# Patient Record
Sex: Female | Born: 1960 | Race: Black or African American | Hispanic: No | Marital: Single | Smoking: Never smoker
Health system: Southern US, Community
[De-identification: ages and names within clinical notes are randomized; demographics above are authoritative.]

## PROBLEM LIST (undated history)

## (undated) DIAGNOSIS — D649 Anemia, unspecified: Secondary | ICD-10-CM

## (undated) DIAGNOSIS — I10 Essential (primary) hypertension: Secondary | ICD-10-CM

## (undated) HISTORY — PX: DILATION AND CURETTAGE OF UTERUS: SHX78

---

## 2019-10-28 ENCOUNTER — Other Ambulatory Visit: Payer: Self-pay | Admitting: Gerontology

## 2019-10-28 DIAGNOSIS — Z1231 Encounter for screening mammogram for malignant neoplasm of breast: Secondary | ICD-10-CM

## 2021-07-20 ENCOUNTER — Emergency Department
Admission: EM | Admit: 2021-07-20 | Discharge: 2021-07-23 | Disposition: A | Discharge status: Home / Self Care | Payer: Medicare Other | Attending: Internal Medicine | Admitting: Internal Medicine

## 2021-07-20 DIAGNOSIS — N939 Abnormal uterine and vaginal bleeding, unspecified: Secondary | ICD-10-CM | POA: Diagnosis not present

## 2021-07-20 DIAGNOSIS — D259 Leiomyoma of uterus, unspecified: Secondary | ICD-10-CM | POA: Diagnosis present

## 2021-07-20 DIAGNOSIS — D649 Anemia, unspecified: Secondary | ICD-10-CM | POA: Diagnosis not present

## 2021-07-20 DIAGNOSIS — Z6841 Body Mass Index (BMI) 40.0 and over, adult: Secondary | ICD-10-CM

## 2021-07-20 DIAGNOSIS — N179 Acute kidney failure, unspecified: Secondary | ICD-10-CM

## 2021-07-20 DIAGNOSIS — I10 Essential (primary) hypertension: Secondary | ICD-10-CM | POA: Diagnosis not present

## 2021-07-20 DIAGNOSIS — R42 Dizziness and giddiness: Secondary | ICD-10-CM | POA: Diagnosis present

## 2021-07-20 DIAGNOSIS — E875 Hyperkalemia: Secondary | ICD-10-CM

## 2021-07-20 HISTORY — DX: Anemia, unspecified: D64.9

## 2021-07-20 HISTORY — DX: Essential (primary) hypertension: I10

## 2021-07-20 LAB — CBC WITH DIFFERENTIAL/PLATELET
Abs Immature Granulocytes: 0.05 10*3/uL (ref 0.00–0.07)
Abs Immature Granulocytes: 0.03 10*3/uL (ref 0.00–0.07)
Basophils Absolute: 0 10*3/uL (ref 0.0–0.1)
Basophils Absolute: 0 10*3/uL (ref 0.0–0.1)
Basophils Relative: 0 %
Basophils Relative: 0 %
Eosinophils Absolute: 0.1 10*3/uL (ref 0.0–0.5)
Eosinophils Absolute: 0.1 10*3/uL (ref 0.0–0.5)
Eosinophils Relative: 1 %
Eosinophils Relative: 1 %
HCT: 19.3 % — ABNORMAL LOW (ref 36.0–46.0)
HCT: 21.4 % — ABNORMAL LOW (ref 36.0–46.0)
Hemoglobin: 5.5 g/dL — ABNORMAL LOW (ref 12.0–15.0)
Hemoglobin: 6.3 g/dL — ABNORMAL LOW (ref 12.0–15.0)
Immature Granulocytes: 1 %
Immature Granulocytes: 0 %
Lymphocytes Relative: 15 %
Lymphocytes Relative: 21 %
Lymphs Abs: 1.4 10*3/uL (ref 0.7–4.0)
Lymphs Abs: 1.6 10*3/uL (ref 0.7–4.0)
MCH: 21 pg — ABNORMAL LOW (ref 26.0–34.0)
MCH: 22.3 pg — ABNORMAL LOW (ref 26.0–34.0)
MCHC: 28.5 g/dL — ABNORMAL LOW (ref 30.0–36.0)
MCHC: 29.4 g/dL — ABNORMAL LOW (ref 30.0–36.0)
MCV: 73.7 fL — ABNORMAL LOW (ref 80.0–100.0)
MCV: 75.6 fL — ABNORMAL LOW (ref 80.0–100.0)
Monocytes Absolute: 0.5 10*3/uL (ref 0.1–1.0)
Monocytes Absolute: 0.6 10*3/uL (ref 0.1–1.0)
Monocytes Relative: 6 %
Monocytes Relative: 8 %
Neutro Abs: 6.9 10*3/uL (ref 1.7–7.7)
Neutro Abs: 5.1 10*3/uL (ref 1.7–7.7)
Neutrophils Relative %: 77 %
Neutrophils Relative %: 70 %
Platelets: 217 10*3/uL (ref 150–400)
Platelets: 190 10*3/uL (ref 150–400)
RBC: 2.62 MIL/uL — ABNORMAL LOW (ref 3.87–5.11)
RBC: 2.83 MIL/uL — ABNORMAL LOW (ref 3.87–5.11)
RDW: 17.5 % — ABNORMAL HIGH (ref 11.5–15.5)
RDW: 18.6 % — ABNORMAL HIGH (ref 11.5–15.5)
WBC: 9 10*3/uL (ref 4.0–10.5)
WBC: 7.4 10*3/uL (ref 4.0–10.5)
nRBC: 1.7 % — ABNORMAL HIGH (ref 0.0–0.2)
nRBC: 2 % — ABNORMAL HIGH (ref 0.0–0.2)

## 2021-07-20 LAB — BASIC METABOLIC PANEL
Anion gap: 8 (ref 5–15)
BUN: 54 mg/dL — ABNORMAL HIGH (ref 6–20)
CO2: 20 mmol/L — ABNORMAL LOW (ref 22–32)
Calcium: 8.6 mg/dL — ABNORMAL LOW (ref 8.9–10.3)
Chloride: 108 mmol/L (ref 98–111)
Creatinine, Ser: 1.76 mg/dL — ABNORMAL HIGH (ref 0.44–1.00)
GFR, Estimated: 33 mL/min — ABNORMAL LOW (ref >60)
Glucose, Bld: 101 mg/dL — ABNORMAL HIGH (ref 70–99)
Potassium: 5.2 mmol/L — ABNORMAL HIGH (ref 3.5–5.1)
Sodium: 136 mmol/L (ref 135–145)

## 2021-07-20 LAB — PREPARE RBC (CROSSMATCH)

## 2021-07-20 LAB — ABO/RH: ABO/RH(D): O POS

## 2021-07-20 MED ORDER — SODIUM CHLORIDE 0.9 % IV SOLN
10.0000 mL/h | Freq: Once | INTRAVENOUS | Status: AC
Start: 2021-07-20 — End: 2021-07-20
  Administered 2021-07-20: 10 mL/h via INTRAVENOUS

## 2021-07-20 MED ORDER — SODIUM CHLORIDE 0.9 % IV BOLUS
1000.0000 mL | Freq: Once | INTRAVENOUS | Status: AC
  Administered 2021-07-20: 1000 mL via INTRAVENOUS

## 2021-07-20 NOTE — ED Provider Notes (Signed)
Procedures ? ?Clinical Course as of 07/20/21 1623  ?Wed Jul 20, 2021  ?1300 Reassessed and updated patient of low hemoglobin.  Recommended observation admission to facilitate logistics of blood.  She is agreeable. [DS]  ?[place] from Dr. Blaine Hamper, he had originally excepted the patient for admission, but called me back and is hesitant to admit.  Reports concern regarding her weight and obesity and BMI.  Is not sure if OB/GYN can help with endometrial biopsy while patient is here and questions if she needs transfer.  He agrees to reach out to OB/GYN in consultation [DS]  ?[place] Dr. Ouida Sills has seen the patient and also recommending transfer.  We can treat her medically, but if anything were to happen, if she were to bleed or have any worsening then we would not be able to handle it effectively here with our anesthesia staff.  I will reach out to [facility] OB/GYN. [DS]  ?[place] at [facility] transfer.  [DS]  ?[place].  Educated patient of recommendations for transfer.  She is agreeable.  Waiting for callback from [facility] gynecology [DS]  ?  ?Clinical Course User Index ?[DS] Vladimir Crofts, MD  ? ? ?----------------------------------------- ?4:23 PM on 07/20/2021 ?----------------------------------------- ?Case discussed with Dr. Georgia Lopes of [facility] gynecology who accepts the patient for transfer with a plan for procedure in the OR to perform D&C, hysteroscopy, IUD exchange. ? ? ?  ?Carrie Mew, MD ?07/20/21 1624 ? ?

## 2021-07-20 NOTE — ED Triage Notes (Incomplete)
Pt BIBA for dizziness, weakness and vag bleeding x 1 month. Pt a/ox4, speaking in full and complete sentences ?

## 2021-07-20 NOTE — ED Notes (Signed)
Pt eating food brought by family ? ?

## 2021-07-20 NOTE — ED Notes (Signed)
Pt given blanket per request.  

## 2021-07-20 NOTE — ED Notes (Signed)
Bariatric  bed  ordered per  charge  nurse  Janett Billow RN ?

## 2021-07-20 NOTE — ED Provider Notes (Signed)
? ?[facility] ?Provider Note ? ? ? Event Date/Time  ? First MD Initiated Contact with Patient 07/20/21 1231   ?  (approximate) ? ? ?History  ? ?Dizziness ? ? ?HPI ? ?Krystal Francis is a 61 y.o. female who presents to the ED for evaluation of Dizziness ?  ?I review outpatient OB visit from in January.  Seen for abnormal uterine bleeding and had a pelvic ultrasound.  I review this ultrasound with thickened endometrial stripe and heterogenous uterus with at least 1 submucosal fibroid. ?Patient with super morbid obesity with a BMI greater than 70.  Largely bedbound, does ambulate some with a rollator at home.  She lives at home by herself. ? ?She presents to the ED via EMS for evaluation of chronic weakness.  She reports feeling weak for greater than a month and that she only sometimes takes her iron supplementation.  She reports that she has been "putting it off for a while, but I thought I would get checked out today."  She reports having follow-up on Friday with OB/GYN for endometrial biopsy.  ? ?Reports continued passage of vaginal clots that has been present for couple months now without any worsening.  Denies any abdominal pain, emesis, fever. ? ?She reports a fall today where she rolled out of bed accidentally, landing on her bilateral knees.  Reports no pain to these areas and says she is fine.  Declines imaging. ? ? ?Physical Exam  ? ?Triage Vital Signs: ?ED Triage Vitals  ?Enc Vitals Group  ?   BP   ?   Pulse   ?   Resp   ?   Temp   ?   Temp src   ?   SpO2   ?   Weight   ?   Height   ?   Head Circumference   ?   Peak Flow   ?   Pain Score   ?   Pain Loc   ?   Pain Edu?   ?   Excl. in [place]?   ? ? ?Most recent vital signs: ?Vitals:  ? 07/20/21 1454 07/20/21 1518  ?BP: 105/84 (!) 99/56  ?Pulse: 80 84  ?Resp: 18 18  ?Temp: 97.9 ?F (36.6 ?C) 97.6 ?F (36.4 ?C)  ?SpO2: 100% 99%  ? ? ?General: Awake, no distress.  Morbidly obese and bedbound. ?CV:  Good peripheral perfusion.  ?Resp:  Normal effort.   ?Abd:  No distention.  ?MSK:  No deformity noted.  No signs of trauma.  No impaired range of motion. ?Neuro:  No focal deficits appreciated. ?Other:   ? ? ?ED Results / Procedures / Treatments  ? ?Labs ?(all labs ordered are listed, but only abnormal results are displayed) ?Labs Reviewed  ?CBC WITH DIFFERENTIAL/PLATELET - Abnormal; Notable for the following components:  ?    Result Value  ? RBC 2.62 (*)   ? Hemoglobin 5.5 (*)   ? HCT 19.3 (*)   ? MCV 73.7 (*)   ? MCH 21.0 (*)   ? MCHC 28.5 (*)   ? RDW 17.5 (*)   ? nRBC 1.7 (*)   ? All other components within normal limits  ?BASIC METABOLIC PANEL - Abnormal; Notable for the following components:  ? Potassium 5.2 (*)   ? CO2 20 (*)   ? Glucose, Bld 101 (*)   ? BUN 54 (*)   ? Creatinine, Ser 1.76 (*)   ? Calcium 8.6 (*)   ? GFR, Estimated  33 (*)   ? All other components within normal limits  ?TYPE AND SCREEN  ?PREPARE RBC (CROSSMATCH)  ?ABO/RH  ? ? ?EKG ? ? ?RADIOLOGY ? ? ?Official radiology report(s): ?No results found. ? ?PROCEDURES and INTERVENTIONS: ? ?.1-3 Lead EKG Interpretation ?Performed by: Vladimir Crofts, MD ?Authorized by: Vladimir Crofts, MD  ? ?  Interpretation: normal   ?  ECG rate:  80 ?  ECG rate assessment: normal   ?  Rhythm: sinus rhythm   ?  Ectopy: none   ?  Conduction: normal   ?.Critical Care ?Performed by: Vladimir Crofts, MD ?Authorized by: Vladimir Crofts, MD  ? ?Critical care provider statement:  ?  Critical care time (minutes):  75 ?  Critical care time was exclusive of:  Separately billable procedures and treating other patients ?  Critical care was necessary to treat or prevent imminent or life-threatening deterioration of the following conditions:  Circulatory failure ?  Critical care was time spent personally by me on the following activities:  Development of treatment plan with patient or surrogate, discussions with consultants, evaluation of patient's response to treatment, examination of patient, ordering and review of laboratory studies,  ordering and review of radiographic studies, ordering and performing treatments and interventions, pulse oximetry, re-evaluation of patient's condition and review of old charts ? ?Medications  ?0.9 %  sodium chloride infusion (has no administration in time range)  ?sodium chloride 0.9 % bolus 1,000 mL (1,000 mLs Intravenous New Bag/Given 07/20/21 1326)  ? ? ? ?IMPRESSION / MDM / ASSESSMENT AND PLAN / ED COURSE  ?I reviewed the triage vital signs and the nursing notes. ? ?61 year old female with super morbid obesity presents to the ED with chronic weakness requesting blood count evaluation in the setting of chronic abnormal uterine bleeding with known endometrial pathology.  Her vitals are normal on room air.  No signs of trauma, distress, neurologic or vascular deficits.  We will obtain basic screening labs to ensure no significant anemia. ? ?Labs returned with significant microcytic anemia with hemoglobin of 5.5, and evidence of prerenal AKI.  She is started on IV fluids and blood product resuscitation.  No active bleeding here in the ED.  I had originally spoke with hospitalist for medical admission here, but they, OB/GYN and anesthesia report discomfort with the patient here as if she were to worsen or need operative intervention then the anesthesia would be unaccustomed to dealing with someone with a BMI of 85.  They recommend transfer to [facility] gynecology. ? ?Clinical Course as of 07/20/21 1525  ?Wed Jul 20, 2021  ?1300 Reassessed and updated patient of low hemoglobin.  Recommended observation admission to facilitate logistics of blood.  She is agreeable. [DS]  ?[place] from Dr. Blaine Hamper, he had originally accepted the patient for admission, but called me back and is hesitant to admit.  Reports concern regarding her weight and obesity and BMI.  Is not sure if OB/GYN can help with endometrial biopsy while patient is here and questions if she needs transfer.  He agrees to reach out to OB/GYN in consultation [DS]   ?[place] Dr. Ouida Sills has seen the patient and also recommending transfer.  We can treat her medically, but if anything were to happen, if she were to bleed or have any worsening then we would not be able to handle it effectively here with our anesthesia staff.  I will reach out to [facility] OB/GYN. [DS]  ?[place] at [facility] transfer.  [DS]  ?[place].  Educated patient of recommendations for transfer.  She is agreeable.  Waiting for callback from [facility] gynecology [DS]  ?  ?Clinical Course User Index ?[DS] Vladimir Crofts, MD  ? ? ? ?FINAL CLINICAL IMPRESSION(S) / ED DIAGNOSES  ? ?Final diagnoses:  ?Symptomatic anemia  ?AKI (acute kidney injury) ([place])  ?Abnormal uterine bleeding (AUB)  ? ? ? ?Rx / DC Orders  ? ?ED Discharge Orders   ? ? None  ? ?  ? ? ? ?Note:  This document was prepared using Dragon voice recognition software and may include unintentional dictation errors. ?  ?Vladimir Crofts, MD ?07/20/21 1529 ? ?

## 2021-07-20 NOTE — ED Notes (Signed)
Pt given blanket.

## 2021-07-20 NOTE — Consult Note (Signed)
?  ? ? ?          Medical Consultation ? ? ?Krystal Francis  LNL:[id number]  DOB: [birth year]  DOA: 07/20/2021 ? ?PCP: System, Provider Not In  ? ?Outpatient Specialists:  ? ? ?Requesting physician: ?-Dr. Tamala Julian of ED ? ?Reason for consultation: ?-Symptomatic anemia ? ?History of Present Illness: ?Krystal Francis is an 61 y.o. female with PMH of anemia, morbid obesity with BMI 85.82, uterine fibroid, who presents with dizziness and weakness ? ?Pt states that she had vaginal bleeding which lasted for about 1 month and stopped approximately 1 week ago.  Currently patient is not having vaginal bleeding.  She developed dizziness, lightheadedness, weakness and mild shortness breath.  She has mild dry cough, no chest pain, fever or chills.  No nausea, vomiting, diarrhea or abdominal pain.  No symptoms of UTI.  Of note, patient was seen by OB/GYN at [facility]. She had pelvic ultrasounds 06/13/2020, which showed at least one 3.5 cm submucosal fibroid, with endometrial thickness of 48m. She was scheduled for D+C and probable myosure resection for submucosal fibroid  ? ?Date reviewed, lab, image and vitals: hemoglobin dropped from 10.3 on 03/30/21 to 5.5.  WBC 9.0, AKI with creatinine 1.76, BUN 54, GFR 33, (baseline creatinine 0.8 on 03/30/2021), mild hyperkalemia with potassium 5.2, temperature normal, blood pressure 115/58, heart rate 75, RR 20, oxygen saturation 100% on room air.  [facility]. SOuida Sillsof OB/GYN is consulted. ? ? ?EKG: Not done in ED. ? ?Review of Systems:  ? ?General: no fevers, chills, no changes in body weight, no changes in appetite. Has dizziness and generalized weakness  ?See skin: no rash ?HEENT: no blurry vision, hearing changes or sore throat ?Pulm: has dyspnea, coughing, no wheezing ?CV: no chest pain, palpitations, shortness of breath ?Abd: no nausea/vomiting, abdominal pain, diarrhea/constipation ?GU: no dysuria, hematuria, polyuria ?Ext: no arthralgias, myalgias ?Neuro: no  weakness, numbness, or tingling ? ? ? ?Past Medical History: ?Past Medical History:  ?Diagnosis Date  ? Anemia, unspecified   ? Hypertension   ? ? ?Past Surgical History: ?History reviewed. No pertinent surgical history. ? ? ?Allergies: ? Not on File ? ? ?Social History: ? reports that she has never smoked. She has never used smokeless tobacco. She reports that she does not drink alcohol and does not use drugs. ? ?Family History: ?History reviewed. No pertinent family history. ? ? ?Physical Exam: ?Vitals:  ? 07/20/21 1241 07/20/21 1454 07/20/21 1518 07/20/21 1600  ?BP: (!) 115/58 105/84 (!) 99/56 (!) 109/47  ?Pulse: 75 80 84 80  ?Resp: '20 18 18 '$ (!) 22  ?Temp: 97.9 ?F (36.6 ?C) 97.9 ?F (36.6 ?C) 97.6 ?F (36.4 ?C)   ?TempSrc: Oral Oral Oral   ?SpO2: 100% 100% 99% 98%  ?Weight: (!) 226.8 kg     ?Height: '5\' 4"'$  (1.626 m)     ? ? ? ?General: Not in acute distress. Pale looking. ?HEENT: ?      Eyes: PERRL, EOMI, no scleral icterus. ?      ENT: No discharge from the ears and nose, no pharynx injection, no tonsillar enlargement.  ?      Neck: No JVD, no bruit, no mass felt. ?Heme: No neck lymph node enlargement. ?Cardiac: S1/S2, RRR, No murmurs, No gallops or rubs. ?Respiratory: No rales, wheezing, rhonchi or rubs. ?GI: Soft, nondistended, nontender, no rebound pain, no organomegaly, BS present. ?GU: No hematuria ?Ext: No pitting leg edema bilaterally. 1+DP/PT pulse bilaterally. ?Musculoskeletal: No  joint deformities, No joint redness or warmth, no limitation of ROM in spin. ?Skin: No rashes.  ?Neuro: Alert, oriented X3, cranial nerves II-XII grossly intact, moves all extremities normally. ?Psych: Patient is not psychotic, no suicidal or hemocidal ideation. ? ? ? ?Data reviewed:  I have personally reviewed following labs and imaging studies ?Labs:  ?CBC: ?Recent Labs  ?Lab 07/20/21 ?1234  ?WBC 9.0  ?NEUTROABS 6.9  ?HGB 5.5*  ?HCT 19.3*  ?MCV 73.7*  ?PLT 217  ? ? ?Basic Metabolic Panel: ?Recent Labs  ?Lab 07/20/21 ?1234  ?NA  136  ?K 5.2*  ?CL 108  ?CO2 20*  ?GLUCOSE 101*  ?BUN 54*  ?CREATININE 1.76*  ?CALCIUM 8.6*  ? ?GFR ?Estimated Creatinine Clearance: 67.1 mL/min (A) (by C-G formula based on SCr of 1.76 mg/dL (H)). ?Liver Function Tests: ?No results for input(s): AST, ALT, ALKPHOS, BILITOT, PROT, ALBUMIN in the last 168 hours. ?No results for input(s): LIPASE, AMYLASE in the last 168 hours. ?No results for input(s): AMMONIA in the last 168 hours. ?Coagulation profile ?No results for input(s): INR, PROTIME in the last 168 hours. ? ?Cardiac Enzymes: ?No results for input(s): CKTOTAL, CKMB, CKMBINDEX, TROPONINI in the last 168 hours. ?BNP: ?Invalid input(s): POCBNP ?CBG: ?No results for input(s): GLUCAP in the last 168 hours. ?D-Dimer ?No results for input(s): DDIMER in the last 72 hours. ?Hgb A1c ?No results for input(s): HGBA1C in the last 72 hours. ?Lipid Profile ?No results for input(s): CHOL, HDL, LDLCALC, TRIG, CHOLHDL, LDLDIRECT in the last 72 hours. ?Thyroid function studies ?No results for input(s): TSH, T4TOTAL, T3FREE, THYROIDAB in the last 72 hours. ? ?Invalid input(s): FREET3 ?Anemia work up ?No results for input(s): VITAMINB12, FOLATE, FERRITIN, TIBC, IRON, RETICCTPCT in the last 72 hours. ?Urinalysis ?No results found for: COLORURINE, APPEARANCEUR, [place], [place], [place], [place], [place], [place], [place], [place], NITRITE, LEUKOCYTESUR ? ? ?Microbiology ?No results found for this or any previous visit (from the past 240 hour(s)). ? ? ? ? ?Inpatient Medications:   ?Scheduled Meds: ? ?Continuous Infusions: ? sodium chloride    ? ? ? ?Radiological Exams on Admission: ?No results found. ? ?Impression/Recommendations ?Principal Problem: ?  Vaginal bleeding ?Active Problems: ?  Symptomatic anemia ?  Morbid obesity with body mass index of 70 and over in adult [facility]) ?  AKI (acute kidney injury) ([place]) ?  HTN (hypertension) ?  Hyperkalemia ?  Fibroid, uterine ? ? ?Vaginal bleeding due to uterine fibroid: pt is  scheduled for D+C and probable myosure resection for submucosal fibroid by Ob/Gyn in [place]. I consulted Dr. Ouida Sills of Ob/Gyn who recommend 1 unit blood transfusion and transfer back to [facility] where her surgery can be done more safely. Dr. Tamala Julian of Ed has agreed with this plan. ? ?-pt will be transfused with 1 unit of blood in ED ?-IV fluid: 1 L normal saline was given ?-Transfer to [facility] ? ?Symptomatic anemia: Hemoglobin dropped from 10.3-5.5 ?-Patient will be transfused with 1 unit of blood ?-Further treatment will be done in [facility] ? ?Morbid obesity with body mass index of 70 and over in adult [facility]) ?-Diet and exercise.   ?-Encouraged to lose weight.  ? ?AKI (acute kidney injury) ([place]): ?-Recommend to hold blood pressure medication Lotensin, spironolactone-HCTZ ?-IV fluid: 1 L normal saline was given ? ?HTN (hypertension) ?-may start amlodipine 10 mg daily while holding Lotensin, spironolactone-HCTZ ?-Can be treated with as needed IV hydralazine ? ?Hyperkalemia: Potassium 5.2, mildly elevated ?-Patient was given 1 L normal saline in ED. ?-Follow-up with BMP ? ? ? ? ?  Thank you for this consultation.  Our [facility] hospitalist team will follow the patient with you. ? ?Time Spent: ? 35 min ? ? ? ? ?Ivor Costa M.D. ?[facility] ?07/20/2021, 5:09 PM ? ? ? ? ? ? ?  ?

## 2021-07-20 NOTE — ED Triage Notes (Signed)
Pt BIBA for dizziness, weakness and vag bleeding x 1 month. Pt a/ox4, speaking in full and complete sentences, states she is worried her iron is low because shes anemic.  ?

## 2021-07-20 NOTE — ED Notes (Signed)
[facility]  TRANSFER  [facility]  CALLED  PER  DR Charlsie Quest MD ?

## 2021-07-20 NOTE — ED Notes (Signed)
[place]  pt  on  wait list for  bed  informed Matt  RN and DR  Joni Fears  MD ?

## 2021-07-20 NOTE — Consult Note (Signed)
?Consult History and Physical  ? ?SERVICE: Gynecology unassigned patient ? ? ?Patient Name: Krystal Francis ?Patient MRN:   [id number] ? ?CC: fatigue and low energy ? ?HPI: Krystal Francis is a 61 y.o. No obstetric history on file. with known PMB and seen at [facility] and was scheduled for D+C and probable myosure resection for a 3.5 cm submucosal fibroid presented today with fatigue and lethargy . She had PMB for many months . Last episode 10 days ago .  ?Currently on Provera  ? ? ? ?Review of Systems: positives in bold ?GEN:   fevers, chills, weight changes, appetite changes, fatigue, night sweats, weight gain  ?HEENT:  HA, vision changes, hearing loss, congestion, rhinorrhea, sinus pressure, dysphagia ?CV:   CP, palpitations ?PULM:  SOB, cough ?GI:  abd pain, N/V/D/C ?GU:  dysuria, urgency, frequency+ PMB ?MSK:  arthralgias, myalgias, back pain, swelling ?SKIN:  rashes, color changes, pallor ?NEURO:  numbness, weakness, tingling, seizures, dizziness, tremors ?PSYCH:  depression, anxiety, behavioral problems, confusion  ?HEME/LYMPH:  easy bruising or bleeding ?ENDO:  heat/cold intolerance ? ?Past Obstetrical History: ?OB History   ?No obstetric history on file. ?  ? ? ?Past Gynecologic History: ?Menopasual for 7+ yrs ?Past Medical History: ?Past Medical History:  ?Diagnosis Date  ? Anemia, unspecified   ? Hypertension   ?Morbid obesity ? ?Past Surgical History: ? History reviewed. No pertinent surgical history. ? ?Family History:  ?family history is not on file. ? ?Social History:  ?Social History  ? ?Socioeconomic History  ? Marital status: Single  ?  Spouse name: Not on file  ? Number of children: Not on file  ? Years of education: Not on file  ? Highest education level: Not on file  ?Occupational History  ? Not on file  ?Tobacco Use  ? Smoking status: Never  ? Smokeless tobacco: Never  ?Vaping Use  ? Vaping Use: Never used  ?Substance and Sexual Activity  ? Alcohol use: Never  ? Drug use: Never  ? Sexual activity: Never   ?Other Topics Concern  ? Not on file  ?Social History Narrative  ? Not on file  ? ?Social Determinants of Health  ? ?Financial Resource Strain: Not on file  ?Food Insecurity: Not on file  ?Transportation Needs: Not on file  ?Physical Activity: Not on file  ?Stress: Not on file  ?Social Connections: Not on file  ?Intimate Partner Violence: Not on file  ? ? ?Home Medications:  ?Medications reconciled in EPIC  ?No current facility-administered medications on file prior to encounter.  ? ?Current Outpatient Medications on File Prior to Encounter  ?Medication Sig Dispense Refill  ? benazepril (LOTENSIN) 40 MG tablet Take 40 mg by mouth daily.    ? magnesium oxide (MAG-OX) 400 (240 Mg) MG tablet Take 1 tablet by mouth daily.    ? medroxyPROGESTERone (PROVERA) 10 MG tablet Take 10 mg by mouth daily.    ? spironolactone-hydrochlorothiazide (ALDACTAZIDE) 25-25 MG tablet Take 1 tablet by mouth daily.    ? vitamin B-12 (CYANOCOBALAMIN) 1000 MCG tablet Take by mouth.    ? ? ?Allergies:  ?Not on File ? ?Physical Exam:  ?Temp:  [97.9 ?F (36.6 ?C)] 97.9 ?F (36.6 ?C) (03/22 1454) ?Pulse Rate:  [75-80] 80 (03/22 1454) ?Resp:  [18-20] 18 (03/22 1454) ?BP: (105-115)/(58-84) 105/84 (03/22 1454) ?SpO2:  [100 %] 100 % (03/22 1454) ?Weight:  [226.8 kg] 226.8 kg (03/22 1241) ? ? ?General Appearance:  Well developed, well nourished, no acute distress, alert and oriented x3 ?HEENT:  Normocephalic atraumatic, extraocular movements intact, moist mucous membranes ?Cardiovascular:  Normal S1/S2, regular rate and rhythm, no murmurs ?Pulmonary:  clear to auscultation, no wheezes, rales or rhonchi, symmetric air entry, good air exchange ? ? ?Labs/Studies:  ? ?CBC and Coags:  ?Lab Results  ?Component Value Date  ? WBC 9.0 07/20/2021  ? NEUTOPHILPCT 77 07/20/2021  ? EOSPCT 1 07/20/2021  ? BASOPCT 0 07/20/2021  ? LYMPHOPCT 15 07/20/2021  ? HGB 5.5 (L) 07/20/2021  ? HCT 19.3 (L) 07/20/2021  ? MCV 73.7 (L) 07/20/2021  ? PLT 217 07/20/2021  ? ?CMP:  ?Lab  Results  ?Component Value Date  ? NA 136 07/20/2021  ? K 5.2 (H) 07/20/2021  ? CL 108 07/20/2021  ? CO2 20 (L) 07/20/2021  ? BUN 54 (H) 07/20/2021  ? CREATININE 1.76 (H) 07/20/2021  ? ? ?Other Imaging: ?No results found. ? ? ?Assessment / Plan:  ? ?Krystal Francis is a 61 y.o. No obstetric history on file. who presents with fatigue  ? ?1. Symptomatic anemia due to PMB and submucosal fibroid ?Given her weight issues Anesthesiology , Dr Andree Elk , would not want to perform GETA  given limitations of the hospital. I recommend 1 unit blood transfusion and transfer back to [facility] where her surgery can be done more safely .  ? ?Dr Tamala Julian ED attending is in agreement  ? ?Thank you for the opportunity to be involved with this pt's care.   ?

## 2021-07-20 NOTE — ED Notes (Addendum)
Pt seen in room AAOx4. Respi even-unlabored. 2nd unit blood transfusion ongoing.  Patient in a bariatric bed. Per reports plan to transfer to [facility] for Ob-gyne management. Pending bed placement ?

## 2021-07-21 DIAGNOSIS — N939 Abnormal uterine and vaginal bleeding, unspecified: Secondary | ICD-10-CM

## 2021-07-21 LAB — BASIC METABOLIC PANEL
Anion gap: 7 (ref 5–15)
BUN: 44 mg/dL — ABNORMAL HIGH (ref 6–20)
CO2: 21 mmol/L — ABNORMAL LOW (ref 22–32)
Calcium: 8.5 mg/dL — ABNORMAL LOW (ref 8.9–10.3)
Chloride: 110 mmol/L (ref 98–111)
Creatinine, Ser: 1.37 mg/dL — ABNORMAL HIGH (ref 0.44–1.00)
GFR, Estimated: 44 mL/min — ABNORMAL LOW (ref >60)
Glucose, Bld: 94 mg/dL (ref 70–99)
Potassium: 4.8 mmol/L (ref 3.5–5.1)
Sodium: 138 mmol/L (ref 135–145)

## 2021-07-21 LAB — CBC WITH DIFFERENTIAL/PLATELET
Abs Immature Granulocytes: 0.02 10*3/uL (ref 0.00–0.07)
Basophils Absolute: 0 10*3/uL (ref 0.0–0.1)
Basophils Relative: 0 %
Eosinophils Absolute: 0.1 10*3/uL (ref 0.0–0.5)
Eosinophils Relative: 1 %
HCT: 25 % — ABNORMAL LOW (ref 36.0–46.0)
Hemoglobin: 7.6 g/dL — ABNORMAL LOW (ref 12.0–15.0)
Immature Granulocytes: 0 %
Lymphocytes Relative: 32 %
Lymphs Abs: 2 10*3/uL (ref 0.7–4.0)
MCH: 23.7 pg — ABNORMAL LOW (ref 26.0–34.0)
MCHC: 30.4 g/dL (ref 30.0–36.0)
MCV: 77.9 fL — ABNORMAL LOW (ref 80.0–100.0)
Monocytes Absolute: 0.6 10*3/uL (ref 0.1–1.0)
Monocytes Relative: 9 %
Neutro Abs: 3.7 10*3/uL (ref 1.7–7.7)
Neutrophils Relative %: 58 %
Platelets: 176 10*3/uL (ref 150–400)
RBC: 3.21 MIL/uL — ABNORMAL LOW (ref 3.87–5.11)
RDW: 19.6 % — ABNORMAL HIGH (ref 11.5–15.5)
WBC: 6.5 10*3/uL (ref 4.0–10.5)
nRBC: 2.2 % — ABNORMAL HIGH (ref 0.0–0.2)

## 2021-07-21 LAB — PREPARE RBC (CROSSMATCH)

## 2021-07-21 MED ORDER — FERROUS SULFATE 325 (65 FE) MG PO TABS
325.0000 mg | ORAL_TABLET | Freq: Three times a day (TID) | ORAL | Status: DC
Start: 2021-07-21 — End: 2021-07-24
  Administered 2021-07-21 – 2021-07-23 (×6): 325 mg via ORAL
  Filled 2021-07-21 (×6): qty 1

## 2021-07-21 MED ORDER — HYDROCODONE-ACETAMINOPHEN 5-325 MG PO TABS
2.0000 | ORAL_TABLET | Freq: Once | ORAL | Status: AC
  Administered 2021-07-21: 2 via ORAL
  Filled 2021-07-21: qty 2

## 2021-07-21 MED ORDER — SODIUM CHLORIDE 0.9 % IV SOLN
10.0000 mL/h | Freq: Once | INTRAVENOUS | Status: AC
  Administered 2021-07-21: 10 mL/h via INTRAVENOUS

## 2021-07-21 MED ORDER — FERROUS SULFATE 325 (65 FE) MG PO TBEC: 325.0000 mg | DELAYED_RELEASE_TABLET | Freq: Three times a day (TID) | ORAL | 3 refills | Status: AC

## 2021-07-21 NOTE — Hospital Course (Signed)
Taken from prior consult note. ? ?Krystal Francis is an 61 y.o. female with PMH of anemia, morbid obesity with BMI 85.82, uterine fibroid, who presents with dizziness and weakness.  Patient was having significant menorrhagia, no current vaginal bleeding.  Recent pelvic ultrasound with submucosal fibroid and endometrial thickening.  She was scheduled to see her gynecologist tomorrow at [facility]. ? ?On presentation she was hypotensive requiring IV fluid, will treat in improvement in her blood pressure.  She was found to have hemoglobin of 5.5, received blood transfusions and current hemoglobin is 7.4.  She was also found to have AKI which started improving.  She was also having hyperkalemia which improved with IV fluid and holding home ACE inhibitor and spironolactone.   ? ?Currently medically stable.  No bed availability at [facility]. ?Patient already had an appointment tomorrow so need to be discharge so she can go for her appointment at [facility] on 07/22/2021 for further management. ? ?Renal function started improving, hyperkalemia resolved.  Hemoglobin within acceptable range now.  She was also started on iron supplement and should advised to hold home antihypertensives until she sees her physician. ?

## 2021-07-21 NOTE — Assessment & Plan Note (Signed)
No current bleeding.  Patient need to see her gynecologist at [facility], already had an appointment for tomorrow. ?

## 2021-07-21 NOTE — ED Notes (Signed)
Called [facility] to check status of bed per Sraddha due to critical capacity no bed has been assigned as of yet. ?

## 2021-07-21 NOTE — Assessment & Plan Note (Signed)
Initial softer blood pressure.  Now within goal. ?-Keep holding home antihypertensives. ?

## 2021-07-21 NOTE — Assessment & Plan Note (Signed)
Estimated body mass index is 85.82 kg/m? as calculated from the following: ?  Height as of this encounter: '5\' 4"'$  (1.626 m). ?  Weight as of this encounter: 226.8 kg.  ? ?This will complicate overall prognosis. ?

## 2021-07-21 NOTE — ED Notes (Signed)
Meal tray provided to pt. No other needs requested at this time.  ?

## 2021-07-21 NOTE — ED Provider Notes (Signed)
----------------------------------------- ?  4:14 PM on 07/21/2021 ?----------------------------------------- ?Patient evaluated by hospitalist service today and vaginal bleeding appears to have stabilized following transfusion.  Initial plan was for discharge home and follow-up at [facility] tomorrow with OB/GYN, however patient remains weak with significant lymphedema severely limiting her mobility.  She continues to await bed availability at [facility] for transfer at this time. ?  ?Blake Divine, MD ?07/21/21 1615 ? ?

## 2021-07-21 NOTE — ED Notes (Signed)
Per Suanne Marker @ [facility], no beds available right now for pt. Call back in the AM. ?

## 2021-07-21 NOTE — Discharge Instructions (Addendum)
You are being started on iron supplement, please take it as directed. ?Please keep holding all your blood pressure medications and see your doctor in next few days for further recommendations. ?Keep yourself well-hydrated. ?

## 2021-07-21 NOTE — Progress Notes (Signed)
?Progress Note ? ? ?PatientMarland Kitchen Krystal Francis PRF:[id number] DOB: 10-Oct-1960 DOA: 07/20/2021     0 ?DOS: the patient was seen and examined on 07/21/2021 ?  ?Brief hospital course: ?Taken from prior consult note. ? ?Krystal Francis is an 61 y.o. female with PMH of anemia, morbid obesity with BMI 85.82, uterine fibroid, who presents with dizziness and weakness.  Patient was having significant menorrhagia, no current vaginal bleeding.  Recent pelvic ultrasound with submucosal fibroid and endometrial thickening.  She was scheduled to see her gynecologist tomorrow at [facility]. ? ?On presentation she was hypotensive requiring IV fluid, will treat in improvement in her blood pressure.  She was found to have hemoglobin of 5.5, received blood transfusions and current hemoglobin is 7.4.  She was also found to have AKI which started improving.  She was also having hyperkalemia which improved with IV fluid and holding home ACE inhibitor and spironolactone.   ? ?Currently medically stable.  No bed availability at [facility]. ?Patient already had an appointment tomorrow so need to be discharge so she can go for her appointment at [facility] on 07/22/2021 for further management. ? ?Renal function started improving, hyperkalemia resolved.  Hemoglobin within acceptable range now.  She was also started on iron supplement and should advised to hold home antihypertensives until she sees her physician. ? ? ?Assessment and Plan: ?* Vaginal bleeding ?No current bleeding.  Patient need to see her gynecologist at [facility], already had an appointment for tomorrow. ? ?Hyperkalemia ?Resolved. ?-Keep holding home ACE inhibitor and spironolactone. ? ?HTN (hypertension) ?Initial softer blood pressure.  Now within goal. ?-Keep holding home antihypertensives. ? ?AKI (acute kidney injury) ([place]) ?Renal function started improving with IV fluid and holding home antihypertensives which include HCTZ, ACE inhibitor and spironolactone. ?She also received blood transfusions and IV  fluid. ?-Continue to hold home medications. ?-Encourage p.o. hydration ? ?Morbid obesity with body mass index of 70 and over in adult [facility]) ?Estimated body mass index is 85.82 kg/m? as calculated from the following: ?  Height as of this encounter: '5\' 4"'$  (1.626 m). ?  Weight as of this encounter: 226.8 kg.  ? ?This will complicate overall prognosis. ? ?Symptomatic anemia ?Hemoglobin improved to 7.4 after blood transfusion.  No active bleeding. ?-Start her on iron supplement ? ? ?  ? ?Subjective: Patient was seen and examined today.  No complaints.  She seems disappointed that she cannot be transferred that quickly to [facility].  She has an appointment for tomorrow. ? ?Physical Exam: ?Vitals:  ? 07/21/21 0930 07/21/21 1030 07/21/21 1130 07/21/21 1200  ?BP: 117/61 (!) 90/52 124/64 112/68  ?Pulse: 80 82 73 82  ?Resp: (!) 22 20 (!) 22 19  ?Temp:      ?TempSrc:      ?SpO2: 92% 90% 94% 95%  ?Weight:      ?Height:      ? ?General.  Morbidly obese lady, in no acute distress. ?Pulmonary.  Lungs clear bilaterally, normal respiratory effort. ?CV.  Regular rate and rhythm, no JVD, rub or murmur. ?Abdomen.  Soft, nontender, nondistended, BS positive. ?CNS.  Alert and oriented .  No focal neurologic deficit. ?Extremities.  No edema, no cyanosis, pulses intact and symmetrical. ?Psychiatry.  Judgment and insight appears normal. ? ?Data Reviewed: ?I reviewed prior notes and labs. ? ?Family Communication: Discussed with patient ? ?Disposition: ?Status is: Observation ? ?Time spent: 45 minutes ? ?This record has been created using Systems analyst. Errors have been sought and corrected,but may not always be  located. Such creation errors do not reflect on the standard of care. ? ?Author: ?Lorella Nimrod, MD ?07/21/2021 1:39 PM ? ?For on call review [email or web address].  ?

## 2021-07-21 NOTE — Assessment & Plan Note (Signed)
Hemoglobin improved to 7.4 after blood transfusion.  No active bleeding. ?-Start her on iron supplement ?

## 2021-07-21 NOTE — ED Notes (Addendum)
In patient room to discuss discharge. Pt states she is unable to walk on legs with how swollen they are. She would like to speak to provider regarding dc. Attending provider contacted and stated that pt lab work looks good and she can go home and go to [facility] tomorrow for scheduled appointment. This information given to pt who stated that her legs are much more swollen than usual and she doesn't think she can walk and is concerned she will not make it to her scheduled appointment tomorrow. Pt does not drive and relies on family to transport. Charge nurse made aware and spoke to pt about her concerns. Attending provider contacted by charge RN for further discussion with patient.  ?

## 2021-07-21 NOTE — Assessment & Plan Note (Signed)
Renal function started improving with IV fluid and holding home antihypertensives which include HCTZ, ACE inhibitor and spironolactone. ?She also received blood transfusions and IV fluid. ?-Continue to hold home medications. ?-Encourage p.o. hydration ?

## 2021-07-21 NOTE — Assessment & Plan Note (Signed)
Resolved. ?-Keep holding home ACE inhibitor and spironolactone. ?

## 2021-07-22 LAB — CBC WITH DIFFERENTIAL/PLATELET
Abs Immature Granulocytes: 0.03 10*3/uL (ref 0.00–0.07)
Basophils Absolute: 0 10*3/uL (ref 0.0–0.1)
Basophils Relative: 0 %
Eosinophils Absolute: 0.1 10*3/uL (ref 0.0–0.5)
Eosinophils Relative: 2 %
HCT: 28.2 % — ABNORMAL LOW (ref 36.0–46.0)
Hemoglobin: 8.4 g/dL — ABNORMAL LOW (ref 12.0–15.0)
Immature Granulocytes: 0 %
Lymphocytes Relative: 28 %
Lymphs Abs: 2.1 10*3/uL (ref 0.7–4.0)
MCH: 23.5 pg — ABNORMAL LOW (ref 26.0–34.0)
MCHC: 29.8 g/dL — ABNORMAL LOW (ref 30.0–36.0)
MCV: 78.8 fL — ABNORMAL LOW (ref 80.0–100.0)
Monocytes Absolute: 0.7 10*3/uL (ref 0.1–1.0)
Monocytes Relative: 10 %
Neutro Abs: 4.4 10*3/uL (ref 1.7–7.7)
Neutrophils Relative %: 60 %
Platelets: 175 10*3/uL (ref 150–400)
RBC: 3.58 MIL/uL — ABNORMAL LOW (ref 3.87–5.11)
RDW: 20.4 % — ABNORMAL HIGH (ref 11.5–15.5)
WBC: 7.4 10*3/uL (ref 4.0–10.5)
nRBC: 2.4 % — ABNORMAL HIGH (ref 0.0–0.2)

## 2021-07-22 LAB — TYPE AND SCREEN
ABO/RH(D): O POS
Antibody Screen: NEGATIVE
Unit division: 0
Unit division: 0
Unit division: 0
Unit division: 0

## 2021-07-22 LAB — CBC
HCT: 26.6 % — ABNORMAL LOW (ref 36.0–46.0)
Hemoglobin: 7.9 g/dL — ABNORMAL LOW (ref 12.0–15.0)
MCH: 23.4 pg — ABNORMAL LOW (ref 26.0–34.0)
MCHC: 29.7 g/dL — ABNORMAL LOW (ref 30.0–36.0)
MCV: 78.7 fL — ABNORMAL LOW (ref 80.0–100.0)
Platelets: 193 10*3/uL (ref 150–400)
RBC: 3.38 MIL/uL — ABNORMAL LOW (ref 3.87–5.11)
RDW: 20.2 % — ABNORMAL HIGH (ref 11.5–15.5)
WBC: 7.5 10*3/uL (ref 4.0–10.5)
nRBC: 2.1 % — ABNORMAL HIGH (ref 0.0–0.2)

## 2021-07-22 LAB — BASIC METABOLIC PANEL
Anion gap: 7 (ref 5–15)
BUN: 36 mg/dL — ABNORMAL HIGH (ref 6–20)
CO2: 20 mmol/L — ABNORMAL LOW (ref 22–32)
Calcium: 8.7 mg/dL — ABNORMAL LOW (ref 8.9–10.3)
Chloride: 111 mmol/L (ref 98–111)
Creatinine, Ser: 1.24 mg/dL — ABNORMAL HIGH (ref 0.44–1.00)
GFR, Estimated: 50 mL/min — ABNORMAL LOW (ref >60)
Glucose, Bld: 102 mg/dL — ABNORMAL HIGH (ref 70–99)
Potassium: 5 mmol/L (ref 3.5–5.1)
Sodium: 138 mmol/L (ref 135–145)

## 2021-07-22 LAB — MAGNESIUM: Magnesium: 2.4 mg/dL (ref 1.7–2.4)

## 2021-07-22 LAB — BPAM RBC
Blood Product Expiration Date: 202304242359
Blood Product Expiration Date: 202304252359
Blood Product Expiration Date: 202304252359
Blood Product Expiration Date: 202304252359
ISSUE DATE / TIME: 202303221446
ISSUE DATE / TIME: 202303221805
ISSUE DATE / TIME: 202303230112
ISSUE DATE / TIME: 202303230513
Unit Type and Rh: 5100
Unit Type and Rh: 5100
Unit Type and Rh: 5100
Unit Type and Rh: 5100

## 2021-07-22 MED ORDER — HYDROCODONE-ACETAMINOPHEN 5-325 MG PO TABS
1.0000 | ORAL_TABLET | Freq: Four times a day (QID) | ORAL | Status: DC | PRN
  Administered 2021-07-22 – 2021-07-23 (×2): 1 via ORAL
  Filled 2021-07-22 (×2): qty 1

## 2021-07-22 NOTE — ED Notes (Signed)
Spoke w/ [facility] transfer Krystal Francis) no bed available as of yet. [facility] at max capacity per [place]. Pt remains on waitlist ?

## 2021-07-22 NOTE — ED Notes (Signed)
Spoke w/ Shanon Brow at transfer center. Pt is on a waitlist at [facility] for bed. Should have D/C's today and pt may get a bed , but cannot give me a time frame ?

## 2021-07-22 NOTE — ED Notes (Addendum)
Reached out to [place] in regards to update of bed availability, per [place], they are working diligently to find pt a bed and will call when one is available. ?

## 2021-07-22 NOTE — ED Notes (Signed)
Called [facility] for update on pt.   Left on hold. No answer ?

## 2021-07-22 NOTE — ED Notes (Signed)
Pt had incontinence of bowel; pt cleaned and bed linens changed; pt given warm blankets ?

## 2021-07-22 NOTE — ED Provider Notes (Signed)
----------------------------------------- ?  5:55 AM on 07/22/2021 ?----------------------------------------- ? ?I received word from the ED Secretary Juliann Pulse that she has spoken with [facility] and there is no indication the patient will get a bed anytime soon.  However the patient is reportedly scheduled for surgical procedure (presumably a D&C) at 11 AM today.  The [facility] transfer representative recommended that someone call this morning and see if there is any possibility of the patient getting a bed after some of the discharges are done because the OB/GYN is expecting the patient for the surgery but [facility] cannot or will not accept them as a transfer until a bed is assigned. ? ?Unlikely that this issue will be resolved within the next couple of hours but I will pass along the information to the dayshift provider at [facility]. ?  ?Hinda Kehr, MD ?07/22/21 [phone] ? ?

## 2021-07-23 LAB — CBC
HCT: 26.7 % — ABNORMAL LOW (ref 36.0–46.0)
Hemoglobin: 8 g/dL — ABNORMAL LOW (ref 12.0–15.0)
MCH: 23.7 pg — ABNORMAL LOW (ref 26.0–34.0)
MCHC: 30 g/dL (ref 30.0–36.0)
MCV: 79 fL — ABNORMAL LOW (ref 80.0–100.0)
Platelets: 186 10*3/uL (ref 150–400)
RBC: 3.38 MIL/uL — ABNORMAL LOW (ref 3.87–5.11)
RDW: 20.6 % — ABNORMAL HIGH (ref 11.5–15.5)
WBC: 7.4 10*3/uL (ref 4.0–10.5)
nRBC: 1.8 % — ABNORMAL HIGH (ref 0.0–0.2)

## 2021-07-23 NOTE — ED Notes (Addendum)
Called [facility] to check status, per Caryl Asp pt is on the priority list and she said there is a good chance the pt will get a bed today.  ?

## 2021-07-23 NOTE — ED Notes (Signed)
Pt continues to require 2L [place] to maintain SpO2 >92%. Pt states does not wear O2 at home.  ?

## 2021-07-23 NOTE — ED Notes (Signed)
Report given to [facility] RN Moses Manners ?

## 2021-07-23 NOTE — ED Notes (Signed)
[place] checked for completion °

## 2021-07-23 NOTE — ED Notes (Signed)
Spoke w/ Joy at [facility] transfer center. Request an update on this pt . Per Caryl Asp , will bump this pt to get priority bed placement. Likely chance to get one today . If and when has bed assignment,  ED here will set up transport for this Pt  ?

## 2021-07-23 NOTE — ED Notes (Signed)
Pt sleeping in bariatric bed on [place] O2, connected to cardiac monitor. VSS.  ?

## 2021-07-23 NOTE — ED Notes (Signed)
Pt NAD in bed, a/ox4. Pt states all needs being met at this time. Pt denies dizziness, CP, SOB, ABD pain, n/v/d. VSS ?

## 2021-07-23 NOTE — ED Provider Notes (Signed)
----------------------------------------- ?  6:39 AM on 07/23/2021 ?----------------------------------------- ? ?[facility] still does not have a bed for the patient and she was not able to be transferred for her procedure.  She continues to not have any vaginal bleeding.  Labs were ordered for later today to see if her hemoglobin is still downtrending.  She continues to be appropriate for discharge and outpatient follow-up except that she feels she is unable to do so.  Consideration should be given to whether or not she would benefit from a [facility] consult but I am holding out hope that [facility] will come through on a bed assignment. ?  ?Hinda Kehr, MD ?07/23/21 [phone] ? ?

## 2021-09-15 ENCOUNTER — Inpatient Hospital Stay
Admission: EM | Admit: 2021-09-15 | Discharge: 2021-10-13 | DRG: 853 | Disposition: A | Discharge status: Home Health | Payer: Medicare Other | Attending: Internal Medicine | Admitting: Internal Medicine

## 2021-09-15 ENCOUNTER — Other Ambulatory Visit: Payer: Self-pay

## 2021-09-15 DIAGNOSIS — N368 Other specified disorders of urethra: Secondary | ICD-10-CM | POA: Diagnosis not present

## 2021-09-15 DIAGNOSIS — Z833 Family history of diabetes mellitus: Secondary | ICD-10-CM

## 2021-09-15 DIAGNOSIS — J449 Chronic obstructive pulmonary disease, unspecified: Secondary | ICD-10-CM | POA: Diagnosis present

## 2021-09-15 DIAGNOSIS — D75838 Other thrombocytosis: Secondary | ICD-10-CM

## 2021-09-15 DIAGNOSIS — Z6841 Body Mass Index (BMI) 40.0 and over, adult: Secondary | ICD-10-CM

## 2021-09-15 DIAGNOSIS — E876 Hypokalemia: Secondary | ICD-10-CM | POA: Diagnosis not present

## 2021-09-15 DIAGNOSIS — I959 Hypotension, unspecified: Secondary | ICD-10-CM | POA: Diagnosis present

## 2021-09-15 DIAGNOSIS — Z7902 Long term (current) use of antithrombotics/antiplatelets: Secondary | ICD-10-CM

## 2021-09-15 DIAGNOSIS — I82621 Acute embolism and thrombosis of deep veins of right upper extremity: Secondary | ICD-10-CM | POA: Diagnosis present

## 2021-09-15 DIAGNOSIS — B9562 Methicillin resistant Staphylococcus aureus infection as the cause of diseases classified elsewhere: Secondary | ICD-10-CM | POA: Diagnosis present

## 2021-09-15 DIAGNOSIS — Z515 Encounter for palliative care: Secondary | ICD-10-CM

## 2021-09-15 DIAGNOSIS — D62 Acute posthemorrhagic anemia: Secondary | ICD-10-CM | POA: Diagnosis present

## 2021-09-15 DIAGNOSIS — J15212 Pneumonia due to Methicillin resistant Staphylococcus aureus: Secondary | ICD-10-CM | POA: Diagnosis present

## 2021-09-15 DIAGNOSIS — A4102 Sepsis due to Methicillin resistant Staphylococcus aureus: Principal | ICD-10-CM | POA: Diagnosis present

## 2021-09-15 DIAGNOSIS — Z86718 Personal history of other venous thrombosis and embolism: Secondary | ICD-10-CM

## 2021-09-15 DIAGNOSIS — D250 Submucous leiomyoma of uterus: Secondary | ICD-10-CM | POA: Diagnosis present

## 2021-09-15 DIAGNOSIS — E871 Hypo-osmolality and hyponatremia: Secondary | ICD-10-CM | POA: Diagnosis present

## 2021-09-15 DIAGNOSIS — N950 Postmenopausal bleeding: Secondary | ICD-10-CM | POA: Diagnosis present

## 2021-09-15 DIAGNOSIS — I50812 Chronic right heart failure: Secondary | ICD-10-CM | POA: Diagnosis present

## 2021-09-15 DIAGNOSIS — J9601 Acute respiratory failure with hypoxia: Secondary | ICD-10-CM | POA: Diagnosis present

## 2021-09-15 DIAGNOSIS — Z79899 Other long term (current) drug therapy: Secondary | ICD-10-CM

## 2021-09-15 DIAGNOSIS — I82721 Chronic embolism and thrombosis of deep veins of right upper extremity: Secondary | ICD-10-CM

## 2021-09-15 DIAGNOSIS — K219 Gastro-esophageal reflux disease without esophagitis: Secondary | ICD-10-CM | POA: Diagnosis present

## 2021-09-15 DIAGNOSIS — I2721 Secondary pulmonary arterial hypertension: Secondary | ICD-10-CM | POA: Diagnosis present

## 2021-09-15 DIAGNOSIS — D509 Iron deficiency anemia, unspecified: Secondary | ICD-10-CM | POA: Diagnosis present

## 2021-09-15 DIAGNOSIS — R339 Retention of urine, unspecified: Secondary | ICD-10-CM | POA: Diagnosis present

## 2021-09-15 DIAGNOSIS — R7881 Bacteremia: Secondary | ICD-10-CM

## 2021-09-15 DIAGNOSIS — I76 Septic arterial embolism: Secondary | ICD-10-CM | POA: Diagnosis present

## 2021-09-15 DIAGNOSIS — I739 Peripheral vascular disease, unspecified: Secondary | ICD-10-CM | POA: Diagnosis present

## 2021-09-15 DIAGNOSIS — R6520 Severe sepsis without septic shock: Secondary | ICD-10-CM | POA: Diagnosis present

## 2021-09-15 DIAGNOSIS — R310 Gross hematuria: Secondary | ICD-10-CM | POA: Diagnosis present

## 2021-09-15 DIAGNOSIS — I10 Essential (primary) hypertension: Secondary | ICD-10-CM | POA: Diagnosis present

## 2021-09-15 DIAGNOSIS — L253 Unspecified contact dermatitis due to other chemical products: Secondary | ICD-10-CM | POA: Diagnosis not present

## 2021-09-15 DIAGNOSIS — Z86711 Personal history of pulmonary embolism: Secondary | ICD-10-CM

## 2021-09-15 DIAGNOSIS — I2699 Other pulmonary embolism without acute cor pulmonale: Secondary | ICD-10-CM | POA: Diagnosis present

## 2021-09-15 DIAGNOSIS — I110 Hypertensive heart disease with heart failure: Secondary | ICD-10-CM | POA: Diagnosis present

## 2021-09-15 DIAGNOSIS — J189 Pneumonia, unspecified organism: Secondary | ICD-10-CM

## 2021-09-15 DIAGNOSIS — R319 Hematuria, unspecified: Secondary | ICD-10-CM | POA: Diagnosis present

## 2021-09-15 DIAGNOSIS — Z20822 Contact with and (suspected) exposure to covid-19: Secondary | ICD-10-CM | POA: Diagnosis present

## 2021-09-15 DIAGNOSIS — I50810 Right heart failure, unspecified: Secondary | ICD-10-CM | POA: Diagnosis present

## 2021-09-15 DIAGNOSIS — Z7189 Other specified counseling: Secondary | ICD-10-CM

## 2021-09-15 DIAGNOSIS — A419 Sepsis, unspecified organism: Secondary | ICD-10-CM

## 2021-09-15 DIAGNOSIS — Z8249 Family history of ischemic heart disease and other diseases of the circulatory system: Secondary | ICD-10-CM

## 2021-09-15 LAB — URINALYSIS, ROUTINE W REFLEX MICROSCOPIC
Bacteria, UA: NONE SEEN
Bilirubin Urine: NEGATIVE
Glucose, UA: NEGATIVE mg/dL
Ketones, ur: NEGATIVE mg/dL
Leukocytes,Ua: NEGATIVE
Nitrite: NEGATIVE
Protein, ur: NEGATIVE mg/dL
Specific Gravity, Urine: 1.02 (ref 1.005–1.030)
pH: 6 (ref 5.0–8.0)

## 2021-09-15 LAB — BASIC METABOLIC PANEL
Anion gap: 13 (ref 5–15)
BUN: 16 mg/dL (ref 6–20)
CO2: 21 mmol/L — ABNORMAL LOW (ref 22–32)
Calcium: 8.8 mg/dL — ABNORMAL LOW (ref 8.9–10.3)
Chloride: 99 mmol/L (ref 98–111)
Creatinine, Ser: 0.62 mg/dL (ref 0.44–1.00)
GFR, Estimated: >60 mL/min (ref >60)
Glucose, Bld: 121 mg/dL — ABNORMAL HIGH (ref 70–99)
Potassium: 4.5 mmol/L (ref 3.5–5.1)
Sodium: 133 mmol/L — ABNORMAL LOW (ref 135–145)

## 2021-09-15 LAB — CBC
HCT: 30.1 % — ABNORMAL LOW (ref 36.0–46.0)
Hemoglobin: 8.6 g/dL — ABNORMAL LOW (ref 12.0–15.0)
MCH: 21.7 pg — ABNORMAL LOW (ref 26.0–34.0)
MCHC: 28.6 g/dL — ABNORMAL LOW (ref 30.0–36.0)
MCV: 76 fL — ABNORMAL LOW (ref 80.0–100.0)
Platelets: 212 10*3/uL (ref 150–400)
RBC: 3.96 MIL/uL (ref 3.87–5.11)
RDW: 21.8 % — ABNORMAL HIGH (ref 11.5–15.5)
WBC: 15 10*3/uL — ABNORMAL HIGH (ref 4.0–10.5)
nRBC: 0 % (ref 0.0–0.2)

## 2021-09-15 MED ORDER — HYDROCODONE-ACETAMINOPHEN 5-325 MG PO TABS
1.0000 | ORAL_TABLET | Freq: Once | ORAL | Status: AC
  Administered 2021-09-15: 1 via ORAL
  Filled 2021-09-15: qty 1

## 2021-09-15 NOTE — ED Triage Notes (Addendum)
Pt via EMS from home. Per EMS, pt had a cath placed on Tuesday and since then pt has been having blood drain in to the cath bag, no urine. Denies any bladder pain. Pt also c/o R arm and L leg pain, pt has a hx of blood clot and anemia. Pt is A&Ox4 and NAD.   Pt is O2 on RA was 87%. Pt wears O2 intermittently at home> Pt placed on 3L [place]

## 2021-09-15 NOTE — ED Notes (Signed)
Pt provided with meal tray.

## 2021-09-15 NOTE — ED Notes (Signed)
Secretary calling for Bariatric bed.

## 2021-09-15 NOTE — ED Notes (Signed)
Pt cleaned and wiped down with wipes. Pt placed on dry pads and moved to hospital bed. Pt has pillows under head, arms and legs

## 2021-09-15 NOTE — ED Notes (Signed)
Spoke to [facility] about bed. Informed pt we are working on getting pt a bigger bed.

## 2021-09-15 NOTE — ED Notes (Signed)
Warm blankets placed on pt. 

## 2021-09-15 NOTE — ED Notes (Signed)
Pt resting with eyes closed.

## 2021-09-15 NOTE — ED Notes (Signed)
Secretary calling for ETA on bed

## 2021-09-15 NOTE — ED Notes (Signed)
Pt was 86% on RA. Pt placed on 3L [place]NC to come up to 95%.

## 2021-09-15 NOTE — ED Provider Notes (Signed)
[facility] Provider Note    Event Date/Time   First MD Initiated Contact with Patient 09/15/21 1340     (approximate)   History   Hematuria   HPI  Krystal Francis is a 61 y.o. female with history of morbid obesity, DVT, PE (on Lovenox), hypertension, lower extremity lymphedema, abnormal uterine bleeding, and anemia who presents with hematuria from her Foley catheter over the last couple of days.  The patient states that the catheter was placed a few days ago due to her obesity and mobility problems and that she has not had any urine output through the catheter, only some bright red blood.  She has had some incontinence and passage of urine around the catheter.  The patient denies any lower abdominal or flank pain, fever, vomiting, or other acute symptoms.  However, she does report left leg pain over the last week which she states is due to her position in her bed.  She denies any injuries     Physical Exam   Triage Vital Signs: ED Triage Vitals  Enc Vitals Group     BP 09/15/21 1323 117/62     Pulse Rate 09/15/21 1323 92     Resp 09/15/21 1323 20     Temp 09/15/21 1323 98.2 F (36.8 C)     Temp Source 09/15/21 1323 Oral     SpO2 09/15/21 1323 97 %     Weight 09/15/21 1321 (!) 500 lb (226.8 kg)     Height 09/15/21 1321 '5\' 4"'$  (1.626 m)     Head Circumference --      Peak Flow --      Pain Score 09/15/21 1320 10     Pain Loc --      Pain Edu? --      Excl. in [place]? --     Most recent vital signs: Vitals:   09/15/21 1323 09/15/21 1512  BP: 117/62   Pulse: 92 87  Resp: 20 (!) 36  Temp: 98.2 F (36.8 C)   SpO2: 97% 100%     General: Alert and oriented, obese, relatively comfortable appearing. CV:  Good peripheral perfusion.  Resp:  Normal effort.  Abd:  Soft and nontender.  No distention.  Other:  Normal external genitalia.  Vaginal vault without blood.  Tiny amount of blood at the urethral meatus with no active bleeding.  No rash or open  wounds.     ED Results / Procedures / Treatments   Labs (all labs ordered are listed, but only abnormal results are displayed) Labs Reviewed  BASIC METABOLIC PANEL - Abnormal; Notable for the following components:      Result Value   Sodium 133 (*)    CO2 21 (*)    Glucose, Bld 121 (*)    Calcium 8.8 (*)    All other components within normal limits  CBC - Abnormal; Notable for the following components:   WBC 15.0 (*)    Hemoglobin 8.6 (*)    HCT 30.1 (*)    MCV 76.0 (*)    MCH 21.7 (*)    MCHC 28.6 (*)    RDW 21.8 (*)    All other components within normal limits  URINALYSIS, ROUTINE W REFLEX MICROSCOPIC     EKG  ED ECG REPORT I, Arta Silence, the attending physician, personally viewed and interpreted this ECG.  Date: 09/15/2021 EKG Time: 1334 Rate: 90 Rhythm: normal sinus rhythm QRS Axis: normal Intervals: normal ST/T Wave abnormalities: Borderline repolarization  abnormality Narrative Interpretation: Nonspecific abnormalities with no evidence of acute ischemia; interpretation limited due to poor EKG baseline    RADIOLOGY    PROCEDURES:  Critical Care performed: No  Procedures   MEDICATIONS ORDERED IN ED: Medications - No data to display   IMPRESSION / MDM / [place] / ED COURSE  I reviewed the triage vital signs and the nursing notes.  61 year old female with history of morbid obesity and other PMH as noted above presents with hematuria in her Foley catheter that was placed a few days ago, with passage of urine around the catheter.  I reviewed the past medical records.  Per discharge summary from 5/4, the patient was recently admitted to [facility] for acute PE and hyperkalemia.  She was discharged on 5/4 on Lovenox.  Previous ED she was admitted to [facility] in March with significant vaginal bleeding requiring transfusion of multiple units of blood, thought to be secondary to submucosal fibroid.  On exam currently, the patient is relatively  comfortable appearing.  Her vital signs are normal.  Abdomen is soft and nontender.  Physical exam is otherwise unremarkable for acute findings.  However, on my initial exam due to the patient's habitus I am unable to perform an adequate pelvic exam.  Differential diagnosis includes, but is not limited to, UTI, irritation due to Foley catheter, other Foley catheter obstruction or malfunction, bladder malignancy, or possible vaginal bleeding.  We will obtain labs, attempt to replace the Foley, and irrigate if needed.  ----------------------------------------- 3:00 PM on 09/15/2021 -----------------------------------------  Lab work-up is unremarkable.  Creatinine is normal.  WBC count is mildly elevated.  Hemoglobin is 8.6, which is consistent with the patient's recent baseline.  ----------------------------------------- 3:55 PM on 09/15/2021 -----------------------------------------  Foley was replaced successfully.  During this I was able to examine the patient's pelvic area including the external vaginal vault, urethra, and perineum.  The patient has no vaginal bleeding.  The blood seem to have been coming from the urethra itself.  As soon as the Foley catheter was replaced, clear urine flowed out.  Overall I suspect that the Foley balloon may have been in the urethra causing some local bleeding but there is no evidence of ongoing hematuria or vaginal bleeding.  We will obtain a urinalysis.  If this is negative, from a medical perspective, the patient does not require admission.  However, the patient states that she is unable to go home as she is unable to get in and out of the bed that was provided for her after her discharge and is unable to complete ADLs.  Therefore we have ordered TOC and PT OT consults.  I signed the patient out to the oncoming ED physician Dr. Quentin Cornwall.   FINAL CLINICAL IMPRESSION(S) / ED DIAGNOSES   Final diagnoses:  Urethral bleeding     Rx / DC Orders   ED  Discharge Orders     None        Note:  This document was prepared using Dragon voice recognition software and may include unintentional dictation errors.    Arta Silence, MD 09/15/21 1558

## 2021-09-15 NOTE — ED Notes (Signed)
Pt needing to be placed on dry pads and linen unable to roll or move pt due to obesity and size of stretcher. Called [facility] to ask for a hospital bed until Bariatric bed arrives.

## 2021-09-15 NOTE — ED Notes (Signed)
Pt has bright red blood in cathter. Pt states there has not been urine in it since it was placed

## 2021-09-15 NOTE — ED Notes (Signed)
Called [facility] again to ask for any type of hospital bed.

## 2021-09-16 MED ORDER — FERROUS SULFATE 325 (65 FE) MG PO TABS
325.0000 mg | ORAL_TABLET | Freq: Every day | ORAL | Status: DC
  Administered 2021-09-17 – 2021-09-18 (×2): 325 mg via ORAL
  Filled 2021-09-16 (×2): qty 1

## 2021-09-16 MED ORDER — HYDROCODONE-ACETAMINOPHEN 5-325 MG PO TABS
1.0000 | ORAL_TABLET | ORAL | Status: DC | PRN
  Administered 2021-09-16 – 2021-09-25 (×8): 1 via ORAL
  Filled 2021-09-16 (×8): qty 1

## 2021-09-16 MED ORDER — PANTOPRAZOLE SODIUM 40 MG PO TBEC
40.0000 mg | DELAYED_RELEASE_TABLET | Freq: Every day | ORAL | Status: DC
  Administered 2021-09-17 – 2021-10-13 (×25): 40 mg via ORAL
  Filled 2021-09-16 (×25): qty 1

## 2021-09-16 MED ORDER — MEDROXYPROGESTERONE ACETATE 10 MG PO TABS
10.0000 mg | ORAL_TABLET | Freq: Two times a day (BID) | ORAL | Status: DC
Start: 2021-09-16 — End: 2021-10-04
  Administered 2021-09-16 – 2021-10-04 (×35): 10 mg via ORAL
  Filled 2021-09-16 (×37): qty 1

## 2021-09-16 MED ORDER — HYDROMORPHONE HCL 1 MG/ML IJ SOLN
1.0000 mg | Freq: Once | INTRAMUSCULAR | Status: AC
  Administered 2021-09-16: 1 mg via INTRAVENOUS
  Filled 2021-09-16: qty 1

## 2021-09-16 MED ORDER — TORSEMIDE 20 MG PO TABS
20.0000 mg | ORAL_TABLET | Freq: Every day | ORAL | Status: DC
  Administered 2021-09-17 – 2021-09-19 (×3): 20 mg via ORAL
  Filled 2021-09-16 (×3): qty 1

## 2021-09-16 MED ORDER — MAGNESIUM OXIDE -MG SUPPLEMENT 400 (240 MG) MG PO TABS
400.0000 mg | ORAL_TABLET | Freq: Every day | ORAL | Status: DC
Start: 2021-09-16 — End: 2021-10-13
  Administered 2021-09-16 – 2021-10-13 (×26): 400 mg via ORAL
  Filled 2021-09-16 (×27): qty 1

## 2021-09-16 MED ORDER — ENOXAPARIN SODIUM 120 MG/0.8ML IJ SOSY
120.0000 mg | PREFILLED_SYRINGE | INTRAMUSCULAR | Status: DC
  Administered 2021-09-16: 120 mg via SUBCUTANEOUS
  Filled 2021-09-16: qty 0.8

## 2021-09-16 NOTE — ED Notes (Signed)
Pt had a full bed bath with assistance of PT and OT and ed rn , x 2 spotys on left buttox skin breakdown dressing applied , patient tolerated rolling . Pain managed,

## 2021-09-16 NOTE — Evaluation (Addendum)
Physical Therapy Evaluation Patient Details Name: Krystal Francis MRN: [id number] DOB: Sep 26, 1960 Today's Date: 09/16/2021  History of Present Illness  Pt is a 61 y/o F who presents with hematuria from foley catheter over the last couple of days. PMH: morbid obesity, DVT, PE on lovenox, HTN, LE lymphadema, abnormal uterine bleeding, anemia  Clinical Impression  Pt seen for PT evaluation with co-tx with OT for pt & therapist safety. Pt reports prior to going to [place] she was mod I with rollator & left [facility] ambulatory with RW but has not been out of bed once home as she was not able to get back into elevated hospital bed. On this date, pt endorses LLE pain with movement that limits mobility. Pt encouraged to attempt rolling & does so to R with +3 assist but unable to tolerate position [place] (<30 seconds) as pt with c/o pain. PT educated pt on how to use bed controls to increase independence with positioning in bed, as well as importance of changing positions every 2 hours to prevent skin breakdown. At this time pt is unsafe to d/c home alone & would benefit from STR upon d/c to maximize independence with functional mobility & reduce fall risk prior to return home.    Pt on supplemental O2 via nasal cannula with SpO2 dropping as low as 87%.   Recommendations for follow up therapy are one component of a multi-disciplinary discharge planning process, led by the attending physician.  Recommendations may be updated based on patient status, additional functional criteria and insurance authorization.  Follow Up Recommendations Skilled nursing-short term rehab (<3 hours/day)    Assistance Recommended at Discharge Frequent or constant Supervision/Assistance  Patient can return home with the following  Two people to help with walking and/or transfers;Two people to help with bathing/dressing/bathroom;Help with stairs or ramp for entrance;Assist for transportation;Assistance with cooking/housework;Direct  supervision/assist for financial management    Equipment Recommendations  (TBD in next venue)  Recommendations for Other Services       Functional Status Assessment Patient has had a recent decline in their functional status and demonstrates the ability to make significant improvements in function in a reasonable and predictable amount of time.     Precautions / Restrictions Precautions Precautions: Fall Restrictions Weight Bearing Restrictions: No      Mobility  Bed Mobility Overal bed mobility: Needs Assistance Bed Mobility: Rolling Rolling: Total assist (+3 assist)              Transfers                        Ambulation/Gait                  Stairs            Wheelchair Mobility    Modified Rankin (Stroke Patients Only)       Balance                                             Pertinent Vitals/Pain Pain Assessment Pain Assessment: Faces Faces Pain Scale: Hurts whole lot Pain Location: LLE with all movement Pain Descriptors / Indicators: Discomfort, Grimacing Pain Intervention(s): Monitored during session, Repositioned, Patient requesting pain meds-RN notified    Home Living Family/patient expects to be discharged to:: Private residence Living Arrangements: Alone Available Help at Discharge: Family;Available PRN/intermittently Type of  Home: Apartment Home Access: Level entry       Home Layout: One level Home Equipment: Conservation officer, nature (2 wheels);Hospital bed;Shower seat      Prior Function               Mobility Comments: Pt reports she was mod I with rollator prior to admission to [facility]. Pt reports she left [facility] ambulatory with RW but once she returned home she was physically unable to get back in bed after getting out so she stayed in bed all the time. Her mother & another family member would come by PRN to assist with peri hygiene & meals.       Hand Dominance         Extremity/Trunk Assessment   Upper Extremity Assessment Upper Extremity Assessment: Generalized weakness    Lower Extremity Assessment Lower Extremity Assessment: Generalized weakness (pt with BLE lymphadema & c/o pain in LLE but none in RLE)       Communication   Communication: No difficulties  Cognition Arousal/Alertness: Awake/alert Behavior During Therapy: WFL for tasks assessed/performed Overall Cognitive Status: Within Functional Limits for tasks assessed                                          General Comments      Exercises     Assessment/Plan    PT Assessment Patient needs continued PT services  PT Problem List Decreased strength;Decreased coordination;Cardiopulmonary status limiting activity;Decreased range of motion;Pain;Decreased activity tolerance;Decreased knowledge of use of DME;Decreased safety awareness;Decreased balance;Obesity;Decreased mobility;Decreased knowledge of precautions       PT Treatment Interventions Therapeutic exercise;DME instruction;Gait training;Balance training;Stair training;Neuromuscular re-education;Modalities;Manual techniques;Functional mobility training;Patient/family education;Therapeutic activities    PT Goals (Current goals can be found in the Care Plan section)  Acute Rehab PT Goals Patient Stated Goal: "to let this pain medicine work" PT Goal Formulation: With patient Time For Goal Achievement: 09/30/21 Potential to Achieve Goals: Good    Frequency Min 2X/week     Co-evaluation PT/OT/SLP Co-Evaluation/Treatment: Yes Reason for Co-Treatment: Complexity of the patient's impairments (multi-system involvement);For patient/therapist safety PT goals addressed during session: Mobility/safety with mobility         AM-PAC PT "6 Clicks" Mobility  Outcome Measure Help needed turning from your back to your side while in a flat bed without using bedrails?: Total Help needed moving from lying on your back to  sitting on the side of a flat bed without using bedrails?: Total Help needed moving to and from a bed to a chair (including a wheelchair)?: Total Help needed standing up from a chair using your arms (e.g., wheelchair or bedside chair)?: Total Help needed to walk in hospital room?: Total Help needed climbing 3-5 steps with a railing? : Total 6 Click Score: 6    End of Session   Activity Tolerance: Patient limited by pain Patient left: in bed;with call bell/phone within reach Nurse Communication: Mobility status PT Visit Diagnosis: Muscle weakness (generalized) (M62.81);Difficulty in walking, not elsewhere classified (R26.2);Other abnormalities of gait and mobility (R26.89);Pain Pain - Right/Left: Left Pain - part of body: Leg    Time: 1157-1228 PT Time Calculation (min) (ACUTE ONLY): 31 min   Charges:   PT Evaluation $PT Eval High Complexity: 1 High          Lavone Nian, PT, DPT 09/16/21, 1:20 PM   [name] 09/16/2021, 1:20 PM

## 2021-09-16 NOTE — Progress Notes (Signed)
Physical Therapy Treatment Patient Details Name: Krystal Francis MRN: [id number] DOB: [birth year] Today's Date: 09/16/2021   History of Present Illness Pt is a 61 y/o F who presents with hematuria from foley catheter over the last couple of days. PMH: morbid obesity, DVT, PE on lovenox, HTN, LE lymphadema, abnormal uterine bleeding, anemia    PT Comments    Pt seen for co-tx with OT as nursing staff required assistance rolling pt in bed. Pt requires +3 assist for rolling L<>R with use of bed rails & ongoing cuing for using bed control independently. Pt tolerates sidelying on both sides with +2-3 assist to allow nurse to perform peri hygiene. Pt also noted to have 2 small skin tears to buttocks & nurse notified. Pt does decline attempting to sit EOB at this time. Will continue to follow pt acutely to address balance, endurance, strengthening & bed mobility.    Recommendations for follow up therapy are one component of a multi-disciplinary discharge planning process, led by the attending physician.  Recommendations may be updated based on patient status, additional functional criteria and insurance authorization.  Follow Up Recommendations  Skilled nursing-short term rehab (<3 hours/day)     Assistance Recommended at Discharge Frequent or constant Supervision/Assistance  Patient can return home with the following Two people to help with walking and/or transfers;Two people to help with bathing/dressing/bathroom;Help with stairs or ramp for entrance;Assist for transportation;Assistance with cooking/housework;Direct supervision/assist for financial management   Equipment Recommendations  None recommended by PT    Recommendations for Other Services       Precautions / Restrictions Precautions Precautions: Fall Restrictions Weight Bearing Restrictions: No     Mobility  Bed Mobility Overal bed mobility: Needs Assistance Bed Mobility: Rolling Rolling: Total assist (+3 assist rolling L<>R with  bed rails, HOB elevated)              Transfers                        Ambulation/Gait                   Stairs             Wheelchair Mobility    Modified Rankin (Stroke Patients Only)       Balance                                            Cognition Arousal/Alertness: Awake/alert Behavior During Therapy: WFL for tasks assessed/performed Overall Cognitive Status: Within Functional Limits for tasks assessed                                          Exercises      General Comments        Pertinent Vitals/Pain Pain Assessment Pain Assessment: Faces Faces Pain Scale: Hurts a little bit Pain Location: LLE with all movement Pain Descriptors / Indicators: Discomfort, Grimacing Pain Intervention(s): RN gave pain meds during session, Premedicated before session    Home Living Family/patient expects to be discharged to:: Private residence Living Arrangements: Alone Available Help at Discharge: Family;Available PRN/intermittently Type of Home: Apartment Home Access: Level entry       Home Layout: One level Home Equipment: Conservation officer, nature (2 wheels);Hospital bed;Shower seat  Prior Function            PT Goals (current goals can now be found in the care plan section) Acute Rehab PT Goals Patient Stated Goal: get better PT Goal Formulation: With patient Time For Goal Achievement: 09/30/21 Potential to Achieve Goals: Good Progress towards PT goals: Progressing toward goals    Frequency    Min 2X/week      PT Plan Current plan remains appropriate    Co-evaluation PT/OT/SLP Co-Evaluation/Treatment: Yes Reason for Co-Treatment: Complexity of the patient's impairments (multi-system involvement);For patient/therapist safety PT goals addressed during session: Mobility/safety with mobility        AM-PAC PT "6 Clicks" Mobility   Outcome Measure  Help needed turning from your back to  your side while in a flat bed without using bedrails?: Total Help needed moving from lying on your back to sitting on the side of a flat bed without using bedrails?: Total Help needed moving to and from a bed to a chair (including a wheelchair)?: Total Help needed standing up from a chair using your arms (e.g., wheelchair or bedside chair)?: Total Help needed to walk in hospital room?: Total Help needed climbing 3-5 steps with a railing? : Total 6 Click Score: 6    End of Session   Activity Tolerance: Patient tolerated treatment well Patient left: in bed;with call bell/phone within reach;with nursing/sitter in room Nurse Communication: Mobility status PT Visit Diagnosis: Muscle weakness (generalized) (M62.81);Difficulty in walking, not elsewhere classified (R26.2);Other abnormalities of gait and mobility (R26.89);Pain Pain - Right/Left: Left Pain - part of body: Leg     Time: 1328-1340 PT Time Calculation (min) (ACUTE ONLY): 12 min  Charges:  $Therapeutic Activity: 8-22 mins                     Lavone Nian, PT, DPT 09/16/21, 1:51 PM    [name] 09/16/2021, 1:49 PM

## 2021-09-16 NOTE — ED Notes (Signed)
Pillows placed under both arms, right arm is swollen, cms intact  to fingers, 2+ radial pulse noted, approx 1+ edema noted to right upper extremity, resps unlabored, nsr on cardiac monitor present, additional ice chips provided, call bell at right side. Pt declines offer for repositioning, foley cath draining amber clear urine. Foley emptied.

## 2021-09-16 NOTE — TOC Initial Note (Addendum)
Transition of Care [facility]) - Initial/Assessment Note    Patient Details  Name: Krystal Francis MRN: [id number] Date of Birth: [birth year]  Transition of Care [facility]) CM/SW Contact:    Anselm Pancoast, RN Phone Number: 09/16/2021, 2:43 PM  Clinical Narrative:                 Spoke to patient who reports she wants to return home with her aide and caregivers but needs a hospital bed lower to the ground. Reports she received a hospital bed after leaving [facility] 2 weeks ago and the bed is to high and she cant get in and out of it. Patient lives alone and reports she is independent in her care with the assistance of her aides. Has all needed equipment. TOC will try and locate DME provider for bed to find another style of bed.   Outreach to [employer] who is checking to see if [employer] was vendor for hospital bed.         Patient Goals and CMS Choice        Expected Discharge Plan and Services                                                Prior Living Arrangements/Services                       Activities of Daily Living      Permission Sought/Granted                  Emotional Assessment              Admission diagnosis:  Blood in Catheter Bag, EMS Patient Active Problem List   Diagnosis Date Noted   Vaginal bleeding 07/20/2021   Symptomatic anemia 07/20/2021   Morbid obesity with body mass index of 70 and over in adult [facility]) 07/20/2021   AKI (acute kidney injury) ([place]) 07/20/2021   HTN (hypertension) 07/20/2021   Hyperkalemia 07/20/2021   Fibroid, uterine 07/20/2021   PCP:  Latanya Maudlin, NP Pharmacy:   [facility] [place] [facility] [address] AT [place] [address] [facility] [place] [address] Phone: [phone] Fax: [phone]     Social Determinants of Health (SDOH) Interventions    Readmission Risk Interventions     View : No data to display.

## 2021-09-16 NOTE — ED Notes (Signed)
Legs adjusted for comfort. Fan adjusted for comfort, po fluids at left side, pt has call bell at right side.

## 2021-09-16 NOTE — Evaluation (Signed)
Occupational Therapy Evaluation Patient Details Name: Krystal Francis MRN: [id number] DOB: 1960/06/30 Today's Date: 09/16/2021   History of Present Illness Pt is a 61 y/o F who presents with hematuria from foley catheter over the last couple of days. PMH: morbid obesity, DVT, PE on lovenox, HTN, LE lymphadema, abnormal uterine bleeding, anemia   Clinical Impression   Krystal Francis was seen for OT/PT co-evaluation this date. Prior to hospital admission, pt was MOD I prior to recent hospitalization ~4 weeks ago, bed bound since return home. Pt lives alone. Pt presents to acute OT demonstrating impaired ADL performance and functional mobility 2/2 decreased activity tolerance, LLE pain, and functional strength deficits. Pt unable tot tolerate mobility with +4, RN delivered pain medication and pt tolerated. Requires MAX A x3 for pericare rolling L+R. Pt would benefit from skilled OT to address noted impairments and functional limitations (see below for any additional details). Upon hospital discharge, recommend STR to maximize pt safety and return to PLOF.    Recommendations for follow up therapy are one component of a multi-disciplinary discharge planning process, led by the attending physician.  Recommendations may be updated based on patient status, additional functional criteria and insurance authorization.   Follow Up Recommendations  Skilled nursing-short term rehab (<3 hours/day)    Assistance Recommended at Discharge Frequent or constant Supervision/Assistance  Patient can return home with the following Two people to help with walking and/or transfers;Two people to help with bathing/dressing/bathroom    Functional Status Assessment  Patient has had a recent decline in their functional status and demonstrates the ability to make significant improvements in function in a reasonable and predictable amount of time.  Equipment Recommendations  Hospital bed    Recommendations for Other Services        Precautions / Restrictions Precautions Precautions: Fall Restrictions Weight Bearing Restrictions: No      Mobility Bed Mobility Overal bed mobility: Needs Assistance Bed Mobility: Rolling Rolling: Total assist (+3 assist rolling L<>R with bed rails, HOB elevated)                              ADL either performed or assessed with clinical judgement   ADL Overall ADL's : Needs assistance/impaired                                       General ADL Comments: MAX A x3 for pericare rolling L+R.      Pertinent Vitals/Pain Pain Assessment Pain Assessment: 0-10 Pain Score: 10-Worst pain ever Pain Location: LLE with all movement Pain Descriptors / Indicators: Discomfort, Grimacing Pain Intervention(s): Limited activity within patient's tolerance, Patient requesting pain meds-RN notified     Hand Dominance     Extremity/Trunk Assessment Upper Extremity Assessment Upper Extremity Assessment: Generalized weakness   Lower Extremity Assessment Lower Extremity Assessment: Generalized weakness       Communication Communication Communication: No difficulties   Cognition Arousal/Alertness: Awake/alert Behavior During Therapy: WFL for tasks assessed/performed Overall Cognitive Status: Within Functional Limits for tasks assessed                                        Home Living Family/patient expects to be discharged to:: Private residence Living Arrangements: Alone Available Help at Discharge: Family;Available PRN/intermittently Type of Home:  Apartment Home Access: Level entry     Home Layout: One level               Home Equipment: Conservation officer, nature (2 wheels);Hospital bed;Shower seat          Prior Functioning/Environment Prior Level of Function : Needs assist             Mobility Comments: Pt reports she was mod I with rollator prior to admission to [facility]. Pt reports she left [facility] ambulatory  with RW but once she returned home she was physically unable to get back in bed after getting out so she stayed in bed all the time. Her mother & another family member would come by PRN to assist with peri hygiene & meals.          OT Problem List: Decreased strength;Decreased range of motion;Decreased activity tolerance;Impaired balance (sitting and/or standing);Decreased safety awareness;Pain      OT Treatment/Interventions: Self-care/ADL training;Therapeutic exercise;Energy conservation;DME and/or AE instruction;Therapeutic activities;Patient/family education;Balance training    OT Goals(Current goals can be found in the care plan section) Acute Rehab OT Goals Patient Stated Goal: to return to walking OT Goal Formulation: With patient Time For Goal Achievement: 09/30/21 Potential to Achieve Goals: Fair ADL Goals Pt Will Perform Grooming: with modified independence;bed level Pt Will Perform Upper Body Bathing: with set-up;with supervision;bed level Pt Will Transfer to Toilet: with mod assist (rolling at bed level)  OT Frequency: Min 1X/week    Co-evaluation PT/OT/SLP Co-Evaluation/Treatment: Yes Reason for Co-Treatment: Complexity of the patient's impairments (multi-system involvement) PT goals addressed during session: Mobility/safety with mobility        AM-PAC OT "6 Clicks" Daily Activity     Outcome Measure Help from another person eating meals?: A Little Help from another person taking care of personal grooming?: A Little Help from another person toileting, which includes using toliet, bedpan, or urinal?: Total Help from another person bathing (including washing, rinsing, drying)?: Total Help from another person to put on and taking off regular upper body clothing?: A Lot Help from another person to put on and taking off regular lower body clothing?: Total 6 Click Score: 11   End of Session Nurse Communication: Mobility status  Activity Tolerance: Patient tolerated  treatment well Patient left: in bed;with call bell/phone within reach;with nursing/sitter in room  OT Visit Diagnosis: Unsteadiness on feet (R26.81)                Time: 2035-5974 OT Time Calculation (min): 32 min Charges:  OT General Charges $OT Visit: 1 Visit OT Evaluation $OT Eval Moderate Complexity: 1 Mod OT Treatments $Self Care/Home Management : 23-37 mins  Dessie Coma, M.S. OTR/L  09/16/21, 4:19 PM  ascom [phone]

## 2021-09-16 NOTE — TOC Progression Note (Signed)
Transition of Care [facility]) - Progression Note    Patient Details  Name: Krystal Francis MRN: [id number] Date of Birth: November 17, 1960  Transition of Care [facility]) CM/SW [place], RN Phone Number: 09/16/2021, 3:25 PM  Clinical Narrative:    Received message from [facility] [place] reporting they are unable to accept patient back for services due to feeling that she is unsafe to be at home alone due to bedbound status. [place] social worker made suggestion for LTAC however no LTAC need at this time. TOC will work on possible SNF placement to allow for safe discharge and allow time for patient to work out DME provider and find better hospital bed for home.         Expected Discharge Plan and Services                                                 Social Determinants of Health (SDOH) Interventions    Readmission Risk Interventions     View : No data to display.

## 2021-09-16 NOTE — ED Notes (Signed)
Pt repositioned in bed for comfort. Pt denies other needs, pt is a four person assist.

## 2021-09-16 NOTE — ED Notes (Signed)
Bariatic bed arrives. Pt is moved over to new bed and adjusted to a level of comfort. Pt given water. Pt denies any other needs at this time. Call light within reach.

## 2021-09-16 NOTE — ED Notes (Signed)
MD goodman notified of need to order home medications

## 2021-09-17 ENCOUNTER — Emergency Department: Payer: Medicare Other

## 2021-09-17 ENCOUNTER — Encounter: Payer: Self-pay | Admitting: Family Medicine

## 2021-09-17 DIAGNOSIS — R319 Hematuria, unspecified: Secondary | ICD-10-CM | POA: Diagnosis present

## 2021-09-17 DIAGNOSIS — B9562 Methicillin resistant Staphylococcus aureus infection as the cause of diseases classified elsewhere: Secondary | ICD-10-CM | POA: Diagnosis not present

## 2021-09-17 DIAGNOSIS — J188 Other pneumonia, unspecified organism: Secondary | ICD-10-CM | POA: Diagnosis present

## 2021-09-17 DIAGNOSIS — R310 Gross hematuria: Secondary | ICD-10-CM | POA: Diagnosis present

## 2021-09-17 DIAGNOSIS — A4102 Sepsis due to Methicillin resistant Staphylococcus aureus: Secondary | ICD-10-CM | POA: Diagnosis present

## 2021-09-17 DIAGNOSIS — R7881 Bacteremia: Secondary | ICD-10-CM

## 2021-09-17 DIAGNOSIS — I2699 Other pulmonary embolism without acute cor pulmonale: Secondary | ICD-10-CM | POA: Diagnosis present

## 2021-09-17 DIAGNOSIS — Z6841 Body Mass Index (BMI) 40.0 and over, adult: Secondary | ICD-10-CM

## 2021-09-17 DIAGNOSIS — I76 Septic arterial embolism: Secondary | ICD-10-CM | POA: Diagnosis present

## 2021-09-17 DIAGNOSIS — R6520 Severe sepsis without septic shock: Secondary | ICD-10-CM | POA: Diagnosis present

## 2021-09-17 DIAGNOSIS — I10 Essential (primary) hypertension: Secondary | ICD-10-CM | POA: Diagnosis not present

## 2021-09-17 DIAGNOSIS — R339 Retention of urine, unspecified: Secondary | ICD-10-CM | POA: Diagnosis present

## 2021-09-17 DIAGNOSIS — Z86718 Personal history of other venous thrombosis and embolism: Secondary | ICD-10-CM | POA: Diagnosis not present

## 2021-09-17 DIAGNOSIS — Z7902 Long term (current) use of antithrombotics/antiplatelets: Secondary | ICD-10-CM | POA: Diagnosis not present

## 2021-09-17 DIAGNOSIS — J189 Pneumonia, unspecified organism: Secondary | ICD-10-CM | POA: Diagnosis present

## 2021-09-17 DIAGNOSIS — I82721 Chronic embolism and thrombosis of deep veins of right upper extremity: Secondary | ICD-10-CM | POA: Diagnosis not present

## 2021-09-17 DIAGNOSIS — I50812 Chronic right heart failure: Secondary | ICD-10-CM

## 2021-09-17 DIAGNOSIS — I82A11 Acute embolism and thrombosis of right axillary vein: Secondary | ICD-10-CM | POA: Diagnosis not present

## 2021-09-17 DIAGNOSIS — I2782 Chronic pulmonary embolism: Secondary | ICD-10-CM | POA: Diagnosis not present

## 2021-09-17 DIAGNOSIS — D250 Submucous leiomyoma of uterus: Secondary | ICD-10-CM | POA: Diagnosis present

## 2021-09-17 DIAGNOSIS — Z7901 Long term (current) use of anticoagulants: Secondary | ICD-10-CM | POA: Diagnosis not present

## 2021-09-17 DIAGNOSIS — N368 Other specified disorders of urethra: Secondary | ICD-10-CM | POA: Diagnosis present

## 2021-09-17 DIAGNOSIS — D509 Iron deficiency anemia, unspecified: Secondary | ICD-10-CM | POA: Diagnosis present

## 2021-09-17 DIAGNOSIS — A419 Sepsis, unspecified organism: Secondary | ICD-10-CM

## 2021-09-17 DIAGNOSIS — I110 Hypertensive heart disease with heart failure: Secondary | ICD-10-CM | POA: Diagnosis present

## 2021-09-17 DIAGNOSIS — I50810 Right heart failure, unspecified: Secondary | ICD-10-CM | POA: Diagnosis present

## 2021-09-17 DIAGNOSIS — I739 Peripheral vascular disease, unspecified: Secondary | ICD-10-CM | POA: Diagnosis present

## 2021-09-17 DIAGNOSIS — J9601 Acute respiratory failure with hypoxia: Secondary | ICD-10-CM

## 2021-09-17 DIAGNOSIS — Z7189 Other specified counseling: Secondary | ICD-10-CM | POA: Diagnosis not present

## 2021-09-17 DIAGNOSIS — D62 Acute posthemorrhagic anemia: Secondary | ICD-10-CM | POA: Diagnosis present

## 2021-09-17 DIAGNOSIS — I2721 Secondary pulmonary arterial hypertension: Secondary | ICD-10-CM | POA: Diagnosis present

## 2021-09-17 DIAGNOSIS — D649 Anemia, unspecified: Secondary | ICD-10-CM

## 2021-09-17 DIAGNOSIS — I38 Endocarditis, valve unspecified: Secondary | ICD-10-CM | POA: Diagnosis not present

## 2021-09-17 DIAGNOSIS — E871 Hypo-osmolality and hyponatremia: Secondary | ICD-10-CM | POA: Diagnosis present

## 2021-09-17 DIAGNOSIS — Z8249 Family history of ischemic heart disease and other diseases of the circulatory system: Secondary | ICD-10-CM | POA: Diagnosis not present

## 2021-09-17 DIAGNOSIS — Z20822 Contact with and (suspected) exposure to covid-19: Secondary | ICD-10-CM | POA: Diagnosis present

## 2021-09-17 DIAGNOSIS — Z86711 Personal history of pulmonary embolism: Secondary | ICD-10-CM | POA: Diagnosis not present

## 2021-09-17 DIAGNOSIS — J15212 Pneumonia due to Methicillin resistant Staphylococcus aureus: Secondary | ICD-10-CM | POA: Diagnosis present

## 2021-09-17 DIAGNOSIS — I82621 Acute embolism and thrombosis of deep veins of right upper extremity: Secondary | ICD-10-CM | POA: Diagnosis present

## 2021-09-17 DIAGNOSIS — J449 Chronic obstructive pulmonary disease, unspecified: Secondary | ICD-10-CM | POA: Diagnosis present

## 2021-09-17 DIAGNOSIS — Z515 Encounter for palliative care: Secondary | ICD-10-CM | POA: Diagnosis not present

## 2021-09-17 LAB — RETICULOCYTES
Immature Retic Fract: 39.4 % — ABNORMAL HIGH (ref 2.3–15.9)
RBC.: 3.31 MIL/uL — ABNORMAL LOW (ref 3.87–5.11)
Retic Count, Absolute: 52.6 10*3/uL (ref 19.0–186.0)
Retic Ct Pct: 1.6 % (ref 0.4–3.1)

## 2021-09-17 LAB — EXPECTORATED SPUTUM ASSESSMENT W GRAM STAIN, RFLX TO RESP C

## 2021-09-17 LAB — CBC WITH DIFFERENTIAL/PLATELET
Abs Immature Granulocytes: 0.2 10*3/uL — ABNORMAL HIGH (ref 0.00–0.07)
Basophils Absolute: 0.1 10*3/uL (ref 0.0–0.1)
Basophils Relative: 0 %
Eosinophils Absolute: 0.1 10*3/uL (ref 0.0–0.5)
Eosinophils Relative: 1 %
HCT: 26.5 % — ABNORMAL LOW (ref 36.0–46.0)
Hemoglobin: 7.7 g/dL — ABNORMAL LOW (ref 12.0–15.0)
Immature Granulocytes: 1 %
Lymphocytes Relative: 10 %
Lymphs Abs: 1.6 10*3/uL (ref 0.7–4.0)
MCH: 21.8 pg — ABNORMAL LOW (ref 26.0–34.0)
MCHC: 29.1 g/dL — ABNORMAL LOW (ref 30.0–36.0)
MCV: 75.1 fL — ABNORMAL LOW (ref 80.0–100.0)
Monocytes Absolute: 1.2 10*3/uL — ABNORMAL HIGH (ref 0.1–1.0)
Monocytes Relative: 7 %
Neutro Abs: 13.6 10*3/uL — ABNORMAL HIGH (ref 1.7–7.7)
Neutrophils Relative %: 81 %
Platelets: 274 10*3/uL (ref 150–400)
RBC: 3.53 MIL/uL — ABNORMAL LOW (ref 3.87–5.11)
RDW: 21.7 % — ABNORMAL HIGH (ref 11.5–15.5)
Smear Review: UNDETERMINED
WBC: 16.7 10*3/uL — ABNORMAL HIGH (ref 4.0–10.5)
nRBC: 0 % (ref 0.0–0.2)

## 2021-09-17 LAB — LACTIC ACID, PLASMA
Lactic Acid, Venous: 0.9 mmol/L (ref 0.5–1.9)
Lactic Acid, Venous: 1.4 mmol/L (ref 0.5–1.9)

## 2021-09-17 LAB — APTT: aPTT: 38 seconds — ABNORMAL HIGH (ref 24–36)

## 2021-09-17 LAB — PROTIME-INR
INR: 1.5 — ABNORMAL HIGH (ref 0.8–1.2)
Prothrombin Time: 18 seconds — ABNORMAL HIGH (ref 11.4–15.2)

## 2021-09-17 LAB — CBC
HCT: 25 % — ABNORMAL LOW (ref 36.0–46.0)
Hemoglobin: 7.3 g/dL — ABNORMAL LOW (ref 12.0–15.0)
MCH: 21.4 pg — ABNORMAL LOW (ref 26.0–34.0)
MCHC: 29.2 g/dL — ABNORMAL LOW (ref 30.0–36.0)
MCV: 73.3 fL — ABNORMAL LOW (ref 80.0–100.0)
Platelets: 268 10*3/uL (ref 150–400)
RBC: 3.41 MIL/uL — ABNORMAL LOW (ref 3.87–5.11)
RDW: 22.1 % — ABNORMAL HIGH (ref 11.5–15.5)
WBC: 16.4 10*3/uL — ABNORMAL HIGH (ref 4.0–10.5)
nRBC: 0 % (ref 0.0–0.2)

## 2021-09-17 LAB — PROCALCITONIN: Procalcitonin: 0.34 ng/mL

## 2021-09-17 LAB — FERRITIN: Ferritin: 219 ng/mL (ref 11–307)

## 2021-09-17 LAB — RESP PANEL BY RT-PCR (FLU A&B, COVID) ARPGX2
Influenza A by PCR: NEGATIVE
Influenza B by PCR: NEGATIVE
SARS Coronavirus 2 by RT PCR: NEGATIVE

## 2021-09-17 LAB — HEPARIN LEVEL (UNFRACTIONATED): Heparin Unfractionated: 0.13 IU/mL — ABNORMAL LOW (ref 0.30–0.70)

## 2021-09-17 LAB — IRON AND TIBC
Iron: 15 ug/dL — ABNORMAL LOW (ref 28–170)
Saturation Ratios: 14 % (ref 10.4–31.8)
TIBC: 108 ug/dL — ABNORMAL LOW (ref 250–450)
UIBC: 93 ug/dL

## 2021-09-17 LAB — TROPONIN I (HIGH SENSITIVITY)
Troponin I (High Sensitivity): 8 ng/L (ref <18)
Troponin I (High Sensitivity): 8 ng/L (ref <18)
Troponin I (High Sensitivity): 8 ng/L (ref <18)

## 2021-09-17 LAB — FOLATE: Folate: 11 ng/mL (ref >5.9)

## 2021-09-17 LAB — VITAMIN B12: Vitamin B-12: 682 pg/mL (ref 180–914)

## 2021-09-17 LAB — BRAIN NATRIURETIC PEPTIDE: B Natriuretic Peptide: 67.1 pg/mL (ref 0.0–100.0)

## 2021-09-17 MED ORDER — SODIUM CHLORIDE 0.9 % IV SOLN
500.0000 mg | INTRAVENOUS | Status: DC
  Administered 2021-09-17 – 2021-09-18 (×2): 500 mg via INTRAVENOUS
  Filled 2021-09-17: qty 500
  Filled 2021-09-17: qty 5

## 2021-09-17 MED ORDER — ACETAMINOPHEN 325 MG PO TABS: 650.0000 mg | ORAL_TABLET | Freq: Four times a day (QID) | ORAL | Status: DC | PRN

## 2021-09-17 MED ORDER — HYDROCOD POLI-CHLORPHE POLI ER 10-8 MG/5ML PO SUER
5.0000 mL | Freq: Two times a day (BID) | ORAL | Status: DC | PRN
  Administered 2021-09-25: 5 mL via ORAL
  Filled 2021-09-17: qty 5

## 2021-09-17 MED ORDER — MORPHINE SULFATE (PF) 4 MG/ML IV SOLN
INTRAVENOUS | Status: AC
  Administered 2021-09-17: 4 mg via INTRAVENOUS
  Filled 2021-09-17: qty 1

## 2021-09-17 MED ORDER — TRAZODONE HCL 50 MG PO TABS
25.0000 mg | ORAL_TABLET | Freq: Every evening | ORAL | Status: DC | PRN
  Administered 2021-09-20 – 2021-10-12 (×21): 25 mg via ORAL
  Filled 2021-09-17 (×22): qty 1

## 2021-09-17 MED ORDER — IPRATROPIUM-ALBUTEROL 0.5-2.5 (3) MG/3ML IN SOLN
3.0000 mL | Freq: Three times a day (TID) | RESPIRATORY_TRACT | Status: DC
  Administered 2021-09-17 – 2021-09-18 (×4): 3 mL via RESPIRATORY_TRACT
  Filled 2021-09-17 (×4): qty 3

## 2021-09-17 MED ORDER — ALBUTEROL SULFATE (2.5 MG/3ML) 0.083% IN NEBU
3.0000 mL | INHALATION_SOLUTION | RESPIRATORY_TRACT | Status: DC | PRN
  Filled 2021-09-17: qty 3

## 2021-09-17 MED ORDER — HEPARIN BOLUS VIA INFUSION
3200.0000 [IU] | Freq: Once | INTRAVENOUS | Status: AC
  Administered 2021-09-17: 3200 [IU] via INTRAVENOUS
  Filled 2021-09-17: qty 3200

## 2021-09-17 MED ORDER — VANCOMYCIN HCL 2000 MG/400ML IV SOLN
2000.0000 mg | Freq: Once | INTRAVENOUS | Status: AC
  Administered 2021-09-17: 2000 mg via INTRAVENOUS
  Filled 2021-09-17: qty 400

## 2021-09-17 MED ORDER — ENOXAPARIN SODIUM 40 MG/0.4ML IJ SOSY: 40.0000 mg | PREFILLED_SYRINGE | INTRAMUSCULAR | Status: DC

## 2021-09-17 MED ORDER — SODIUM CHLORIDE 0.9 % IV SOLN: INTRAVENOUS | Status: DC

## 2021-09-17 MED ORDER — MAGNESIUM HYDROXIDE 400 MG/5ML PO SUSP: 30.0000 mL | Freq: Every day | ORAL | Status: DC | PRN

## 2021-09-17 MED ORDER — HEPARIN (PORCINE) 25000 UT/250ML-% IV SOLN
3100.0000 [IU]/h | INTRAVENOUS | Status: DC
  Administered 2021-09-17: 2100 [IU]/h via INTRAVENOUS
  Administered 2021-09-17: 1700 [IU]/h via INTRAVENOUS
  Administered 2021-09-18: 2400 [IU]/h via INTRAVENOUS
  Administered 2021-09-18 – 2021-09-19 (×2): 2600 [IU]/h via INTRAVENOUS
  Administered 2021-09-19 – 2021-09-21 (×4): 2900 [IU]/h via INTRAVENOUS
  Filled 2021-09-17 (×9): qty 250

## 2021-09-17 MED ORDER — LACTATED RINGERS IV BOLUS: 1000.0000 mL | Freq: Once | INTRAVENOUS | Status: DC

## 2021-09-17 MED ORDER — LACTATED RINGERS IV BOLUS
1000.0000 mL | Freq: Once | INTRAVENOUS | Status: AC
  Administered 2021-09-17: 1000 mL via INTRAVENOUS

## 2021-09-17 MED ORDER — CHLORHEXIDINE GLUCONATE CLOTH 2 % EX PADS
6.0000 | MEDICATED_PAD | Freq: Every day | CUTANEOUS | Status: DC
  Administered 2021-09-17 – 2021-09-19 (×3): 6 via TOPICAL

## 2021-09-17 MED ORDER — ONDANSETRON HCL 4 MG PO TABS: 4.0000 mg | ORAL_TABLET | Freq: Four times a day (QID) | ORAL | Status: DC | PRN

## 2021-09-17 MED ORDER — IOHEXOL 350 MG/ML SOLN
125.0000 mL | Freq: Once | INTRAVENOUS | Status: AC | PRN
  Administered 2021-09-17: 125 mL via INTRAVENOUS

## 2021-09-17 MED ORDER — ONDANSETRON HCL 4 MG/2ML IJ SOLN: 4.0000 mg | Freq: Four times a day (QID) | INTRAMUSCULAR | Status: DC | PRN

## 2021-09-17 MED ORDER — ACETAMINOPHEN 650 MG RE SUPP: 650.0000 mg | Freq: Four times a day (QID) | RECTAL | Status: DC | PRN

## 2021-09-17 MED ORDER — HYDRALAZINE HCL 20 MG/ML IJ SOLN: 5.0000 mg | INTRAMUSCULAR | Status: DC | PRN

## 2021-09-17 MED ORDER — VANCOMYCIN HCL 1500 MG/300ML IV SOLN
1500.0000 mg | Freq: Two times a day (BID) | INTRAVENOUS | Status: DC
  Administered 2021-09-17 – 2021-09-19 (×5): 1500 mg via INTRAVENOUS
  Filled 2021-09-17 (×6): qty 300

## 2021-09-17 MED ORDER — FUROSEMIDE 10 MG/ML IJ SOLN
40.0000 mg | Freq: Once | INTRAMUSCULAR | Status: AC
Start: 2021-09-17 — End: 2021-09-17
  Administered 2021-09-17: 40 mg via INTRAVENOUS
  Filled 2021-09-17: qty 4

## 2021-09-17 MED ORDER — GUAIFENESIN ER 600 MG PO TB12
600.0000 mg | ORAL_TABLET | Freq: Two times a day (BID) | ORAL | Status: DC
  Administered 2021-09-17 – 2021-09-25 (×17): 600 mg via ORAL
  Filled 2021-09-17 (×18): qty 1

## 2021-09-17 MED ORDER — IPRATROPIUM-ALBUTEROL 0.5-2.5 (3) MG/3ML IN SOLN
3.0000 mL | Freq: Four times a day (QID) | RESPIRATORY_TRACT | Status: DC
  Administered 2021-09-17: 3 mL via RESPIRATORY_TRACT
  Filled 2021-09-17: qty 3

## 2021-09-17 MED ORDER — MORPHINE SULFATE (PF) 4 MG/ML IV SOLN: 4.0000 mg | Freq: Once | INTRAVENOUS | Status: AC

## 2021-09-17 MED ORDER — ENOXAPARIN SODIUM 120 MG/0.8ML IJ SOSY: 105.0000 mg | PREFILLED_SYRINGE | INTRAMUSCULAR | Status: DC

## 2021-09-17 MED ORDER — SODIUM CHLORIDE 0.9 % IV SOLN
1.0000 g | Freq: Once | INTRAVENOUS | Status: AC
  Administered 2021-09-17: 1 g via INTRAVENOUS
  Filled 2021-09-17: qty 10

## 2021-09-17 MED ORDER — SODIUM CHLORIDE 0.9 % IV SOLN
2.0000 g | INTRAVENOUS | Status: DC
  Administered 2021-09-17: 2 g via INTRAVENOUS
  Filled 2021-09-17: qty 20

## 2021-09-17 NOTE — Assessment & Plan Note (Addendum)
multifocal pneumonia and sepsis due to pneumonia: CTA is limited study, did not show central PE, showed multifocal infiltration.  Patient had leukocytosis with WBC 16.7, indicating possible multifocal pneumonia.  Patient met criteria for sepsis with WBC 16.7, heart rate 114, 26.  Lactic acid normal 0.9, 1.4.  IV Rocephin and azithromycin (pt received one dose of IV Vancomycin and cefepime in ED) --> vancomycin per ID  Given MRSA bacteremia will need IV abx x6 weeks total  Discharge antibiotics: Vancomycin 2 grams IV every 24 hours per pharmacy protocol, aim for Vancomycin trough 15-17 (unless otherwise indicated). Duration: 6 weeks, End Date: 11/03/21 [facility] Care Per Protocol. Labs weekly on Monday while on IV antibiotics: CBC with differential, CMP, Vancomycin trough. Labs weekly on Thursday While on antibiotics: Vancomycin Trough, BMP. Please pull PIC at completion of IV antibiotics. Fax weekly labs to [phone]. Clinic Follow Up Appt: 10/25/21 at 9.30 [facility] video visit

## 2021-09-17 NOTE — ED Notes (Signed)
Lab called for assist of blood cultures.

## 2021-09-17 NOTE — ED Notes (Signed)
Pt states she feels acutely shob. Pt with increased resp rate. Md notified, orders obtained.

## 2021-09-17 NOTE — NC FL2 (Signed)
[place] LEVEL OF CARE SCREENING TOOL     IDENTIFICATION  Patient Name: Krystal Francis Birthdate: 05/13/1960 Sex: female Admission Date (Current Location): 09/15/2021  [facility] and [place] Number:  Engineering geologist and Address:  [facility], [address] [place], [place] [address]      Provider Number:    Attending Physician Name and Address:  Ivor Costa, MD  Relative Name and Phone Number:       Current Level of Care: SNF Recommended Level of Care:   Prior Approval Number:    Date Approved/Denied:   PASRR Number: [id number]  Discharge Plan: SNF    Current Diagnoses: Patient Active Problem List   Diagnosis Date Noted   Multifocal pneumonia 09/17/2021   Sepsis ([place]) 09/17/2021   Hematuria 09/17/2021   Iron deficiency anemia 09/17/2021   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[place]) 09/17/2021   Pulmonary embolism ([place]) 09/17/2021   Urinary retention 09/17/2021   Vaginal bleeding 07/20/2021   Symptomatic anemia 07/20/2021   Morbid obesity with body mass index of 70 and over in adult [facility]) 07/20/2021   AKI (acute kidney injury) ([place]) 07/20/2021   HTN (hypertension) 07/20/2021   Hyperkalemia 07/20/2021   Fibroid, uterine 07/20/2021    Orientation RESPIRATION BLADDER Height & Weight     Self, Time, Situation, Place  Normal Continent, Indwelling catheter Weight: (!) 456 lb (206.8 kg) Height:  '5\' 4"'$  (162.6 cm)  BEHAVIORAL SYMPTOMS/MOOD NEUROLOGICAL BOWEL NUTRITION STATUS      Continent Diet  AMBULATORY STATUS COMMUNICATION OF NEEDS Skin   Extensive Assist Verbally Skin abrasions (2 small skin tears to buttocks)                       Personal Care Assistance Level of Assistance  Feeding, Bathing, Dressing Bathing Assistance: Limited assistance Feeding assistance: Independent Dressing Assistance: Maximum assistance     Functional Limitations Info  Sight, Hearing, Speech Sight Info: Adequate Hearing Info: Adequate Speech Info:  Adequate    SPECIAL CARE FACTORS FREQUENCY  PT (By licensed PT), OT (By licensed OT)     PT Frequency: 5x per week OT Frequency: 5x per week            Contractures Contractures Info: Not present    Additional Factors Info  Code Status Code Status Info: full code             Current Medications (09/17/2021):  This is the current hospital active medication list Current Facility-Administered Medications  Medication Dose Route Frequency Provider Last Rate Last Admin   acetaminophen (TYLENOL) tablet 650 mg  650 mg Oral Q6H PRN Mansy, Arvella Merles, MD       Or   acetaminophen (TYLENOL) suppository 650 mg  650 mg Rectal Q6H PRN Mansy, Jan A, MD       albuterol (PROVENTIL) (2.5 MG/3ML) 0.083% nebulizer solution 3 mL  3 mL Inhalation Q4H PRN Ivor Costa, MD       azithromycin (ZITHROMAX) 500 mg in sodium chloride 0.9 % 250 mL IVPB  500 mg Intravenous Q24H Mansy, Jan A, MD 250 mL/hr at 09/17/21 0956 500 mg at 09/17/21 0956   cefTRIAXone (ROCEPHIN) 2 g in sodium chloride 0.9 % 100 mL IVPB  2 g Intravenous Q24H Mansy, Jan A, MD 200 mL/hr at 09/17/21 1144 2 g at 09/17/21 1144   Chlorhexidine Gluconate Cloth 2 % PADS 6 each  6 each Topical Daily Mansy, Arvella Merles, MD   6 each at 09/17/21  0957   chlorpheniramine-HYDROcodone 10-8 MG/5ML suspension 5 mL  5 mL Oral Q12H PRN Mansy, Jan A, MD       ferrous sulfate tablet 325 mg  325 mg Oral Q breakfast Nance Pear, MD   325 mg at 09/17/21 0839   guaiFENesin (MUCINEX) 12 hr tablet 600 mg  600 mg Oral BID Mansy, Jan A, MD   600 mg at 09/17/21 3009   heparin ADULT infusion 100 units/mL (25000 units/236m)  1,700 Units/hr Intravenous Continuous POswald Hillock RPH 17 mL/hr at 09/17/21 1020 1,700 Units/hr at 09/17/21 1020   hydrALAZINE (APRESOLINE) injection 5 mg  5 mg Intravenous Q2H PRN NIvor Costa MD       HYDROcodone-acetaminophen (NORCO/VICODIN) 5-325 MG per tablet 1 tablet  1 tablet Oral Q4H PRN SCarrie Mew MD   1 tablet at 09/16/21 1230    ipratropium-albuterol (DUONEB) 0.5-2.5 (3) MG/3ML nebulizer solution 3 mL  3 mL Nebulization TID NIvor Costa MD       magnesium hydroxide (MILK OF MAGNESIA) suspension 30 mL  30 mL Oral Daily PRN Mansy, Jan A, MD       magnesium oxide (MAG-OX) tablet 400 mg  400 mg Oral Daily GNance Pear MD   400 mg at 09/17/21 02330  medroxyPROGESTERone (PROVERA) tablet 10 mg  10 mg Oral BID GNance Pear MD   10 mg at 09/17/21 0952   ondansetron (ZOFRAN) tablet 4 mg  4 mg Oral Q6H PRN Mansy, Jan A, MD       Or   ondansetron ([facility] injection 4 mg  4 mg Intravenous Q6H PRN Mansy, Jan A, MD       pantoprazole (PROTONIX) EC tablet 40 mg  40 mg Oral Daily GNance Pear MD   40 mg at 09/17/21 00762  torsemide (DEMADEX) tablet 20 mg  20 mg Oral Daily GNance Pear MD   20 mg at 09/17/21 02633  traZODone (DESYREL) tablet 25 mg  25 mg Oral QHS PRN Mansy, JArvella Merles MD         Discharge Medications: Please see discharge summary for a list of discharge medications.  Relevant Imaging Results:  Relevant Lab Results:   Additional Information Social Security# [id number] [place]

## 2021-09-17 NOTE — Plan of Care (Signed)
  Problem: Education: Goal: Knowledge of General Education information will improve Description Including pain rating scale, medication(s)/side effects and non-pharmacologic comfort measures Outcome: Progressing   Problem: Clinical Measurements: Goal: Ability to maintain clinical measurements within normal limits will improve Outcome: Progressing Goal: Will remain free from infection Outcome: Progressing Goal: Diagnostic test results will improve Outcome: Progressing Goal: Respiratory complications will improve Outcome: Progressing Goal: Cardiovascular complication will be avoided Outcome: Progressing   Problem: Pain Managment: Goal: General experience of comfort will improve Outcome: Progressing   

## 2021-09-17 NOTE — Assessment & Plan Note (Addendum)
   IV hydralazine as needed  Patient is on torsemide

## 2021-09-17 NOTE — ED Notes (Signed)
Pt returned from ct scan. Pt repositioned in bed, no extra lumps from linens or pads under pt, no cords or plastic under pt. Ice water provided. Pt states pain has improved to left leg from morphine. Call bell at right side,

## 2021-09-17 NOTE — ED Notes (Signed)
Pt to ct scan with six person assist.

## 2021-09-17 NOTE — Assessment & Plan Note (Addendum)
Patient has morbid obesity, very difficult to assess volume status.   2D echo on 08/16/21: showed normal left ventricular systolic function with mild LVH, moderate RV systolic dysfunction.  Will continue torsemide 20 mg daily  06/06 TEE no cocnerns, cardiology has signed off

## 2021-09-17 NOTE — Consult Note (Signed)
61 yo female with rv failure, PE on lovenox, indwelling foley, admitted with multifocal pna/dyspnea now with mrsa in the blood   Chart reviewed Chest cta reviewed "multifocal pna"  Sepsis on presentation  5/20 bcx mrsa 5/20 respiratory cx in process gpc in pairs and gram variable rod  Reviewed h&P physical exam finding Morbidly obese No mrg on cardiac exam No rash No joint tenderness Alert nonfocal neural exam Conj clear   A/p Mrsa bacteremia "Multifocal pulm infiltrate"    Unclear source if bacteremia with septic pulm emboli or primary pna with 2nd bsi. History with several days respiratory sx. Could be viral uri superimposed with mrsa pna but sputum cx so far not suggestive    -repeat bcx tomorrow -remove indwelling catheter if present prior to admission or placed during bacteremic episode -tte -image tender back spine and joint if indicated -assess for other indwelling device and remove as needed -continue vanc -dr Delaine Lame to see [place] Antimicrobial Management Team Staphylococcus aureus bacteremia   Staphylococcus aureus bacteremia (SAB) is associated with a high rate of complications and mortality.  Specific aspects of clinical management are critical to optimizing the outcome of patients with SAB.  Therefore, the [facility] Antimicrobial Management Team [facility]) has initiated an intervention aimed at improving the management of SAB at [facility].  To do so, Infectious Diseases physicians are providing an evidence-based consult for the management of all patients with SAB.     Yes No Comments  Perform follow-up blood cultures (even if the patient is afebrile) to ensure clearance of bacteremia '[x]'$  '[]'$    Remove vascular catheter and obtain follow-up blood cultures after the removal of the catheter '[x]'$  '[]'$    Perform echocardiography to evaluate for endocarditis (transthoracic ECHO is 40-50% sensitive, TEE is > 90% sensitive) '[x]'$  '[]'$  Please  keep in mind, that neither test can definitively EXCLUDE endocarditis, and that should clinical suspicion remain high for endocarditis the patient should then still be treated with an "endocarditis" duration of therapy = 6 weeks  Consult electrophysiologist to evaluate implanted cardiac device (pacemaker, ICD) '[]'$  '[]'$    Ensure source control '[]'$  '[]'$  Have all abscesses been drained effectively? Have deep seeded infections (septic joints or osteomyelitis) had appropriate surgical debridement?  Investigate for "metastatic" sites of infection '[x]'$  '[]'$  Does the patient have ANY symptom or physical exam finding that would suggest a deeper infection (back or neck pain that may be suggestive of vertebral osteomyelitis or epidural abscess, muscle pain that could be a symptom of pyomyositis)?  Keep in mind that for deep seeded infections MRI imaging with contrast is preferred rather than other often insensitive tests such as plain x-rays, especially early in a patient's presentation.  Change antibiotic therapy to ________vancomycin '[x]'$  '[]'$  Beta-lactam antibiotics are preferred for MSSA due to higher cure rates.   If on Vancomycin, goal trough should be 15 - 20 mcg/mL  Estimated duration of IV antibiotic therapy:   '[]'$  '[]'$  Consult case management for probably prolonged outpatient IV antibiotic therapy

## 2021-09-17 NOTE — H&P (Addendum)
History and Physical    Krystal Francis NAT:[id number] DOB: 1960-08-21 DOA: 09/15/2021  Referring MD/NP/PA:   PCP: Latanya Maudlin, NP   Patient coming from:  The patient is coming from home.  At baseline, pt is independent for most of ADL.        Chief Complaint: SOB  HPI: Krystal Francis is a 61 y.o. female with medical history significant of RV failure, hypertension, GERD, PE on Lovenox, iron deficiency anemia, urinary retention with Foley catheter placement, uterine bleeding, morbid obesity with BMI 78.27, who presents with shortness of breath.  Pt has been boarding in ED since 1:20 PM of 5/18. Patient states that she has shortness breath in the past several days, cough with little mucus production.  Denies chest pain, fever or chills.  No nausea, vomiting, diarrhea or abdominal pain.  Patient has chronic Foley catheter due to urinary retention.  Patient has hematuria. EDP examined patient's pelvic area including the external vaginal vault, urethra, and perineum.  The patient has no vaginal bleeding. The blood seem to have been coming from the urethra itself.  As soon as the Foley catheter was replaced, clear urine flowed out.  Patient does not have symptoms of UTI.   Data Reviewed and ED Course: pt was found to have WBC 16.7, BNP 67.1, troponin negative x3, lactic acid 0.9, 1.4, negative urinalysis for UTI, renal function okay, temperature 99, blood pressure 96/53, heart rate 114, RR 26, oxygen saturation 87% on room air, which improved to 93-95% on 3 L oxygen (patient is using oxygen intermittently at home).  CTA is a limited test, but negative for central large PE, but showed possible multifocal pneumonia.  Patient is admitted to telemetry bed as inpatient.  CTA: Technically limited examination without evidence of large central pulmonary embolus. Lobar and peripheral pulmonary arterial vasculature is not adequately opacified on this examination to definitively exclude the presence of acute  pulmonary embolus.   Morphologic changes in keeping with pulmonary arterial hypertension.   Multifocal pulmonary consolidation in keeping with multifocal pneumonic consolidation in the appropriate clinical setting. No central obstructing lesion. Probable reactive right hilar adenopathy.   Enhancing right axillary adenopathy demonstrating perinodal infiltration in keeping with acute infection, inflammation or neoplastic infiltration. Asymmetric right axillary edema. Correlation with clinical examination including right breast examination is recommended.  EKG: I have personally reviewed.  Sinus rhythm, QTc 425, early R wave progression, Q wave in lead III, T wave inversion in V1-V3   Review of Systems:   General: no fevers, chills, no body weight gain, has fatigue HEENT: no blurry vision, hearing changes or sore throat Respiratory: has dyspnea, coughing, no wheezing CV: no chest pain, no palpitations GI: no nausea, vomiting, abdominal pain, diarrhea, constipation GU: no dysuria, burning on urination, increased urinary frequency, hematuria  Ext: has trace leg edema Neuro: no unilateral weakness, numbness, or tingling, no vision change or hearing loss Skin: no rash, no skin tear. MSK: No muscle spasm, no deformity, no limitation of range of movement in spin Heme: No easy bruising.  Travel history: No recent [place] distant travel.   Allergy: No Known Allergies  Past Medical History:  Diagnosis Date   Anemia, unspecified    Hypertension     Past Surgical History:  Procedure Laterality Date   DILATION AND CURETTAGE OF UTERUS      Social History:  reports that she has never smoked. She has never used smokeless tobacco. She reports that she does not drink alcohol and does not use  drugs.  Family History:  Family History  Problem Relation Age of Onset   Hypertension Mother    Hypertension Father    Diabetes Mellitus I Father      Prior to Admission medications    Medication Sig Start Date End Date Taking? Authorizing Provider  enoxaparin (LOVENOX) 120 MG/0.8ML injection Inject 120 mg into the skin daily. 09/01/21  Yes [provider]  ferrous sulfate 325 (65 FE) MG EC tablet Take 1 tablet (325 mg total) by mouth 3 (three) times daily with meals. 07/21/21 07/21/22 Yes Lorella Nimrod, MD  furosemide (LASIX) 40 MG tablet Take 40 mg by mouth daily. 07/31/21  Yes [provider]  magnesium oxide (MAG-OX) 400 (240 Mg) MG tablet Take 1 tablet by mouth daily. 06/17/21  Yes [provider]  medroxyPROGESTERone (PROVERA) 10 MG tablet Take 10 mg by mouth in the morning and at bedtime. 07/12/21  Yes [provider]  pantoprazole (PROTONIX) 40 MG tablet Take 40 mg by mouth daily. 09/01/21  Yes [provider]  torsemide (DEMADEX) 20 MG tablet Take 20 mg by mouth daily. 09/01/21  Yes [provider]  vitamin B-12 (CYANOCOBALAMIN) 1000 MCG tablet Take by mouth. 07/12/21  Yes [provider]    Physical Exam: Vitals:   09/17/21 0733 09/17/21 0814 09/17/21 1236 09/17/21 1647  BP:  (!) 101/50 (!) 102/45 (!) 96/57  Pulse:  99 98 (!) 107  Resp:  '20 20 20  '$ Temp:  98.1 F (36.7 C) 98.1 F (36.7 C) 99.2 F (37.3 C)  TempSrc:  Oral  Oral  SpO2: 95% 95% 94% 93%  Weight:      Height:       General: Not in acute distress HEENT:       Eyes: PERRL, EOMI, no scleral icterus.       ENT: No discharge from the ears and nose, no pharynx injection, no tonsillar enlargement.        Neck: Difficult to assess JVD due to morbid obesity, no bruit, no mass felt. Heme: No neck lymph node enlargement. Cardiac: S1/S2, RRR, No murmurs, No gallops or rubs. Respiratory: No rales, wheezing, rhonchi or rubs. GI: Soft, nondistended, nontender, no rebound pain, no organomegaly, BS present. GU: No hematuria Ext: has trace leg edema bilaterally. 1+DP/PT pulse bilaterally. Musculoskeletal: No joint deformities, No joint redness or warmth, no  limitation of ROM in spin. Skin: No rashes.  Neuro: Alert, oriented X3, cranial nerves II-XII grossly intact, moves all extremities normally. Psych: Patient is not psychotic, no suicidal or hemocidal ideation.  Labs on Admission: I have personally reviewed following labs and imaging studies  CBC: Recent Labs  Lab 09/15/21 1328 09/17/21 0050 09/17/21 1605  WBC 15.0* 16.7* 16.4*  NEUTROABS  --  13.6*  --   HGB 8.6* 7.7* 7.3*  HCT 30.1* 26.5* 25.0*  MCV 76.0* 75.1* 73.3*  PLT 212 274 710   Basic Metabolic Panel: Recent Labs  Lab 09/15/21 1328  NA 133*  K 4.5  CL 99  CO2 21*  GLUCOSE 121*  BUN 16  CREATININE 0.62  CALCIUM 8.8*   GFR: Estimated Creatinine Clearance: 138.1 mL/min (by C-G formula based on SCr of 0.62 mg/dL). Liver Function Tests: No results for input(s): AST, ALT, ALKPHOS, BILITOT, PROT, ALBUMIN in the last 168 hours. No results for input(s): LIPASE, AMYLASE in the last 168 hours. No results for input(s): AMMONIA in the last 168 hours. Coagulation Profile: Recent Labs  Lab 09/17/21 1022  INR 1.5*  Cardiac Enzymes: No results for input(s): CKTOTAL, CKMB, CKMBINDEX, TROPONINI in the last 168 hours. BNP (last 3 results) No results for input(s): PROBNP in the last 8760 hours. HbA1C: No results for input(s): HGBA1C in the last 72 hours. CBG: No results for input(s): GLUCAP in the last 168 hours. Lipid Profile: No results for input(s): CHOL, HDL, LDLCALC, TRIG, CHOLHDL, LDLDIRECT in the last 72 hours. Thyroid Function Tests: No results for input(s): TSH, T4TOTAL, FREET4, T3FREE, THYROIDAB in the last 72 hours. Anemia Panel: Recent Labs    09/17/21 1022  FOLATE 11.0  FERRITIN 219  TIBC 108*  IRON 15*  RETICCTPCT 1.6   Urine analysis:    Component Value Date/Time   COLORURINE AMBER (A) 09/15/2021 1554   APPEARANCEUR HAZY (A) 09/15/2021 1554   LABSPEC 1.020 09/15/2021 1554   PHURINE 6.0 09/15/2021 1554   GLUCOSEU NEGATIVE 09/15/2021 1554    HGBUR SMALL (A) 09/15/2021 1554   BILIRUBINUR NEGATIVE 09/15/2021 1554   KETONESUR NEGATIVE 09/15/2021 1554   PROTEINUR NEGATIVE 09/15/2021 1554   NITRITE NEGATIVE 09/15/2021 [place] 09/15/2021 1554   Sepsis Labs: '@LABRCNTIP'$ (procalcitonin:4,lacticidven:4) ) Recent Results (from the past 240 hour(s))  Blood culture (routine x 2)     Status: None (Preliminary result)   Collection Time: 09/17/21  5:12 AM   Specimen: BLOOD  Result Value Ref Range Status   Specimen Description BLOOD BLOOD LEFT WRIST  Final   Special Requests   Final    BOTTLES DRAWN AEROBIC AND ANAEROBIC Blood Culture adequate volume   Culture  Setup Time   Final    GRAM POSITIVE COCCI IN BOTH AEROBIC AND ANAEROBIC BOTTLES CRITICAL RESULT CALLED TO, READ BACK BY AND VERIFIED WITH: Performed at [facility], [address], [place], [place] [address]    Culture GRAM POSITIVE COCCI  Final   Report Status PENDING  Incomplete  Blood culture (routine x 2)     Status: None (Preliminary result)   Collection Time: 09/17/21  5:12 AM   Specimen: BLOOD  Result Value Ref Range Status   Specimen Description BLOOD BLOOD LEFT FOREARM  Final   Special Requests   Final    BOTTLES DRAWN AEROBIC AND ANAEROBIC Blood Culture results may not be optimal due to an inadequate volume of blood received in culture bottles   Culture  Setup Time   Final    Organism ID to follow [place] CRITICAL RESULT CALLED TO, READ BACK BY AND VERIFIED WITH: Performed at [facility], [address], [place], [place] [address]    Culture GRAM POSITIVE COCCI  Final   Report Status PENDING  Incomplete  Expectorated Sputum Assessment w Gram Stain, Rflx to Resp Cult     Status: None   Collection Time: 09/17/21  8:57 AM   Specimen: Expectorated Sputum  Result Value Ref Range Status   Specimen Description EXPECTORATED SPUTUM  Final   Special Requests NONE  Final    Sputum evaluation   Final    THIS SPECIMEN IS ACCEPTABLE FOR SPUTUM CULTURE Performed at [facility], [address] [place], [place] [address]    Report Status 09/17/2021 FINAL  Final  Resp Panel by RT-PCR (Flu A&B, Covid) Nasopharyngeal Swab     Status: None   Collection Time: 09/17/21  8:57 AM   Specimen: Nasopharyngeal Swab; Nasopharyngeal(NP) swabs in vial transport medium  Result Value Ref Range Status   SARS Coronavirus 2 by RT PCR NEGATIVE  NEGATIVE Final    Comment: (NOTE) SARS-CoV-2 target nucleic acids are NOT DETECTED.  The SARS-CoV-2 RNA is generally detectable in upper respiratory specimens during the acute phase of infection. The lowest concentration of SARS-CoV-2 viral copies this assay can detect is 138 copies/mL. A negative result does not preclude SARS-Cov-2 infection and should not be used as the sole basis for treatment or other patient management decisions. A negative result may occur with  improper specimen collection/handling, submission of specimen other than nasopharyngeal swab, presence of viral mutation(s) within the areas targeted by this assay, and inadequate number of viral copies(<138 copies/mL). A negative result must be combined with clinical observations, patient history, and epidemiological information. The expected result is Negative.  Fact Sheet for Patients:  EntrepreneurPulse.com.au  Fact Sheet for Healthcare Providers:  IncredibleEmployment.be  This test is no t yet approved or cleared by the [place] FDA and  has been authorized for detection and/or diagnosis of SARS-CoV-2 by FDA under an Emergency Use Authorization (EUA). This EUA will remain  in effect (meaning this test can be used) for the duration of the COVID-19 declaration under Section 564(b)(1) of the Act, 21 U.S.C.section 360bbb-3(b)(1), unless the authorization is terminated  or revoked sooner.       Influenza A by PCR  NEGATIVE NEGATIVE Final   Influenza B by PCR NEGATIVE NEGATIVE Final    Comment: (NOTE) The Xpert Xpress SARS-CoV-2/FLU/RSV plus assay is intended as an aid in the diagnosis of influenza from Nasopharyngeal swab specimens and should not be used as a sole basis for treatment. Nasal washings and aspirates are unacceptable for Xpert Xpress SARS-CoV-2/FLU/RSV testing.  Fact Sheet for Patients: EntrepreneurPulse.com.au  Fact Sheet for Healthcare Providers: IncredibleEmployment.be  This test is not yet approved or cleared by the [place] FDA and has been authorized for detection and/or diagnosis of SARS-CoV-2 by FDA under an Emergency Use Authorization (EUA). This EUA will remain in effect (meaning this test can be used) for the duration of the COVID-19 declaration under Section 564(b)(1) of the Act, 21 U.S.C. section 360bbb-3(b)(1), unless the authorization is terminated or revoked.  Performed at [facility], [address] [place], [place] [address]   Culture, Respiratory w Gram Stain     Status: None (Preliminary result)   Collection Time: 09/17/21  8:57 AM  Result Value Ref Range Status   Specimen Description   Final    EXPECTORATED SPUTUM Performed at [facility], [address], [place], [place] [address]    Special Requests   Final    NONE Reflexed from [id number] Performed at [facility], [place], [place] [address]    Gram Stain   Final    RARE NO ORGANISMS SEEN RARE GRAM POSITIVE COCCI IN PAIRS RARE GRAM VARIABLE ROD Performed at [facility], [place] [address], [place], [place] [address]    Culture PENDING  Incomplete   Report Status PENDING  Incomplete     Radiological Exams on Admission: CT Angio Chest PE W and/or Wo Contrast  Result Date: 09/17/2021 CLINICAL DATA:  Pulmonary embolism (PE) suspected, high prob. Hematuria, dyspnea EXAM: CT ANGIOGRAPHY CHEST WITH CONTRAST  TECHNIQUE: Multidetector CT imaging of the chest was performed using the standard protocol during bolus administration of intravenous contrast. Multiplanar CT image reconstructions and MIPs were obtained to evaluate the vascular anatomy. RADIATION DOSE REDUCTION: This exam was performed according to the departmental dose-optimization program which includes automated exposure control, adjustment of the mA and/or kV  according to patient size and/or use of iterative reconstruction technique. CONTRAST:  267m OMNIPAQUE IOHEXOL 350 MG/ML SOLN (due to requirement for second bolus) COMPARISON:  None Available. FINDINGS: Cardiovascular: Technically limited examination due to bolus timing, body habitus, and respiratory motion artifact. No intraluminal filling defect identified within the main, right, and left pulmonary arteries. Lobar, segmental, and subsegmental pulmonary arteries are not adequately opacified to definitively exclude the presence of acute pulmonary emboli. Central pulmonary arteries are enlarged in keeping with changes of pulmonary arterial hypertension. No significant coronary artery calcification. Global cardiac size within normal limits. No pericardial effusion. Mild atherosclerotic calcification within the thoracic aorta. No aortic aneurysm. Mediastinum/Nodes: There is right axillary adenopathy identified as well as numerous enhancing lymph nodes within the right axilla demonstrating a matted appearance with perinodal infiltration likely related to inflammation or nodal metastatic disease. There is asymmetric edema within the right axilla noted. Right hilar adenopathy is present, possibly reactive in nature. Visualized thyroid is unremarkable. Esophagus is unremarkable. Lungs/Pleura: There is multifocal consolidation within the lungs bilaterally, in keeping with changes of acute multifocal pneumonia in the appropriate clinical setting. No pneumothorax or pleural effusion. Central airways are widely  patent. Upper Abdomen: No acute abnormality. Musculoskeletal: No chest wall abnormality. No acute or significant osseous findings. Review of the MIP images confirms the above findings. IMPRESSION: Technically limited examination without evidence of large central pulmonary embolus. Lobar and peripheral pulmonary arterial vasculature is not adequately opacified on this examination to definitively exclude the presence of acute pulmonary embolus. Morphologic changes in keeping with pulmonary arterial hypertension. Multifocal pulmonary consolidation in keeping with multifocal pneumonic consolidation in the appropriate clinical setting. No central obstructing lesion. Probable reactive right hilar adenopathy. Enhancing right axillary adenopathy demonstrating perinodal infiltration in keeping with acute infection, inflammation or neoplastic infiltration. Asymmetric right axillary edema. Correlation with clinical examination including right breast examination is recommended. Electronically Signed   By: AFidela SalisburyM.D.   On: 09/17/2021 04:34      Assessment/Plan Principal Problem:   Multifocal pneumonia Active Problems:   Sepsis ([place]   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[place]   Pulmonary embolism (HCC)   HTN (hypertension)   Urinary retention   Iron deficiency anemia   Hematuria   Morbid obesity with body mass index of 70 and over in adult ([facility]   Principal Problem:   Multifocal pneumonia Active Problems:   Sepsis ([facility]   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[facility]   Pulmonary embolism (HCC)   HTN (hypertension)   Urinary retention   Iron deficiency anemia   Hematuria   Morbid obesity with body mass index of 70 and over in adult ([facility]  Addendum:  BCID updated with 4 of 4 vials GPCs > Staph Aureus (Mec A/C & MREJ resistances) > MRSA. Pt currently only on Ceftriaxone and Azithromycin. Will change ceftriaxone to vancomycin.    Assessment and Plan: * Multifocal pneumonia Possible multifocal pneumonia and sepsis due to  pneumonia: CTA is limited study, did not show central PE, showed multifocal infiltration.  Patient has leukocytosis with WBC 16.7, indicating possible multifocal pneumonia.  Patient meets criteria for sepsis with WBC 16.7, heart rate 114, 26.  Lactic acid normal 0.9, 1.4.  - Will admit to tele bed as inpt - IV Rocephin and azithromycin (pt received one dose of IV Vancomycin and cefepime in ED) - Incentive  spirometry - Mucinex for cough  - Bronchodilators - Urine legionella and S. pneumococcal antigen - Follow up blood culture x2, sputum culture  - will get  Procalcitonin - IVF: 1L of LR bolus in ED  Sepsis ([place]) See above  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[place]) Patient has some morbid obesity, very difficult to assess volume status.  BNP is normal CT 7.1.  2D echo on 08/16/21: showed normal left ventricular systolic function with mild LVH, moderate RV systolic dysfunction. -Patient received 40 mg Lasix in the ED -Will continue torsemide 20 mg daily    Pulmonary embolism ([place]) Patient is on Lovenox.  Since hemoglobin dropped from 8.6-7.7, possibly due to hematuria.  Will switch Lovenox to IV heparin, in case patient has worsening bleeding.  Currently no continues hematuria -IV heparin  HTN (hypertension) - IV hydralazine as needed -Patient is on torsemide  Iron deficiency anemia Hemoglobin 8.6 on 09/15/2021, 7.7 today.  Hemoglobin drop is likely due to hematuria. -Continue iron supplement -Check CBC every 8 hours  Hematuria Urinalysis negative. Overall suspecting that the Foley balloon may have been in the urethra causing some local bleeding,  no evidence of ongoing hematuria. -monitoring Hgb level by q8h CBC  Morbid obesity with body mass index of 70 and over in adult ([place]) BMI=78.27   and BW= 206.8 -Diet and exercise.   -Encouraged to lose weight.            DVT ppx: on IV Heparin    Code Status: Full code  Family Communication: not done, no family member is at bed side.    Patient states that her sister knows what is going on for her, states that I do not need to call her family.  Disposition Plan:  likely go to SNF  Consults called:  none  Admission status and Level of care: Telemetry Medical:   as inpt     Severity of Illness:  The appropriate patient status for this patient is INPATIENT. Inpatient status is judged to be reasonable and necessary in order to provide the required intensity of service to ensure the patient's safety. The patient's presenting symptoms, physical exam findings, and initial radiographic and laboratory data in the context of their chronic comorbidities is felt to place them at high risk for further clinical deterioration. Furthermore, it is not anticipated that the patient will be medically stable for discharge from the hospital within 2 midnights of admission.   * I certify that at the point of admission it is my clinical judgment that the patient will require inpatient hospital care spanning beyond 2 midnights from the point of admission due to high intensity of service, high risk for further deterioration and high frequency of surveillance required.*       Date of Service 09/17/2021    Ivor Costa [facility]   If 7PM-7AM, please contact night-coverage [email or web address] 09/17/2021, 6:08 PM

## 2021-09-17 NOTE — Consult Note (Signed)
Pharmacy Antibiotic Note  Krystal Francis is a 61 y.o. female w/ h/o RV failure, HTN, GERD, PE PTA on Lovenox, iron deficiency anemia, urinary retention with Foley catheter placement, uterine bleeding, morbid obesity with BMI 78.27, who presents with shortness of breath > w/u c/w PE & multifocal PNA. Pt admitted on 09/15/2021 with pneumonia and bacteremia & BCID resulted with MRSA 4 of 4 vials on 5/20.  Pharmacy has been consulted for escalation of abx to Vancomycin dosing.  Plan: -- BCID resulted with MRSA 4 of 4 vials on 5/20, F/u cultures for further speciation and susceptibilities. Consider early vanc level monitoring at steady state given BMI and q12h dosing. CRO > Vancomycin. Pt received 1st dose of Vancomycin 2g x1 in ED (5/20 0630); Will plan for vancomycin 1.5g q12h (5/20 1900>> Goal AUC 400-550  Est AUC: 494.5; Cmax: 28.7; Cmin: 15.5 SCr 0.8 (rounded from 0.62; IBW; Vd 0.5 Pt continues on azithromycin '500mg'$  IV q24 (5/20-5/25)     Height: '5\' 4"'$  (162.6 cm) Weight: (!) 206.8 kg (456 lb) IBW/kg (Calculated) : 54.7  Temp (24hrs), Avg:98.4 F (36.9 C), Min:97.7 F (36.5 C), Max:99.2 F (37.3 C)  Recent Labs  Lab 09/15/21 1328 09/17/21 0050 09/17/21 0606 09/17/21 1605  WBC 15.0* 16.7*  --  16.4*  CREATININE 0.62  --   --   --   LATICACIDVEN  --  0.9 1.4  --     Estimated Creatinine Clearance: 138.1 mL/min (by C-G formula based on SCr of 0.62 mg/dL).    No Known Allergies  Antimicrobials this admission: VAN (5/20 >>  Azithro (5/20-5/25) CRO x1 in ED (5/20)  Dose adjustments this admission: CTM and adjust PRN  Microbiology results: 5/20 BCx: BCID 4 of 4 vials MRSA 5/20 Sputum: GPCs and GvarR  5/20 Flu/Cov PCR: negative  Thank you for allowing pharmacy to be a part of this patient's care.  Lorna Dibble 09/17/2021 6:45 PM

## 2021-09-17 NOTE — ED Notes (Signed)
Pt sleeping. 

## 2021-09-17 NOTE — ED Notes (Signed)
Waiting on lab to obtain blood cultures to hand antibiotics

## 2021-09-17 NOTE — ED Notes (Signed)
Pt states shob improved.

## 2021-09-17 NOTE — Assessment & Plan Note (Addendum)
   Patient is on Lovenox, hx PE and recent DVT RUE s/p thrombectomy  Plan to transition to Xarelto given BMI

## 2021-09-17 NOTE — Assessment & Plan Note (Addendum)
Urinalysis negative. Overall suspecting that the Foley balloon may have been in the urethra causing some local bleeding,  no evidence of ongoing hematuria. Pt also has intermittent vaginal bleeding.  Monitor Hbg

## 2021-09-17 NOTE — Assessment & Plan Note (Addendum)
Hemoglobin 8.6 on 09/15/2021, 7.7 today.  Hemoglobin drop is likely due to vaginal bleeding which is chronic.  She also had hematuria earlier this admission which has resolved.  Continue iron supplement  uterine bleeding and need for anticoagulation are also contributing   May need transfer to [facility] for [facility]BGYN to eval for hysterectomy if Hgb dropping but has been stable for now

## 2021-09-17 NOTE — Consult Note (Signed)
[facility] for Heparin Indication: pulmonary embolus  No Known Allergies  Patient Measurements: Height: '5\' 4"'$  (162.6 cm) Weight: (!) 206.8 kg (456 lb) IBW/kg (Calculated) : 54.7 Heparin Dosing Weight: 109 kg  Vital Signs: Temp: 98 F (36.7 C) (05/20 0603) Temp Source: Oral (05/20 0603) BP: 96/53 (05/20 0603) Pulse Rate: 97 (05/20 0603)  Labs: Recent Labs    09/15/21 1328 09/17/21 0050 09/17/21 0425 09/17/21 0606  HGB 8.6* 7.7*  --   --   HCT 30.1* 26.5*  --   --   PLT 212 274  --   --   CREATININE 0.62  --   --   --   TROPONINIHS  --  '8 8 8    '$ Estimated Creatinine Clearance: 138.1 mL/min (by C-G formula based on SCr of 0.62 mg/dL).   Medical History: Past Medical History:  Diagnosis Date   Anemia, unspecified    Hypertension     Medications:  Medications Prior to Admission  Medication Sig Dispense Refill Last Dose   enoxaparin (LOVENOX) 120 MG/0.8ML injection Inject 120 mg into the skin daily.   09/14/2021   ferrous sulfate 325 (65 FE) MG EC tablet Take 1 tablet (325 mg total) by mouth 3 (three) times daily with meals. 90 tablet 3 09/14/2021   furosemide (LASIX) 40 MG tablet Take 40 mg by mouth daily.   09/14/2021   magnesium oxide (MAG-OX) 400 (240 Mg) MG tablet Take 1 tablet by mouth daily.   09/14/2021   medroxyPROGESTERone (PROVERA) 10 MG tablet Take 10 mg by mouth in the morning and at bedtime.   09/14/2021   pantoprazole (PROTONIX) 40 MG tablet Take 40 mg by mouth daily.   09/14/2021   torsemide (DEMADEX) 20 MG tablet Take 20 mg by mouth daily.   09/14/2021   vitamin B-12 (CYANOCOBALAMIN) 1000 MCG tablet Take by mouth.   09/14/2021   Scheduled:   Chlorhexidine Gluconate Cloth  6 each Topical Daily   ferrous sulfate  325 mg Oral Q breakfast   guaiFENesin  600 mg Oral BID   ipratropium-albuterol  3 mL Nebulization QID   magnesium oxide  400 mg Oral Daily   medroxyPROGESTERone  10 mg Oral BID   pantoprazole  40 mg Oral  Daily   torsemide  20 mg Oral Daily   Infusions:   sodium chloride 100 mL/hr at 09/17/21 0710   azithromycin     cefTRIAXone (ROCEPHIN)  IV     vancomycin 2,000 mg (09/17/21 0626)   PRN: acetaminophen **OR** acetaminophen, albuterol, chlorpheniramine-HYDROcodone, hydrALAZINE, HYDROcodone-acetaminophen, magnesium hydroxide, ondansetron **OR** ondansetron (ZOFRAN) IV, traZODone Anti-infectives (From admission, onward)    Start     Dose/Rate Route Frequency Ordered Stop   09/17/21 1200  cefTRIAXone (ROCEPHIN) 2 g in sodium chloride 0.9 % 100 mL IVPB        2 g 200 mL/hr over 30 Minutes Intravenous Every 24 hours 09/17/21 0622 09/22/21 1159   09/17/21 1000  azithromycin (ZITHROMAX) 500 mg in sodium chloride 0.9 % 250 mL IVPB        500 mg 250 mL/hr over 60 Minutes Intravenous Every 24 hours 09/17/21 0622 09/22/21 0959   09/17/21 0445  ceFEPIme (MAXIPIME) 1 g in sodium chloride 0.9 % 100 mL IVPB        1 g 200 mL/hr over 30 Minutes Intravenous  Once 09/17/21 0441 09/17/21 0542   09/17/21 0445  vancomycin (VANCOREADY) IVPB 2000 mg/400 mL  2,000 mg 200 mL/hr over 120 Minutes Intravenous  Once 09/17/21 0441         Assessment: Pharmacy consulted to start heparin for VTE. Pt was diagnosis in April 2023 per MD. Pt was discharge from [facility] on Enoxaparin 120 mg BID. Last anti-xa level was 0.75 (therapeutic). Pt has some hematuria so switching enoxaparin to heparin.   Goal of Therapy:  Heparin level 0.3-0.7 units/ml Monitor platelets by anticoagulation protocol: Yes   Plan:  Start heparin infusion at 1700 units/hr at 2100 as pt received enoxaparin 120 mg x 1 5/19 '@2145'$ .  Check anti-Xa level in 6 hours and daily while on heparin Continue to monitor H&H and platelets  Oswald Hillock, PharmD, BCPS 09/17/2021,8:12 AM

## 2021-09-17 NOTE — ED Provider Notes (Signed)
Patient had been boarding in the ED for 35 hours when I was called in the room because patient was feeling short of breath and was more tachycardic.  Repeat EKG did not show any changes.  Repeat troponin was negative.  It was noticed at that point to the patient had been boarding for 30 hours in the ED without her home meds order and therefore had not been receiving her Lasix and Lovenox for a recent diagnosis of pulmonary embolism.  Therefore patient was sent for a repeat CT angio of the chest.  The CT is limited due to body habitus but does not show any large PEs.  The CT is concerning for multifocal pneumonia.  This finding together with leukocytosis, tachycardia and tachypnea makes me concerned for sepsis.  Patient has a history of diastolic CHF and has missed her Lasix for the last 30 hours therefore we will give only 1 L bolus.  Patient did receive her Lasix this evening and her Lovenox this evening.  Will cover with cefepime and Vanco for possible healthcare associated pneumonia.  Hospitalist was consulted and after discussion accepted patient to their service   ED ECG REPORT I, Rudene Re, the attending physician, personally viewed and interpreted this ECG.  Sinus tachycardia, rate of 108, no ST elevations or depressions   CRITICAL CARE Performed by: Rudene Re  ?  Total critical care time: 30 min  Critical care time was exclusive of separately billable procedures and treating other patients.  Critical care was necessary to treat or prevent imminent or life-threatening deterioration.  Critical care was time spent personally by me on the following activities: development of treatment plan with patient and/or surrogate as well as nursing, discussions with consultants, evaluation of patient's response to treatment, examination of patient, obtaining history from patient or surrogate, ordering and performing treatments and interventions, ordering and review of laboratory studies,  ordering and review of radiographic studies, pulse oximetry and re-evaluation of patient's condition.   I, Rudene Re, attending MD, have personally viewed and interpreted the images obtained during this visit as below:  CT showing multifocal pneumonia   ___________________________________________________ Interpretation by Radiologist:  CT Angio Chest PE W and/or Wo Contrast  Result Date: 09/17/2021 CLINICAL DATA:  Pulmonary embolism (PE) suspected, high prob. Hematuria, dyspnea EXAM: CT ANGIOGRAPHY CHEST WITH CONTRAST TECHNIQUE: Multidetector CT imaging of the chest was performed using the standard protocol during bolus administration of intravenous contrast. Multiplanar CT image reconstructions and MIPs were obtained to evaluate the vascular anatomy. RADIATION DOSE REDUCTION: This exam was performed according to the departmental dose-optimization program which includes automated exposure control, adjustment of the mA and/or kV according to patient size and/or use of iterative reconstruction technique. CONTRAST:  252m OMNIPAQUE IOHEXOL 350 MG/ML SOLN (due to requirement for second bolus) COMPARISON:  None Available. FINDINGS: Cardiovascular: Technically limited examination due to bolus timing, body habitus, and respiratory motion artifact. No intraluminal filling defect identified within the main, right, and left pulmonary arteries. Lobar, segmental, and subsegmental pulmonary arteries are not adequately opacified to definitively exclude the presence of acute pulmonary emboli. Central pulmonary arteries are enlarged in keeping with changes of pulmonary arterial hypertension. No significant coronary artery calcification. Global cardiac size within normal limits. No pericardial effusion. Mild atherosclerotic calcification within the thoracic aorta. No aortic aneurysm. Mediastinum/Nodes: There is right axillary adenopathy identified as well as numerous enhancing lymph nodes within the right axilla  demonstrating a matted appearance with perinodal infiltration likely related to inflammation or nodal metastatic disease.  There is asymmetric edema within the right axilla noted. Right hilar adenopathy is present, possibly reactive in nature. Visualized thyroid is unremarkable. Esophagus is unremarkable. Lungs/Pleura: There is multifocal consolidation within the lungs bilaterally, in keeping with changes of acute multifocal pneumonia in the appropriate clinical setting. No pneumothorax or pleural effusion. Central airways are widely patent. Upper Abdomen: No acute abnormality. Musculoskeletal: No chest wall abnormality. No acute or significant osseous findings. Review of the MIP images confirms the above findings. IMPRESSION: Technically limited examination without evidence of large central pulmonary embolus. Lobar and peripheral pulmonary arterial vasculature is not adequately opacified on this examination to definitively exclude the presence of acute pulmonary embolus. Morphologic changes in keeping with pulmonary arterial hypertension. Multifocal pulmonary consolidation in keeping with multifocal pneumonic consolidation in the appropriate clinical setting. No central obstructing lesion. Probable reactive right hilar adenopathy. Enhancing right axillary adenopathy demonstrating perinodal infiltration in keeping with acute infection, inflammation or neoplastic infiltration. Asymmetric right axillary edema. Correlation with clinical examination including right breast examination is recommended. Electronically Signed   By: Fidela Salisbury M.D.   On: 09/17/2021 04:34         Rudene Re, MD 09/17/21 [phone]

## 2021-09-17 NOTE — Consult Note (Signed)
ANTICOAGULATION CONSULT NOTE   Pharmacy Consult for Heparin Indication: pulmonary embolus  No Known Allergies  Patient Measurements: Height: '5\' 4"'$  (162.6 cm) Weight: (!) 206.8 kg (456 lb) IBW/kg (Calculated) : 54.7 Heparin Dosing Weight: 109 kg  Vital Signs: Temp: 99.2 F (37.3 C) (05/20 1647) Temp Source: Oral (05/20 1647) BP: 96/57 (05/20 1647) Pulse Rate: 107 (05/20 1647)  Labs: Recent Labs    09/15/21 1328 09/17/21 0050 09/17/21 0425 09/17/21 0606 09/17/21 1022 09/17/21 1600 09/17/21 1605  HGB 8.6* 7.7*  --   --   --   --  7.3*  HCT 30.1* 26.5*  --   --   --   --  25.0*  PLT 212 274  --   --   --   --  268  APTT  --   --   --   --  38*  --   --   LABPROT  --   --   --   --  18.0*  --   --   INR  --   --   --   --  1.5*  --   --   HEPARINUNFRC  --   --   --   --   --  0.13*  --   CREATININE 0.62  --   --   --   --   --   --   TROPONINIHS  --  '8 8 8  '$ --   --   --      Estimated Creatinine Clearance: 138.1 mL/min (by C-G formula based on SCr of 0.62 mg/dL).   Medical History: Past Medical History:  Diagnosis Date   Anemia, unspecified    Hypertension     Medications:  Medications Prior to Admission  Medication Sig Dispense Refill Last Dose   enoxaparin (LOVENOX) 120 MG/0.8ML injection Inject 120 mg into the skin daily.   09/14/2021   ferrous sulfate 325 (65 FE) MG EC tablet Take 1 tablet (325 mg total) by mouth 3 (three) times daily with meals. 90 tablet 3 09/14/2021   furosemide (LASIX) 40 MG tablet Take 40 mg by mouth daily.   09/14/2021   magnesium oxide (MAG-OX) 400 (240 Mg) MG tablet Take 1 tablet by mouth daily.   09/14/2021   medroxyPROGESTERone (PROVERA) 10 MG tablet Take 10 mg by mouth in the morning and at bedtime.   09/14/2021   pantoprazole (PROTONIX) 40 MG tablet Take 40 mg by mouth daily.   09/14/2021   torsemide (DEMADEX) 20 MG tablet Take 20 mg by mouth daily.   09/14/2021   vitamin B-12 (CYANOCOBALAMIN) 1000 MCG tablet Take by mouth.    09/14/2021   Scheduled:   Chlorhexidine Gluconate Cloth  6 each Topical Daily   ferrous sulfate  325 mg Oral Q breakfast   guaiFENesin  600 mg Oral BID   ipratropium-albuterol  3 mL Nebulization TID   magnesium oxide  400 mg Oral Daily   medroxyPROGESTERone  10 mg Oral BID   pantoprazole  40 mg Oral Daily   torsemide  20 mg Oral Daily   Infusions:   azithromycin 500 mg (09/17/21 0956)   cefTRIAXone (ROCEPHIN)  IV 2 g (09/17/21 1144)   heparin 1,700 Units/hr (09/17/21 1020)   PRN: acetaminophen **OR** acetaminophen, albuterol, chlorpheniramine-HYDROcodone, hydrALAZINE, HYDROcodone-acetaminophen, magnesium hydroxide, ondansetron **OR** ondansetron (ZOFRAN) IV, traZODone Anti-infectives (From admission, onward)    Start     Dose/Rate Route Frequency Ordered Stop   09/17/21 1200  cefTRIAXone (ROCEPHIN) 2  g in sodium chloride 0.9 % 100 mL IVPB        2 g 200 mL/hr over 30 Minutes Intravenous Every 24 hours 09/17/21 0622 09/22/21 1159   09/17/21 1000  azithromycin (ZITHROMAX) 500 mg in sodium chloride 0.9 % 250 mL IVPB        500 mg 250 mL/hr over 60 Minutes Intravenous Every 24 hours 09/17/21 0622 09/22/21 0959   09/17/21 0445  ceFEPIme (MAXIPIME) 1 g in sodium chloride 0.9 % 100 mL IVPB        1 g 200 mL/hr over 30 Minutes Intravenous  Once 09/17/21 0441 09/17/21 0542   09/17/21 0445  vancomycin (VANCOREADY) IVPB 2000 mg/400 mL        2,000 mg 200 mL/hr over 120 Minutes Intravenous  Once 09/17/21 0441 09/17/21 0826     Heparin Dosing Weight: 109 kg  Assessment: 61yo F w/ h/o RV failure, HTN, GERD, PE PTA on Lovenox, iron deficiency anemia, urinary retention with Foley catheter placement, uterine bleeding, morbid obesity with BMI 78.27, who presents with shortness of breath. Pharmacy consulted to start heparin for VTE. Pt was diagnosis in April 2023 per MD. Pt was discharge from [facility] on Enoxaparin 120 mg BID. Last anti-xa level was 0.75 (therapeutic). Pt has some hematuria so  switching enoxaparin to heparin.   Note: PTA - pt received enoxaparin 120 mg x 1 5/19 '@2145'$ .   Date Time  HL Rate/Comment 5/20 1600 0.13 Subtherapeutic; 1700>2100 un/hr     Baseline Labs: aPTT - 38s INR - 1.5 Hgb - 7.7>7.3 Plts - 274>268  Goal of Therapy:  Heparin level 0.3-0.7 units/ml Monitor platelets by anticoagulation protocol: Yes   Plan:  Subtherapeutic 0.13 (goal 0.3-0.7) Bolus 3200 units; then increase heparin infusion rate to 2100 units/hr Check anti-Xa level in 6 hours and daily while on heparin Continue to monitor H&H and platelets  Lorna Dibble, PharmD, [facility] Clinical Pharmacist 09/17/2021 6:11 PM

## 2021-09-17 NOTE — Assessment & Plan Note (Addendum)
BMI=78.27   and BW= 206.8  Diet and exercise.    Encouraged to lose weight.  BMI and associated ambulatory dysfunction precludes safe placement home until equipment can be arranged

## 2021-09-17 NOTE — Progress Notes (Signed)
PHARMACY - PHYSICIAN COMMUNICATION CRITICAL VALUE ALERT - BLOOD CULTURE IDENTIFICATION (BCID)  Krystal Francis is a 61 y.o. female w/ h/o RV failure, HTN, GERD, PE PTA on Lovenox, iron deficiency anemia, urinary retention with Foley catheter placement, uterine bleeding, morbid obesity with BMI 78.27, who presents with shortness of breath > w/u c/w PE & multifocal PNA.   Assessment:  BCID updated with 4 of 4 vials GPCs > Staph Aureus (Mec A/C & MREJ resistances) > MRSA.    Name of physician (or Provider) Contacted: Dr. Blaine Hamper  Current antibiotics: Pt currently only on Ceftriaxone and Azithromycin.  Changes to prescribed antibiotics recommended:  Recommendations accepted by provider - Would recommend escalating ceftriaxone to vancomycin.  Results for orders placed or performed during the hospital encounter of 09/15/21  Blood Culture ID Panel (Reflexed) (Collected: 09/17/2021  5:12 AM)  Result Value Ref Range   Enterococcus faecalis NOT DETECTED NOT DETECTED   Enterococcus Faecium NOT DETECTED NOT DETECTED   Listeria monocytogenes NOT DETECTED NOT DETECTED   Staphylococcus species DETECTED (A) NOT DETECTED   Staphylococcus aureus (BCID) DETECTED (A) NOT DETECTED   Staphylococcus epidermidis NOT DETECTED NOT DETECTED   Staphylococcus lugdunensis NOT DETECTED NOT DETECTED   Streptococcus species NOT DETECTED NOT DETECTED   Streptococcus agalactiae NOT DETECTED NOT DETECTED   Streptococcus pneumoniae NOT DETECTED NOT DETECTED   Streptococcus pyogenes NOT DETECTED NOT DETECTED   A.calcoaceticus-baumannii NOT DETECTED NOT DETECTED   Bacteroides fragilis NOT DETECTED NOT DETECTED   Enterobacterales NOT DETECTED NOT DETECTED   Enterobacter cloacae complex NOT DETECTED NOT DETECTED   Escherichia coli NOT DETECTED NOT DETECTED   Klebsiella aerogenes NOT DETECTED NOT DETECTED   Klebsiella oxytoca NOT DETECTED NOT DETECTED   Klebsiella pneumoniae NOT DETECTED NOT DETECTED   Proteus species NOT DETECTED  NOT DETECTED   Salmonella species NOT DETECTED NOT DETECTED   Serratia marcescens NOT DETECTED NOT DETECTED   Haemophilus influenzae NOT DETECTED NOT DETECTED   Neisseria meningitidis NOT DETECTED NOT DETECTED   Pseudomonas aeruginosa NOT DETECTED NOT DETECTED   Stenotrophomonas maltophilia NOT DETECTED NOT DETECTED   Candida albicans NOT DETECTED NOT DETECTED   Candida auris NOT DETECTED NOT DETECTED   Candida glabrata NOT DETECTED NOT DETECTED   Candida krusei NOT DETECTED NOT DETECTED   Candida parapsilosis NOT DETECTED NOT DETECTED   Candida tropicalis NOT DETECTED NOT DETECTED   Cryptococcus neoformans/gattii NOT DETECTED NOT DETECTED   Meth resistant mecA/C and MREJ DETECTED (A) NOT DETECTED    Lorna Dibble 09/17/2021  6:41 PM

## 2021-09-17 NOTE — TOC Progression Note (Signed)
Transition of Care [facility]) - Progression Note    Patient Details  Name: Krystal Francis MRN: [id number] Date of Birth: Oct 25, 1960  Transition of Care [facility]) CM/SW Contact  [address] [place], [place] Phone Number: 09/17/2021, 12:39 PM  Clinical Narrative:    Patient contacted to discuss recommendation for rehab stay. Patient agreeable, does not have a facility preference. Fl2 and PASRR completed and faxed out. Will review bed offers once received.  [address] LCSW Transition of Care [phone]         Expected Discharge Plan and Services                                                 Social Determinants of Health (SDOH) Interventions    Readmission Risk Interventions     View : No data to display.

## 2021-09-17 NOTE — Assessment & Plan Note (Addendum)
Resolved

## 2021-09-18 ENCOUNTER — Inpatient Hospital Stay: Payer: Medicare Other

## 2021-09-18 DIAGNOSIS — A419 Sepsis, unspecified organism: Secondary | ICD-10-CM | POA: Diagnosis not present

## 2021-09-18 DIAGNOSIS — I2782 Chronic pulmonary embolism: Secondary | ICD-10-CM | POA: Diagnosis not present

## 2021-09-18 DIAGNOSIS — J189 Pneumonia, unspecified organism: Secondary | ICD-10-CM | POA: Diagnosis not present

## 2021-09-18 DIAGNOSIS — J9601 Acute respiratory failure with hypoxia: Secondary | ICD-10-CM

## 2021-09-18 LAB — CBC
HCT: 25.9 % — ABNORMAL LOW (ref 36.0–46.0)
HCT: 27.7 % — ABNORMAL LOW (ref 36.0–46.0)
Hemoglobin: 7.5 g/dL — ABNORMAL LOW (ref 12.0–15.0)
Hemoglobin: 8.1 g/dL — ABNORMAL LOW (ref 12.0–15.0)
MCH: 21.5 pg — ABNORMAL LOW (ref 26.0–34.0)
MCH: 21.6 pg — ABNORMAL LOW (ref 26.0–34.0)
MCHC: 29 g/dL — ABNORMAL LOW (ref 30.0–36.0)
MCHC: 29.2 g/dL — ABNORMAL LOW (ref 30.0–36.0)
MCV: 73.7 fL — ABNORMAL LOW (ref 80.0–100.0)
MCV: 74.4 fL — ABNORMAL LOW (ref 80.0–100.0)
Platelets: 274 10*3/uL (ref 150–400)
Platelets: 284 10*3/uL (ref 150–400)
RBC: 3.48 MIL/uL — ABNORMAL LOW (ref 3.87–5.11)
RBC: 3.76 MIL/uL — ABNORMAL LOW (ref 3.87–5.11)
RDW: 21.5 % — ABNORMAL HIGH (ref 11.5–15.5)
RDW: 21.9 % — ABNORMAL HIGH (ref 11.5–15.5)
WBC: 16.8 10*3/uL — ABNORMAL HIGH (ref 4.0–10.5)
WBC: 21.1 10*3/uL — ABNORMAL HIGH (ref 4.0–10.5)
nRBC: 0 % (ref 0.0–0.2)
nRBC: 0 % (ref 0.0–0.2)

## 2021-09-18 LAB — BLOOD CULTURE ID PANEL (REFLEXED) - BCID2

## 2021-09-18 LAB — HIV ANTIBODY (ROUTINE TESTING W REFLEX): HIV Screen 4th Generation wRfx: NONREACTIVE

## 2021-09-18 LAB — HEPARIN LEVEL (UNFRACTIONATED)
Heparin Unfractionated: 0.17 IU/mL — ABNORMAL LOW (ref 0.30–0.70)
Heparin Unfractionated: 0.26 IU/mL — ABNORMAL LOW (ref 0.30–0.70)
Heparin Unfractionated: 0.32 IU/mL (ref 0.30–0.70)
Heparin Unfractionated: 0.33 IU/mL (ref 0.30–0.70)

## 2021-09-18 LAB — BASIC METABOLIC PANEL
Anion gap: 9 (ref 5–15)
BUN: 20 mg/dL (ref 6–20)
CO2: 27 mmol/L (ref 22–32)
Calcium: 8 mg/dL — ABNORMAL LOW (ref 8.9–10.3)
Chloride: 99 mmol/L (ref 98–111)
Creatinine, Ser: 0.78 mg/dL (ref 0.44–1.00)
GFR, Estimated: >60 mL/min (ref >60)
Glucose, Bld: 110 mg/dL — ABNORMAL HIGH (ref 70–99)
Potassium: 4.1 mmol/L (ref 3.5–5.1)
Sodium: 135 mmol/L (ref 135–145)

## 2021-09-18 LAB — MAGNESIUM: Magnesium: 1.6 mg/dL — ABNORMAL LOW (ref 1.7–2.4)

## 2021-09-18 LAB — STREP PNEUMONIAE URINARY ANTIGEN: Strep Pneumo Urinary Antigen: NEGATIVE

## 2021-09-18 MED ORDER — ENSURE ENLIVE PO LIQD
237.0000 mL | Freq: Two times a day (BID) | ORAL | Status: DC
  Administered 2021-09-18 – 2021-10-04 (×21): 237 mL via ORAL

## 2021-09-18 MED ORDER — POLYSACCHARIDE IRON COMPLEX 150 MG PO CAPS
150.0000 mg | ORAL_CAPSULE | Freq: Every day | ORAL | Status: DC
  Administered 2021-09-18 – 2021-10-13 (×25): 150 mg via ORAL
  Filled 2021-09-18 (×27): qty 1

## 2021-09-18 MED ORDER — MAGNESIUM SULFATE 2 GM/50ML IV SOLN
2.0000 g | Freq: Once | INTRAVENOUS | Status: AC
  Administered 2021-09-18: 2 g via INTRAVENOUS
  Filled 2021-09-18: qty 50

## 2021-09-18 MED ORDER — ADULT MULTIVITAMIN W/MINERALS CH
1.0000 | ORAL_TABLET | Freq: Every day | ORAL | Status: DC
  Administered 2021-09-18 – 2021-10-13 (×24): 1 via ORAL
  Filled 2021-09-18 (×24): qty 1

## 2021-09-18 MED ORDER — HEPARIN BOLUS VIA INFUSION
1600.0000 [IU] | Freq: Once | INTRAVENOUS | Status: AC
  Administered 2021-09-18: 1600 [IU] via INTRAVENOUS
  Filled 2021-09-18: qty 1600

## 2021-09-18 MED ORDER — HYDROMORPHONE HCL 1 MG/ML IJ SOLN
0.5000 mg | INTRAMUSCULAR | Status: DC | PRN
  Administered 2021-09-20: 0.5 mg via INTRAVENOUS
  Filled 2021-09-18: qty 1

## 2021-09-18 MED ORDER — HYDROMORPHONE HCL 1 MG/ML IJ SOLN
0.5000 mg | Freq: Once | INTRAMUSCULAR | Status: AC
  Administered 2021-09-18: 0.5 mg via INTRAVENOUS
  Filled 2021-09-18: qty 1

## 2021-09-18 MED ORDER — IPRATROPIUM-ALBUTEROL 0.5-2.5 (3) MG/3ML IN SOLN
3.0000 mL | Freq: Two times a day (BID) | RESPIRATORY_TRACT | Status: DC
Start: 2021-09-19 — End: 2021-09-22
  Administered 2021-09-19 – 2021-09-22 (×6): 3 mL via RESPIRATORY_TRACT
  Filled 2021-09-18 (×6): qty 3

## 2021-09-18 MED ORDER — HEPARIN BOLUS VIA INFUSION
3200.0000 [IU] | Freq: Once | INTRAVENOUS | Status: AC
  Administered 2021-09-18: 3200 [IU] via INTRAVENOUS
  Filled 2021-09-18: qty 3200

## 2021-09-18 NOTE — Progress Notes (Signed)
OT Cancellation Note  Patient Details Name: Krystal Francis MRN: [id number] DOB: Dec 23, 1960   Cancelled Treatment:    Reason Eval/Treat Not Completed: Other (comment). Duplicate orders received. Pt evaluated by OT this admission. Please see 09/16/21 evaluation for recommendations.    Dessie Coma, M.S. OTR/L  09/18/21, 1:06 PM  ascom [phone]

## 2021-09-18 NOTE — Consult Note (Signed)
ANTICOAGULATION CONSULT NOTE   Pharmacy Consult for Heparin Indication: pulmonary embolus  No Known Allergies  Patient Measurements: Height: '5\' 4"'$  (162.6 cm) Weight: (!) 206.8 kg (456 lb) IBW/kg (Calculated) : 54.7 Heparin Dosing Weight: 109 kg  Vital Signs: Temp: 98.3 F (36.8 C) (05/21 1127) Temp Source: Oral (05/21 1127) BP: 109/88 (05/21 1127) Pulse Rate: 103 (05/21 1127)  Labs: Recent Labs    09/17/21 0050 09/17/21 0425 09/17/21 0606 09/17/21 1022 09/17/21 1600 09/17/21 1605 09/18/21 0007 09/18/21 0817 09/18/21 1448  HGB 7.7*  --   --   --   --  7.3* 7.5* 8.1*  --   HCT 26.5*  --   --   --   --  25.0* 25.9* 27.7*  --   PLT 274  --   --   --   --  268 274 284  --   APTT  --   --   --  38*  --   --   --   --   --   LABPROT  --   --   --  18.0*  --   --   --   --   --   INR  --   --   --  1.5*  --   --   --   --   --   HEPARINUNFRC  --   --   --   --    < >  --  0.17* 0.33 0.26*  CREATININE  --   --   --   --   --   --  0.78  --   --   TROPONINIHS '8 8 8  '$ --   --   --   --   --   --    < > = values in this interval not displayed.     Estimated Creatinine Clearance: 138.1 mL/min (by C-G formula based on SCr of 0.78 mg/dL).   Medical History: Past Medical History:  Diagnosis Date   Anemia, unspecified    Hypertension     Medications:  Medications Prior to Admission  Medication Sig Dispense Refill Last Dose   enoxaparin (LOVENOX) 120 MG/0.8ML injection Inject 120 mg into the skin daily.   09/14/2021   ferrous sulfate 325 (65 FE) MG EC tablet Take 1 tablet (325 mg total) by mouth 3 (three) times daily with meals. 90 tablet 3 09/14/2021   furosemide (LASIX) 40 MG tablet Take 40 mg by mouth daily.   09/14/2021   magnesium oxide (MAG-OX) 400 (240 Mg) MG tablet Take 1 tablet by mouth daily.   09/14/2021   medroxyPROGESTERone (PROVERA) 10 MG tablet Take 10 mg by mouth in the morning and at bedtime.   09/14/2021   pantoprazole (PROTONIX) 40 MG tablet Take 40 mg by  mouth daily.   09/14/2021   torsemide (DEMADEX) 20 MG tablet Take 20 mg by mouth daily.   09/14/2021   vitamin B-12 (CYANOCOBALAMIN) 1000 MCG tablet Take by mouth.   09/14/2021   Scheduled:   Chlorhexidine Gluconate Cloth  6 each Topical Daily   feeding supplement  237 mL Oral BID BM   guaiFENesin  600 mg Oral BID   [START ON 09/19/2021] ipratropium-albuterol  3 mL Nebulization BID   iron polysaccharides  150 mg Oral Daily   magnesium oxide  400 mg Oral Daily   medroxyPROGESTERone  10 mg Oral BID   multivitamin with minerals  1 tablet Oral Daily   pantoprazole  40 mg Oral Daily   torsemide  20 mg Oral Daily   Infusions:   heparin 2,400 Units/hr (09/18/21 0752)   vancomycin 1,500 mg (09/18/21 0634)   PRN: acetaminophen **OR** acetaminophen, albuterol, chlorpheniramine-HYDROcodone, hydrALAZINE, HYDROcodone-acetaminophen, magnesium hydroxide, ondansetron **OR** ondansetron (ZOFRAN) IV, traZODone Anti-infectives (From admission, onward)    Start     Dose/Rate Route Frequency Ordered Stop   09/17/21 1900  vancomycin (VANCOREADY) IVPB 1500 mg/300 mL        1,500 mg 150 mL/hr over 120 Minutes Intravenous Every 12 hours 09/17/21 1857     09/17/21 1200  cefTRIAXone (ROCEPHIN) 2 g in sodium chloride 0.9 % 100 mL IVPB  Status:  Discontinued        2 g 200 mL/hr over 30 Minutes Intravenous Every 24 hours 09/17/21 0622 09/17/21 1844   09/17/21 1000  azithromycin (ZITHROMAX) 500 mg in sodium chloride 0.9 % 250 mL IVPB  Status:  Discontinued        500 mg 250 mL/hr over 60 Minutes Intravenous Every 24 hours 09/17/21 0622 09/18/21 1247   09/17/21 0445  ceFEPIme (MAXIPIME) 1 g in sodium chloride 0.9 % 100 mL IVPB        1 g 200 mL/hr over 30 Minutes Intravenous  Once 09/17/21 0441 09/18/21 0743   09/17/21 0445  vancomycin (VANCOREADY) IVPB 2000 mg/400 mL        2,000 mg 200 mL/hr over 120 Minutes Intravenous  Once 09/17/21 0441 09/18/21 1437     Heparin Dosing Weight: 109 kg  Assessment: 61yo  F w/ h/o RV failure, HTN, GERD, PE PTA on Lovenox, iron deficiency anemia, urinary retention with Foley catheter placement, uterine bleeding, morbid obesity with BMI 78.27, who presents with shortness of breath. Pharmacy consulted to start heparin for VTE. Pt was diagnosis in April 2023 per MD. Pt was discharge from [facility] on Enoxaparin 120 mg BID. Last anti-xa level was 0.75 (therapeutic). Pt has some hematuria so switching enoxaparin to heparin.   Note: PTA - pt received enoxaparin 120 mg x 1 5/19 '@2145'$ .   Date Time  HL Rate/Comment 5/20 1600 0.13 Subtherapeutic; 1700>2100 un/hr  5/21 0007 0.17 Subtherapeutic; 2100>2400 un/hr  5/21 0817 0.33 Therapeutic x1; 2400 un/hr 5/21 1448 0.26 Subtherapeutic; 2400 > 2600 un/hr  Baseline Labs: aPTT - 38s INR - 1.5 Hgb - 77.3>8.1 Plts - 268>284  Goal of Therapy:  Heparin level 0.3-0.7 units/ml Monitor platelets by anticoagulation protocol: Yes   Plan:  Heparin level is slightly subtherapeutic.  Will bolus 1600 units x1; then increase heparin infusion rate to 2600 units/hr.  Recheck heparin level in 6 hours; then daily once consecutively therapeutic. CTM CBC daily while on heparin.   Lorna Dibble, PharmD, [facility] Clinical Pharmacist 09/18/2021 3:51 PM

## 2021-09-18 NOTE — Progress Notes (Addendum)
Krystal Glass, NP was notified about patients blood pressure of 88/42 MAP 57 HR110. No new orders were obtained. Will continue to recheck blood pressure per protocol.

## 2021-09-18 NOTE — Progress Notes (Incomplete)
       CROSS COVER NOTE  NAME: Krystal Francis MRN: [id number] DOB : 04/25/1961   BP 88/42  2212 Recheck 101/51  ***

## 2021-09-18 NOTE — Consult Note (Signed)
ANTICOAGULATION CONSULT NOTE   Pharmacy Consult for Heparin Indication: pulmonary embolus  No Known Allergies  Patient Measurements: Height: '5\' 4"'$  (162.6 cm) Weight: (!) 206.8 kg (456 lb) IBW/kg (Calculated) : 54.7 Heparin Dosing Weight: 109 kg  Vital Signs: Temp: 98.3 F (36.8 C) (05/21 1955) Temp Source: Oral (05/21 1127) BP: 101/51 (05/21 2212) Pulse Rate: 112 (05/21 2212)  Labs: Recent Labs    09/17/21 0050 09/17/21 0425 09/17/21 0606 09/17/21 1022 09/17/21 1600 09/17/21 1605 09/18/21 0007 09/18/21 0817 09/18/21 1448 09/18/21 2156  HGB 7.7*  --   --   --   --  7.3* 7.5* 8.1*  --   --   HCT 26.5*  --   --   --   --  25.0* 25.9* 27.7*  --   --   PLT 274  --   --   --   --  268 274 284  --   --   APTT  --   --   --  38*  --   --   --   --   --   --   LABPROT  --   --   --  18.0*  --   --   --   --   --   --   INR  --   --   --  1.5*  --   --   --   --   --   --   HEPARINUNFRC  --   --   --   --    < >  --  0.17* 0.33 0.26* 0.32  CREATININE  --   --   --   --   --   --  0.78  --   --   --   TROPONINIHS '8 8 8  '$ --   --   --   --   --   --   --    < > = values in this interval not displayed.     Estimated Creatinine Clearance: 138.1 mL/min (by C-G formula based on SCr of 0.78 mg/dL).   Medical History: Past Medical History:  Diagnosis Date   Anemia, unspecified    Hypertension     Medications:  Medications Prior to Admission  Medication Sig Dispense Refill Last Dose   enoxaparin (LOVENOX) 120 MG/0.8ML injection Inject 120 mg into the skin daily.   09/14/2021   ferrous sulfate 325 (65 FE) MG EC tablet Take 1 tablet (325 mg total) by mouth 3 (three) times daily with meals. 90 tablet 3 09/14/2021   furosemide (LASIX) 40 MG tablet Take 40 mg by mouth daily.   09/14/2021   magnesium oxide (MAG-OX) 400 (240 Mg) MG tablet Take 1 tablet by mouth daily.   09/14/2021   medroxyPROGESTERone (PROVERA) 10 MG tablet Take 10 mg by mouth in the morning and at bedtime.    09/14/2021   pantoprazole (PROTONIX) 40 MG tablet Take 40 mg by mouth daily.   09/14/2021   torsemide (DEMADEX) 20 MG tablet Take 20 mg by mouth daily.   09/14/2021   vitamin B-12 (CYANOCOBALAMIN) 1000 MCG tablet Take by mouth.   09/14/2021   Scheduled:   Chlorhexidine Gluconate Cloth  6 each Topical Daily   feeding supplement  237 mL Oral BID BM   guaiFENesin  600 mg Oral BID   [START ON 09/19/2021] ipratropium-albuterol  3 mL Nebulization BID   iron polysaccharides  150 mg Oral Daily   magnesium oxide  400 mg Oral Daily   medroxyPROGESTERone  10 mg Oral BID   multivitamin with minerals  1 tablet Oral Daily   pantoprazole  40 mg Oral Daily   torsemide  20 mg Oral Daily   Infusions:   heparin 2,600 Units/hr (09/18/21 1742)   vancomycin 1,500 mg (09/18/21 1839)   PRN: acetaminophen **OR** acetaminophen, albuterol, chlorpheniramine-HYDROcodone, hydrALAZINE, HYDROcodone-acetaminophen, HYDROmorphone (DILAUDID) injection, magnesium hydroxide, ondansetron **OR** ondansetron (ZOFRAN) IV, traZODone Anti-infectives (From admission, onward)    Start     Dose/Rate Route Frequency Ordered Stop   09/17/21 1900  vancomycin (VANCOREADY) IVPB 1500 mg/300 mL        1,500 mg 150 mL/hr over 120 Minutes Intravenous Every 12 hours 09/17/21 1857     09/17/21 1200  cefTRIAXone (ROCEPHIN) 2 g in sodium chloride 0.9 % 100 mL IVPB  Status:  Discontinued        2 g 200 mL/hr over 30 Minutes Intravenous Every 24 hours 09/17/21 0622 09/17/21 1844   09/17/21 1000  azithromycin (ZITHROMAX) 500 mg in sodium chloride 0.9 % 250 mL IVPB  Status:  Discontinued        500 mg 250 mL/hr over 60 Minutes Intravenous Every 24 hours 09/17/21 0622 09/18/21 1247   09/17/21 0445  ceFEPIme (MAXIPIME) 1 g in sodium chloride 0.9 % 100 mL IVPB        1 g 200 mL/hr over 30 Minutes Intravenous  Once 09/17/21 0441 09/18/21 0743   09/17/21 0445  vancomycin (VANCOREADY) IVPB 2000 mg/400 mL        2,000 mg 200 mL/hr over 120 Minutes  Intravenous  Once 09/17/21 0441 09/18/21 1437     Heparin Dosing Weight: 109 kg  Assessment: 61yo F w/ h/o RV failure, HTN, GERD, PE PTA on Lovenox, iron deficiency anemia, urinary retention with Foley catheter placement, uterine bleeding, morbid obesity with BMI 78.27, who presents with shortness of breath. Pharmacy consulted to start heparin for VTE. Pt was diagnosis in April 2023 per MD. Pt was discharge from [facility] on Enoxaparin 120 mg BID. Last anti-xa level was 0.75 (therapeutic). Pt has some hematuria so switching enoxaparin to heparin.   Note: PTA - pt received enoxaparin 120 mg x 1 5/19 '@2145'$ .   Date Time  HL Rate/Comment 5/20 1600 0.13 Subtherapeutic; 1700>2100 un/hr  5/21 0007 0.17 Subtherapeutic; 2100>2400 un/hr  5/21 0817 0.33 Therapeutic x1; 2400 un/hr 5/21 1448 0.26 Subtherapeutic; 2400 > 2600 un/hr 5/21 2156 0.32 Therapeutic x 1  Baseline Labs: aPTT - 38s INR - 1.5 Hgb - 77.3>8.1 Plts - 268>284  Goal of Therapy:  Heparin level 0.3-0.7 units/ml Monitor platelets by anticoagulation protocol: Yes   Plan:  Continue heparin infusion rate at 2600 units/hr.  Recheck HL in 6 hr to confirm; then daily once consecutively therapeutic. CTM CBC daily while on heparin.   Renda Rolls, PharmD, [facility] 09/18/2021 11:11 PM

## 2021-09-18 NOTE — Consult Note (Signed)
ANTICOAGULATION CONSULT NOTE   Pharmacy Consult for Heparin Indication: pulmonary embolus  No Known Allergies  Patient Measurements: Height: '5\' 4"'$  (162.6 cm) Weight: (!) 206.8 kg (456 lb) IBW/kg (Calculated) : 54.7 Heparin Dosing Weight: 109 kg  Vital Signs: Temp: 98.2 F (36.8 C) (05/20 2033) Temp Source: Oral (05/20 1647) BP: 103/60 (05/20 2033) Pulse Rate: 102 (05/20 2033)  Labs: Recent Labs    09/15/21 1328 09/17/21 0050 09/17/21 0425 09/17/21 0606 09/17/21 1022 09/17/21 1600 09/17/21 1605 09/18/21 0007  HGB 8.6* 7.7*  --   --   --   --  7.3* 7.5*  HCT 30.1* 26.5*  --   --   --   --  25.0* 25.9*  PLT 212 274  --   --   --   --  268 274  APTT  --   --   --   --  38*  --   --   --   LABPROT  --   --   --   --  18.0*  --   --   --   INR  --   --   --   --  1.5*  --   --   --   HEPARINUNFRC  --   --   --   --   --  0.13*  --  0.17*  CREATININE 0.62  --   --   --   --   --   --  0.78  TROPONINIHS  --  '8 8 8  '$ --   --   --   --      Estimated Creatinine Clearance: 138.1 mL/min (by C-G formula based on SCr of 0.78 mg/dL).   Medical History: Past Medical History:  Diagnosis Date   Anemia, unspecified    Hypertension     Medications:  Medications Prior to Admission  Medication Sig Dispense Refill Last Dose   enoxaparin (LOVENOX) 120 MG/0.8ML injection Inject 120 mg into the skin daily.   09/14/2021   ferrous sulfate 325 (65 FE) MG EC tablet Take 1 tablet (325 mg total) by mouth 3 (three) times daily with meals. 90 tablet 3 09/14/2021   furosemide (LASIX) 40 MG tablet Take 40 mg by mouth daily.   09/14/2021   magnesium oxide (MAG-OX) 400 (240 Mg) MG tablet Take 1 tablet by mouth daily.   09/14/2021   medroxyPROGESTERone (PROVERA) 10 MG tablet Take 10 mg by mouth in the morning and at bedtime.   09/14/2021   pantoprazole (PROTONIX) 40 MG tablet Take 40 mg by mouth daily.   09/14/2021   torsemide (DEMADEX) 20 MG tablet Take 20 mg by mouth daily.   09/14/2021   vitamin  B-12 (CYANOCOBALAMIN) 1000 MCG tablet Take by mouth.   09/14/2021   Scheduled:   Chlorhexidine Gluconate Cloth  6 each Topical Daily   ferrous sulfate  325 mg Oral Q breakfast   guaiFENesin  600 mg Oral BID   ipratropium-albuterol  3 mL Nebulization TID   magnesium oxide  400 mg Oral Daily   medroxyPROGESTERone  10 mg Oral BID   pantoprazole  40 mg Oral Daily   torsemide  20 mg Oral Daily   Infusions:   azithromycin 500 mg (09/17/21 0956)   heparin 2,100 Units/hr (09/17/21 2157)   vancomycin Stopped (09/17/21 2133)   PRN: acetaminophen **OR** acetaminophen, albuterol, chlorpheniramine-HYDROcodone, hydrALAZINE, HYDROcodone-acetaminophen, magnesium hydroxide, ondansetron **OR** ondansetron (ZOFRAN) IV, traZODone Anti-infectives (From admission, onward)    Start  Dose/Rate Route Frequency Ordered Stop   09/17/21 1900  vancomycin (VANCOREADY) IVPB 1500 mg/300 mL        1,500 mg 150 mL/hr over 120 Minutes Intravenous Every 12 hours 09/17/21 1857     09/17/21 1200  cefTRIAXone (ROCEPHIN) 2 g in sodium chloride 0.9 % 100 mL IVPB  Status:  Discontinued        2 g 200 mL/hr over 30 Minutes Intravenous Every 24 hours 09/17/21 0622 09/17/21 1844   09/17/21 1000  azithromycin (ZITHROMAX) 500 mg in sodium chloride 0.9 % 250 mL IVPB        500 mg 250 mL/hr over 60 Minutes Intravenous Every 24 hours 09/17/21 0622 09/22/21 0959   09/17/21 0445  ceFEPIme (MAXIPIME) 1 g in sodium chloride 0.9 % 100 mL IVPB        1 g 200 mL/hr over 30 Minutes Intravenous  Once 09/17/21 0441 09/17/21 0542   09/17/21 0445  vancomycin (VANCOREADY) IVPB 2000 mg/400 mL        2,000 mg 200 mL/hr over 120 Minutes Intravenous  Once 09/17/21 0441 09/17/21 1000     Heparin Dosing Weight: 109 kg  Assessment: 61yo F w/ h/o RV failure, HTN, GERD, PE PTA on Lovenox, iron deficiency anemia, urinary retention with Foley catheter placement, uterine bleeding, morbid obesity with BMI 78.27, who presents with shortness of  breath. Pharmacy consulted to start heparin for VTE. Pt was diagnosis in April 2023 per MD. Pt was discharge from [facility] on Enoxaparin 120 mg BID. Last anti-xa level was 0.75 (therapeutic). Pt has some hematuria so switching enoxaparin to heparin.   Note: PTA - pt received enoxaparin 120 mg x 1 5/19 '@2145'$ .   Date Time  HL Rate/Comment 5/20 1600 0.13 Subtherapeutic; 1700>2100 un/hr  5/21 0007 0.17 Subtherapeutic    Baseline Labs: aPTT - 38s INR - 1.5 Hgb - 7.7>7.3 Plts - 274>268  Goal of Therapy:  Heparin level 0.3-0.7 units/ml Monitor platelets by anticoagulation protocol: Yes   Plan:  Subtherapeutic 0.17 (goal 0.3-0.7) Bolus 3200 units; then increase heparin infusion rate to 2400 units/hr Recheck HL in 6 hrs after rate change. Continue to monitor H&H and platelets  Renda Rolls, PharmD, [facility] 09/18/2021 1:21 AM

## 2021-09-18 NOTE — Progress Notes (Signed)
Randol Kern, NP notified, charge nurse Jeanette Caprice, RN also notified of patient being a MEWS yellow. Patient reports feeling fine, resting well. No new orders at this time, will continue to monitor.   09/18/21 0435  Vitals  Temp 98 F (36.7 C)  Temp Source Oral  BP 94/60  MAP (mmHg) 70  BP Location Left Arm  BP Method Automatic  Patient Position (if appropriate) Lying  Pulse Rate (!) 103  Pulse Rate Source Dinamap  Resp 20  MEWS COLOR  MEWS Score Color Yellow  Oxygen Therapy  SpO2 95 %  O2 Device Nasal Cannula  O2 Flow Rate (L/min) 2 L/min  Pain Assessment  Pain Scale 0-10  Pain Score 0  MEWS Score  MEWS Temp 0  MEWS Systolic 1  MEWS Pulse 1  MEWS RR 0  MEWS LOC 0  MEWS Score 2  Provider Notification  Provider Name/Title Anne Ng  Date Provider Notified 09/18/21  Time Provider Notified [phone]  Method of Notification Page  Notification Reason Other (Comment) (Yellow MEWS)  Date of Provider Response 09/18/21

## 2021-09-18 NOTE — Consult Note (Signed)
[place] for Heparin Indication: pulmonary embolus  No Known Allergies  Patient Measurements: Height: '5\' 4"'$  (162.6 cm) Weight: (!) 206.8 kg (456 lb) IBW/kg (Calculated) : 54.7 Heparin Dosing Weight: 109 kg  Vital Signs: Temp: 98.2 F (36.8 C) (05/21 0835) Temp Source: Oral (05/21 0835) BP: 90/55 (05/21 0835) Pulse Rate: 110 (05/21 0835)  Labs: Recent Labs    09/15/21 1328 09/17/21 0050 09/17/21 0425 09/17/21 0606 09/17/21 1022 09/17/21 1600 09/17/21 1605 09/18/21 0007 09/18/21 0817  HGB 8.6* 7.7*  --   --   --   --  7.3* 7.5* 8.1*  HCT 30.1* 26.5*  --   --   --   --  25.0* 25.9* 27.7*  PLT 212 274  --   --   --   --  268 274 284  APTT  --   --   --   --  38*  --   --   --   --   LABPROT  --   --   --   --  18.0*  --   --   --   --   INR  --   --   --   --  1.5*  --   --   --   --   HEPARINUNFRC  --   --   --   --   --  0.13*  --  0.17* 0.33  CREATININE 0.62  --   --   --   --   --   --  0.78  --   TROPONINIHS  --  '8 8 8  '$ --   --   --   --   --      Estimated Creatinine Clearance: 138.1 mL/min (by C-G formula based on SCr of 0.78 mg/dL).   Medical History: Past Medical History:  Diagnosis Date   Anemia, unspecified    Hypertension     Medications:  Medications Prior to Admission  Medication Sig Dispense Refill Last Dose   enoxaparin (LOVENOX) 120 MG/0.8ML injection Inject 120 mg into the skin daily.   09/14/2021   ferrous sulfate 325 (65 FE) MG EC tablet Take 1 tablet (325 mg total) by mouth 3 (three) times daily with meals. 90 tablet 3 09/14/2021   furosemide (LASIX) 40 MG tablet Take 40 mg by mouth daily.   09/14/2021   magnesium oxide (MAG-OX) 400 (240 Mg) MG tablet Take 1 tablet by mouth daily.   09/14/2021   medroxyPROGESTERone (PROVERA) 10 MG tablet Take 10 mg by mouth in the morning and at bedtime.   09/14/2021   pantoprazole (PROTONIX) 40 MG tablet Take 40 mg by mouth daily.   09/14/2021   torsemide (DEMADEX) 20 MG  tablet Take 20 mg by mouth daily.   09/14/2021   vitamin B-12 (CYANOCOBALAMIN) 1000 MCG tablet Take by mouth.   09/14/2021   Scheduled:   Chlorhexidine Gluconate Cloth  6 each Topical Daily   ferrous sulfate  325 mg Oral Q breakfast   guaiFENesin  600 mg Oral BID   ipratropium-albuterol  3 mL Nebulization TID   magnesium oxide  400 mg Oral Daily   medroxyPROGESTERone  10 mg Oral BID   pantoprazole  40 mg Oral Daily   torsemide  20 mg Oral Daily   Infusions:   azithromycin 500 mg (09/17/21 0956)   heparin 2,400 Units/hr (09/18/21 0752)   magnesium sulfate bolus IVPB 2 g (09/18/21 0905)   vancomycin  1,500 mg (09/18/21 0998)   PRN: acetaminophen **OR** acetaminophen, albuterol, chlorpheniramine-HYDROcodone, hydrALAZINE, HYDROcodone-acetaminophen, magnesium hydroxide, ondansetron **OR** ondansetron (ZOFRAN) IV, traZODone Anti-infectives (From admission, onward)    Start     Dose/Rate Route Frequency Ordered Stop   09/17/21 1900  vancomycin (VANCOREADY) IVPB 1500 mg/300 mL        1,500 mg 150 mL/hr over 120 Minutes Intravenous Every 12 hours 09/17/21 1857     09/17/21 1200  cefTRIAXone (ROCEPHIN) 2 g in sodium chloride 0.9 % 100 mL IVPB  Status:  Discontinued        2 g 200 mL/hr over 30 Minutes Intravenous Every 24 hours 09/17/21 0622 09/17/21 1844   09/17/21 1000  azithromycin (ZITHROMAX) 500 mg in sodium chloride 0.9 % 250 mL IVPB        500 mg 250 mL/hr over 60 Minutes Intravenous Every 24 hours 09/17/21 0622 09/22/21 0959   09/17/21 0445  ceFEPIme (MAXIPIME) 1 g in sodium chloride 0.9 % 100 mL IVPB        1 g 200 mL/hr over 30 Minutes Intravenous  Once 09/17/21 0441 09/18/21 0743   09/17/21 0445  vancomycin (VANCOREADY) IVPB 2000 mg/400 mL        2,000 mg 200 mL/hr over 120 Minutes Intravenous  Once 09/17/21 0441 09/17/21 1000     Heparin Dosing Weight: 109 kg  Assessment: 61yo F w/ h/o RV failure, HTN, GERD, PE PTA on Lovenox, iron deficiency anemia, urinary retention with  Foley catheter placement, uterine bleeding, morbid obesity with BMI 78.27, who presents with shortness of breath. Pharmacy consulted to start heparin for VTE. Pt was diagnosis in April 2023 per MD. Pt was discharge from [facility] on Enoxaparin 120 mg BID. Last anti-xa level was 0.75 (therapeutic). Pt has some hematuria so switching enoxaparin to heparin.   Note: PTA - pt received enoxaparin 120 mg x 1 5/19 '@2145'$ .   Date Time  HL Rate/Comment 5/20 1600 0.13 Subtherapeutic; 1700>2100 un/hr  5/21 0007 0.17 Subtherapeutic  5/22 0817 0.33 therapeutic   Baseline Labs: aPTT - 38s INR - 1.5 Hgb - 7.7>7.3 Plts - 274>268  Goal of Therapy:  Heparin level 0.3-0.7 units/ml Monitor platelets by anticoagulation protocol: Yes   Plan:  Heparin level is therapeutic. Will continue heparin infusion at 2400 units/hr. Recheck heparin level in 6 hours. CBC daily while on heparin.   Eleonore Chiquito, PharmD, 09/18/2021 9:33 AM

## 2021-09-18 NOTE — Progress Notes (Signed)
  Progress Note   Patient: Krystal Francis HXT:[id number] DOB: December 21, 1960 DOA: 09/15/2021     1 DOS: the patient was seen and examined on 09/18/2021   Brief hospital course: Krystal Francis is a 61 y.o. female with medical history significant of RV failure, hypertension, GERD, PE on Lovenox, iron deficiency anemia, urinary retention with Foley catheter placement, uterine bleeding, morbid obesity with BMI 78.27, who presents with shortness of breath. Patient had a significant tachycardia, tachypnea, leukocytosis and hypoxemia with oxygen saturation 87%, she met severe sepsis criteria. Her blood culture positive for MRSA, CT angiogram showed multifocal pneumonia with evidence of pulmonary hypertension. She was seen by ID, vancomycin was started.  Assessment and Plan: Severe sepsis secondary to multifocal pneumonia. Multifocal pneumonia secondary to MRSA. MRSA septicemia. Patient meet criteria for severe sepsis as above.  Blood culture positive for MRSA.  Patient has been seen by ID, vancomycin started.  Repeat blood culture tomorrow. I personally reviewed the patient CT chest images, patient has a multifocal pneumonia.  Not typical of pulmonary septic emboli. Pneumonia may be the prior source of septicemia. Patient will need a TEE to rule out endocarditis.  Recent pulmonary emboli. Acute blood loss anemia with iron deficient anemia. Gross hematuria secondary to indwelling Foley catheter. Anticoagulation was changed to IV heparin.  It appears that patient hematuria has resolved so far.  Hemoglobin is low but relatively stable.  Patient has mild iron deficiency, will start oral iron. B12 normal. Continue heparin drip, may consider switch to Lovenox injection if hemoglobin become more stabilized.  Hypomagnesemia. We will give IV magnesium sulfate  Morbid obesity with BMI 78.27. Continue to follow, diet exercise advised  Right arm swelling. Most likely due to infiltrated IV.  Patient already on  heparin drip, if swelling get worse, will obtain ultrasound to rule out DVT      Subjective:  Complaining of right arm swelling and the left leg pain. She is still requiring 2 L oxygen, short of breath with exertion.   Physical Exam: Vitals:   09/18/21 0435 09/18/21 0636 09/18/21 0835 09/18/21 1127  BP: 94/60 94/63 (!) 90/55 109/88  Pulse: (!) 103 (!) 103 (!) 110 (!) 103  Resp: $Remo'20 18 16 17  'RDqBd$ Temp: 98 F (36.7 C) 98.3 F (36.8 C) 98.2 F (36.8 C) 98.3 F (36.8 C)  TempSrc: Oral  Oral Oral  SpO2: 95% 97% 95% 98%  Weight:      Height:       General exam: Appears calm and comfortable, morbid obesity. Respiratory system: Clear to auscultation. Respiratory effort normal. Cardiovascular system: S1 & S2 heard, RRR. No JVD, murmurs, rubs, gallops or clicks. No pedal edema. Gastrointestinal system: Abdomen is nondistended, soft and nontender. No organomegaly or masses felt. Normal bowel sounds heard. Central nervous system: Alert and oriented. No focal neurological deficits. Extremities: Right arm swelling. Skin: No rashes, lesions or ulcers Psychiatry: Judgement and insight appear normal. Mood & affect appropriate.   Data Reviewed:  Reviewed chest CT scan, all lab results.  Family Communication: sister updated  Disposition: Status is: Inpatient Remains inpatient appropriate because: Severity of disease, IV treatment.  Planned Discharge Destination: Home with Home Health    Time spent: 55 minutes  Author: Sharen Hones, MD 09/18/2021 12:43 PM  For on call review [email or web address].

## 2021-09-18 NOTE — Progress Notes (Signed)
Initial Nutrition Assessment  DOCUMENTATION CODES:   Morbid obesity  INTERVENTION:   -Liberalize diet to 2 gram sodium for wider variety of meal selections -MVI with minerals daily -Ensure Enlive po BID, each supplement provides 350 kcal and 20 grams of protein  NUTRITION DIAGNOSIS:   Increased nutrient needs related to acute illness as evidenced by estimated needs.  GOAL:   Patient will meet greater than or equal to 90% of their needs  MONITOR:   PO intake, Supplement acceptance  REASON FOR ASSESSMENT:   Malnutrition Screening Tool    ASSESSMENT:   Pt with medical history significant of RV failure, hypertension, GERD, PE on Lovenox, iron deficiency anemia, urinary retention with Foley catheter placement, uterine bleeding, morbid obesity with BMI 78.27, who presents with shortness of breath.  Pt admitted with multifocal pneumonia.   Reviewed I/O's: -1.7 L x 24 hours and -4.8 L since admission  UOP: 3.3 L x 24 hours  Pt unavailable at time of visit. Attempted to speak with pt via call to hospital room phone, however, unable to reach. RD unable to obtain further nutrition-related history or complete nutrition-focused physical exam at this time.     Pt currently on a 2 gram sodium diet. Noted meal completions 20-60%.   Reviewed wt hx;pt has experienced a 8.8% wt loss over the past 2 months, which is significant for time frame.   Medications reviewed and include demadex.   Labs reviewed.   Diet Order:   Diet Order             Diet 2 gram sodium Room service appropriate? Yes; Fluid consistency: Thin  Diet effective now                   EDUCATION NEEDS:   No education needs have been identified at this time  Skin:  Skin Assessment: Reviewed RN Assessment  Last BM:  09/15/21  Height:   Ht Readings from Last 1 Encounters:  09/17/21 '5\' 4"'$  (1.626 m)    Weight:   Wt Readings from Last 1 Encounters:  09/17/21 (!) 206.8 kg    Ideal Body Weight:   54.5 kg  BMI:  Body mass index is 78.27 kg/m.  Estimated Nutritional Needs:   Kcal:  1900-2100  Protein:  120-135 grams  Fluid:  > 1.9 L    Loistine Chance, RD, LDN, [place] Registered Dietitian II Certified Diabetes Care and Education Specialist Please refer to [facility] for RD and/or RD on-call/weekend/after hours pager

## 2021-09-19 DIAGNOSIS — J189 Pneumonia, unspecified organism: Secondary | ICD-10-CM | POA: Diagnosis not present

## 2021-09-19 DIAGNOSIS — A419 Sepsis, unspecified organism: Secondary | ICD-10-CM | POA: Diagnosis not present

## 2021-09-19 DIAGNOSIS — I2782 Chronic pulmonary embolism: Secondary | ICD-10-CM | POA: Diagnosis not present

## 2021-09-19 DIAGNOSIS — R7881 Bacteremia: Secondary | ICD-10-CM

## 2021-09-19 DIAGNOSIS — B9562 Methicillin resistant Staphylococcus aureus infection as the cause of diseases classified elsewhere: Secondary | ICD-10-CM

## 2021-09-19 LAB — BASIC METABOLIC PANEL
Anion gap: 11 (ref 5–15)
BUN: 21 mg/dL — ABNORMAL HIGH (ref 6–20)
CO2: 24 mmol/L (ref 22–32)
Calcium: 8 mg/dL — ABNORMAL LOW (ref 8.9–10.3)
Chloride: 98 mmol/L (ref 98–111)
Creatinine, Ser: 0.84 mg/dL (ref 0.44–1.00)
GFR, Estimated: >60 mL/min (ref >60)
Glucose, Bld: 110 mg/dL — ABNORMAL HIGH (ref 70–99)
Potassium: 3.8 mmol/L (ref 3.5–5.1)
Sodium: 133 mmol/L — ABNORMAL LOW (ref 135–145)

## 2021-09-19 LAB — MAGNESIUM: Magnesium: 1.8 mg/dL (ref 1.7–2.4)

## 2021-09-19 LAB — HEPARIN LEVEL (UNFRACTIONATED)
Heparin Unfractionated: 0.28 IU/mL — ABNORMAL LOW (ref 0.30–0.70)
Heparin Unfractionated: 0.26 IU/mL — ABNORMAL LOW (ref 0.30–0.70)
Heparin Unfractionated: 0.36 IU/mL (ref 0.30–0.70)

## 2021-09-19 LAB — MRSA NEXT GEN BY PCR, NASAL: MRSA by PCR Next Gen: DETECTED — AB

## 2021-09-19 LAB — CBC
HCT: 25.8 % — ABNORMAL LOW (ref 36.0–46.0)
Hemoglobin: 7.8 g/dL — ABNORMAL LOW (ref 12.0–15.0)
MCH: 21.8 pg — ABNORMAL LOW (ref 26.0–34.0)
MCHC: 30.2 g/dL (ref 30.0–36.0)
MCV: 72.3 fL — ABNORMAL LOW (ref 80.0–100.0)
Platelets: 312 10*3/uL (ref 150–400)
RBC: 3.57 MIL/uL — ABNORMAL LOW (ref 3.87–5.11)
RDW: 22 % — ABNORMAL HIGH (ref 11.5–15.5)
WBC: 24.4 10*3/uL — ABNORMAL HIGH (ref 4.0–10.5)
nRBC: 0.1 % (ref 0.0–0.2)

## 2021-09-19 LAB — VANCOMYCIN, PEAK: Vancomycin Pk: 39 ug/mL (ref 30–40)

## 2021-09-19 MED ORDER — ALBUMIN HUMAN 25 % IV SOLN
25.0000 g | Freq: Once | INTRAVENOUS | Status: AC
  Administered 2021-09-19: 25 g via INTRAVENOUS
  Filled 2021-09-19: qty 100

## 2021-09-19 MED ORDER — MELATONIN 5 MG PO TABS
5.0000 mg | ORAL_TABLET | Freq: Once | ORAL | Status: AC
  Administered 2021-09-19: 5 mg via ORAL
  Filled 2021-09-19: qty 1

## 2021-09-19 MED ORDER — ALBUMIN HUMAN 25 % IV SOLN
12.5000 g | Freq: Once | INTRAVENOUS | Status: DC
  Filled 2021-09-19: qty 50

## 2021-09-19 MED ORDER — SENNOSIDES-DOCUSATE SODIUM 8.6-50 MG PO TABS
2.0000 | ORAL_TABLET | Freq: Two times a day (BID) | ORAL | Status: DC
  Administered 2021-09-19 – 2021-10-11 (×26): 2 via ORAL
  Filled 2021-09-19 (×38): qty 2

## 2021-09-19 MED ORDER — HEPARIN BOLUS VIA INFUSION
1600.0000 [IU] | Freq: Once | INTRAVENOUS | Status: AC
  Administered 2021-09-19: 1600 [IU] via INTRAVENOUS
  Filled 2021-09-19: qty 1600

## 2021-09-19 MED ORDER — MIDODRINE HCL 5 MG PO TABS
5.0000 mg | ORAL_TABLET | Freq: Once | ORAL | Status: AC
  Administered 2021-09-20: 5 mg via ORAL
  Filled 2021-09-19: qty 1

## 2021-09-19 NOTE — Consult Note (Signed)
NAME: Krystal Francis  DOB: 06/24/1960  MRN: [id number]  Date/Time: 09/19/2021 1:36 PM  REQUESTING PROVIDER: [email or web address] Subjective:  REASON FOR CONSULT: MRSA bacteremia ? Krystal Francis is a 61 y.o. with a history of PE, post menopausal bleed. Morbid obesity, presents to the ED thru EMS for blood in the foley and not seeing urine Pt has had repeated admissions in the past 3 months.  In March 25 she presented to [facility] with uterine bleed.  Her hemoglobin was seven 5.4.  She was given blood transfusions into 4 units and transferred to [facility].  She underwent hysteroscopy and endometrial biopsy on 07/25/2021.  And was put on Provera .  The biopsy was normal.  She was discharged home only to get readmitted on 08/03/2021 with shortness of breath.  Was diagnosed with acute pulmonary embolism of the right side with right heart strain.  Started on Lovenox.  She stayed in the hospital till 09/01/2021. She also has lymphedema left worse than the right with cellulitis in the past She had a central line during that hospitalization which was removed before discharge.  After she went home she could not get out of her hospital bed and hence was finding it difficult to go to the bathroom and hence called her PCP and she inserted a Foley on 09/09/2021.  As per patient the Foley started to give trouble and there was blood in the Foley and hence she came to the hospital. Vitals in the ED on 09/15/2021 BP of 107 x 54 pulse 95, temperature 98.2, resp rate 20, .  The Foley was replaced and the urine output has improved WBC 15, Hb 8.6, platelet 212 and creatinine 0.62 Blood culture was sent and it is MRSA and I am asked to see the patient.  CT chest It shows pulmonary consolidation which is multifocal.  Past Medical History:  Diagnosis Date   Anemia, unspecified    Hypertension   Vaginal Bleed PE - Past Surgical History:  Procedure Laterality Date   DILATION AND CURETTAGE OF UTERUS      Social History   Socioeconomic History   Marital  status: Single    Spouse name: Not on file   Number of children: Not on file   Years of education: Not on file   Highest education level: Not on file  Occupational History   Not on file  Tobacco Use   Smoking status: Never   Smokeless tobacco: Never  Vaping Use   Vaping Use: Never used  Substance and Sexual Activity   Alcohol use: Never   Drug use: Never   Sexual activity: Never  Other Topics Concern   Not on file  Social History Narrative   Not on file   Social Determinants of Health   Financial Resource Strain: Not on file  Food Insecurity: Not on file  Transportation Needs: Not on file  Physical Activity: Not on file  Stress: Not on file  Social Connections: Not on file  Intimate Partner Violence: Not on file    Family History  Problem Relation Age of Onset   Hypertension Mother    Hypertension Father    Diabetes Mellitus I Father    No Known Allergies I? Current Facility-Administered Medications  Medication Dose Route Frequency Provider Last Rate Last Admin   acetaminophen (TYLENOL) tablet 650 mg  650 mg Oral Q6H PRN Mansy, Jan A, MD       Or   acetaminophen (TYLENOL) suppository 650 mg  650 mg Rectal Q6H PRN Mansy,  Jan A, MD       albuterol (PROVENTIL) (2.5 MG/3ML) 0.083% nebulizer solution 3 mL  3 mL Inhalation Q4H PRN Ivor Costa, MD       Chlorhexidine Gluconate Cloth 2 % PADS 6 each  6 each Topical Daily Mansy, Jan A, MD   6 each at 09/19/21 1044   chlorpheniramine-HYDROcodone 10-8 MG/5ML suspension 5 mL  5 mL Oral Q12H PRN Mansy, Jan A, MD       feeding supplement (ENSURE ENLIVE / ENSURE PLUS) liquid 237 mL  237 mL Oral BID BM Sharen Hones, MD   237 mL at 09/19/21 1009   guaiFENesin (MUCINEX) 12 hr tablet 600 mg  600 mg Oral BID Mansy, Jan A, MD   600 mg at 09/19/21 0959   heparin ADULT infusion 100 units/mL (25000 units/220m)  2,900 Units/hr Intravenous Continuous BLorna Dibble RPH 27.5 mL/hr at 09/19/21 0615 2,750 Units/hr at 09/19/21 0615   heparin  bolus via infusion 1,600 Units  1,600 Units Intravenous Once Beers, BErlene QuanD, RPH       hydrALAZINE (APRESOLINE) injection 5 mg  5 mg Intravenous Q2H PRN NIvor Costa MD       HYDROcodone-acetaminophen (NORCO/VICODIN) 5-325 MG per tablet 1 tablet  1 tablet Oral Q4H PRN SCarrie Mew MD   1 tablet at 09/18/21 1530   HYDROmorphone (DILAUDID) injection 0.5 mg  0.5 mg Intravenous Q4H PRN ZSharen Hones MD       ipratropium-albuterol (DUONEB) 0.5-2.5 (3) MG/3ML nebulizer solution 3 mL  3 mL Nebulization BID NIvor Costa MD   3 mL at 09/19/21 02993  iron polysaccharides (NIFEREX) capsule 150 mg  150 mg Oral Daily ZSharen Hones MD   150 mg at 09/19/21 0959   magnesium hydroxide (MILK OF MAGNESIA) suspension 30 mL  30 mL Oral Daily PRN Mansy, Jan A, MD       magnesium oxide (MAG-OX) tablet 400 mg  400 mg Oral Daily GNance Pear MD   400 mg at 09/19/21 07169  medroxyPROGESTERone (PROVERA) tablet 10 mg  10 mg Oral BID GNance Pear MD   10 mg at 09/19/21 06789  multivitamin with minerals tablet 1 tablet  1 tablet Oral Daily ZSharen Hones MD   1 tablet at 09/19/21 1001   ondansetron (ZOFRAN) tablet 4 mg  4 mg Oral Q6H PRN Mansy, Jan A, MD       Or   ondansetron ([facility] injection 4 mg  4 mg Intravenous Q6H PRN Mansy, Jan A, MD       pantoprazole (PROTONIX) EC tablet 40 mg  40 mg Oral Daily GNance Pear MD   40 mg at 09/19/21 1001   senna-docusate (Senokot-S) tablet 2 tablet  2 tablet Oral BID ZSharen Hones MD   2 tablet at 09/19/21 0959   torsemide (DEMADEX) tablet 20 mg  20 mg Oral Daily GNance Pear MD   20 mg at 09/19/21 0959   traZODone (DESYREL) tablet 25 mg  25 mg Oral QHS PRN Mansy, Jan A, MD       vancomycin (VANCOREADY) IVPB 1500 mg/300 mL  1,500 mg Intravenous Q12H BLorna Dibble RPH 150 mL/hr at 09/19/21 0648 1,500 mg at 09/19/21 03810    Abtx:  Anti-infectives (From admission, onward)    Start     Dose/Rate Route Frequency Ordered Stop   09/17/21 1900  vancomycin  (VANCOREADY) IVPB 1500 mg/300 mL        1,500 mg 150 mL/hr over 120 Minutes Intravenous Every  12 hours 09/17/21 1857     09/17/21 1200  cefTRIAXone (ROCEPHIN) 2 g in sodium chloride 0.9 % 100 mL IVPB  Status:  Discontinued        2 g 200 mL/hr over 30 Minutes Intravenous Every 24 hours 09/17/21 0622 09/17/21 1844   09/17/21 1000  azithromycin (ZITHROMAX) 500 mg in sodium chloride 0.9 % 250 mL IVPB  Status:  Discontinued        500 mg 250 mL/hr over 60 Minutes Intravenous Every 24 hours 09/17/21 0622 09/18/21 1247   09/17/21 0445  ceFEPIme (MAXIPIME) 1 g in sodium chloride 0.9 % 100 mL IVPB        1 g 200 mL/hr over 30 Minutes Intravenous  Once 09/17/21 0441 09/18/21 0743   09/17/21 0445  vancomycin (VANCOREADY) IVPB 2000 mg/400 mL        2,000 mg 200 mL/hr over 120 Minutes Intravenous  Once 09/17/21 0441 09/18/21 1437       REVIEW OF SYSTEMS:  Const: negative fever, negative chills, negative weight loss Eyes: negative diplopia or visual changes, negative eye pain ENT: negative coryza, negative sore throat Resp: negative cough, hemoptysis, has dyspnea Cards: negative for chest pain, palpitations, has lower extremity edema GU: as above GI: Negative for abdominal pain, diarrhea, bleeding, constipation Skin: negative for rash and pruritus Heme: negative for easy bruising and gum/nose bleeding MS: poor mobility Neurolo:negative for headaches, dizziness, vertigo, memory problems  Psych: negative for feelings of anxiety, depression  Endocrine: negative for thyroid, diabetes Allergy/Immunology- negative for any medication or food allergies ?  Objective:  VITALS:  BP (!) 81/55   Pulse (!) 106   Temp 98.7 F (37.1 C)   Resp 19   Ht '5\' 4"'$  (1.626 m)   Wt (!) 207.7 kg   SpO2 95%   BMI 78.62 kg/m  LDA Foley  PHYSICAL EXAM:  General: Alert, cooperative, no distress, appears stated age.  Head: Normocephalic, without obvious abnormality, atraumatic. Eyes: Conjunctivae clear,  anicteric sclerae. Pupils are equal ENT Nares normal. No drainage or sinus tenderness. Lips, mucosa, and tongue normal. No Thrush Neck: Supple, symmetrical, no adenopathy, thyroid: non tender no carotid bruit and no JVD. Back: di not examine Lungs: Clear to auscultation bilaterally. No Wheezing or Rhonchi. No rales. Heart: Regular rate and rhythm, no murmur, rub or gallop. Abdomen: Soft, non-tender,not distended. Bowel sounds normal. No masses Extremities: lower extremities-lymphedema Skin: No rashes or lesions. Or bruising Lymph: Cervical, supraclavicular normal. Neurologic: Grossly non-focal Pertinent Labs Lab Results CBC    Component Value Date/Time   WBC 24.4 (H) 09/19/2021 1230   RBC 3.57 (L) 09/19/2021 1230   HGB 7.8 (L) 09/19/2021 1230   HCT 25.8 (L) 09/19/2021 1230   PLT 312 09/19/2021 1230   MCV 72.3 (L) 09/19/2021 1230   MCH 21.8 (L) 09/19/2021 1230   MCHC 30.2 09/19/2021 1230   RDW 22.0 (H) 09/19/2021 1230   LYMPHSABS 1.6 09/17/2021 0050   MONOABS 1.2 (H) 09/17/2021 0050   EOSABS 0.1 09/17/2021 0050   BASOSABS 0.1 09/17/2021 0050       Latest Ref Rng & Units 09/19/2021    5:06 AM 09/18/2021   12:07 AM 09/15/2021    1:28 PM  CMP  Glucose 70 - 99 mg/dL 110   110   121    BUN 6 - 20 mg/dL '21   20   16    '$ Creatinine 0.44 - 1.00 mg/dL 0.84   0.78   0.62    Sodium  135 - 145 mmol/L 133   135   133    Potassium 3.5 - 5.1 mmol/L 3.8   4.1   4.5    Chloride 98 - 111 mmol/L 98   99   99    CO2 22 - 32 mmol/L '24   27   21    '$ Calcium 8.9 - 10.3 mg/dL 8.0   8.0   8.8        Microbiology: Recent Results (from the past 240 hour(s))  Blood culture (routine x 2)     Status: Abnormal (Preliminary result)   Collection Time: 09/17/21  5:12 AM   Specimen: BLOOD  Result Value Ref Range Status   Specimen Description   Final    BLOOD BLOOD LEFT WRIST Performed at [facility], [address] [place], [place] [address]    Special Requests   Final    BOTTLES  DRAWN AEROBIC AND ANAEROBIC Blood Culture adequate volume Performed at [facility], [address] [place], [place] [address]    Culture  Setup Time   Final    GRAM POSITIVE COCCI IN BOTH AEROBIC AND ANAEROBIC BOTTLES CRITICAL RESULT CALLED TO, READ BACK BY AND VERIFIED WITH: BRANDON BEERS @ 1822 09/17/21 LFD GRAM STAIN REVIEWED-AGREE WITH RESULT    Culture (A)  Final    STAPHYLOCOCCUS AUREUS CULTURE REINCUBATED FOR BETTER GROWTH Performed at [facility], [place] [address] [place], [place] [address]    Report Status PENDING  Incomplete  Blood culture (routine x 2)     Status: Abnormal (Preliminary result)   Collection Time: 09/17/21  5:12 AM   Specimen: BLOOD  Result Value Ref Range Status   Specimen Description   Final    BLOOD BLOOD LEFT FOREARM Performed at [facility], [address] [place], [place] [address]    Special Requests   Final    BOTTLES DRAWN AEROBIC AND ANAEROBIC Blood Culture results may not be optimal due to an inadequate volume of blood received in culture bottles Performed at [facility], [address] [place], [place] [address]    Culture  Setup Time   Final    Organism ID to follow [place] CRITICAL RESULT CALLED TO, READ BACK BY AND VERIFIED WITH: BRANDON BEERS '@1822'$  09/17/21 LFD GRAM STAIN REVIEWED-AGREE WITH RESULT    Culture (A)  Final    STAPHYLOCOCCUS AUREUS SUSCEPTIBILITIES TO FOLLOW Performed at [facility], [place] [address], [place], [place] [address]    Report Status PENDING  Incomplete  Blood Culture ID Panel (Reflexed)     Status: Abnormal   Collection Time: 09/17/21  5:12 AM  Result Value Ref Range Status   Enterococcus faecalis NOT DETECTED NOT DETECTED Final   Enterococcus Faecium NOT DETECTED NOT DETECTED Final   Listeria monocytogenes NOT DETECTED NOT DETECTED Final   Staphylococcus species DETECTED (A) NOT DETECTED Final    Comment:  CRITICAL RESULT CALLED TO, READ BACK BY AND VERIFIED WITH: BRANDON BEERS ON 09/17/21 AT 1822 LFD    Staphylococcus aureus (BCID) DETECTED (A) NOT DETECTED Final    Comment: Methicillin (oxacillin)-resistant Staphylococcus aureus (MRSA). MRSA is predictably resistant to beta-lactam antibiotics (except ceftaroline). Preferred therapy is vancomycin unless clinically contraindicated. Patient requires contact precautions if  hospitalized. CRITICAL RESULT CALLED TO, READ BACK BY AND VERIFIED WITH: BRANDON BEERS @ 4270 09/17/21 LFD    Staphylococcus epidermidis NOT DETECTED NOT DETECTED Final   Staphylococcus  lugdunensis NOT DETECTED NOT DETECTED Final   Streptococcus species NOT DETECTED NOT DETECTED Final   Streptococcus agalactiae NOT DETECTED NOT DETECTED Final   Streptococcus pneumoniae NOT DETECTED NOT DETECTED Final   Streptococcus pyogenes NOT DETECTED NOT DETECTED Final   A.calcoaceticus-baumannii NOT DETECTED NOT DETECTED Final   Bacteroides fragilis NOT DETECTED NOT DETECTED Final   Enterobacterales NOT DETECTED NOT DETECTED Final   Enterobacter cloacae complex NOT DETECTED NOT DETECTED Final   Escherichia coli NOT DETECTED NOT DETECTED Final   Klebsiella aerogenes NOT DETECTED NOT DETECTED Final   Klebsiella oxytoca NOT DETECTED NOT DETECTED Final   Klebsiella pneumoniae NOT DETECTED NOT DETECTED Final   Proteus species NOT DETECTED NOT DETECTED Final   Salmonella species NOT DETECTED NOT DETECTED Final   Serratia marcescens NOT DETECTED NOT DETECTED Final   Haemophilus influenzae NOT DETECTED NOT DETECTED Final   Neisseria meningitidis NOT DETECTED NOT DETECTED Final   Pseudomonas aeruginosa NOT DETECTED NOT DETECTED Final   Stenotrophomonas maltophilia NOT DETECTED NOT DETECTED Final   Candida albicans NOT DETECTED NOT DETECTED Final   Candida auris NOT DETECTED NOT DETECTED Final   Candida glabrata NOT DETECTED NOT DETECTED Final   Candida krusei NOT DETECTED NOT DETECTED  Final   Candida parapsilosis NOT DETECTED NOT DETECTED Final   Candida tropicalis NOT DETECTED NOT DETECTED Final   Cryptococcus neoformans/gattii NOT DETECTED NOT DETECTED Final   Meth resistant mecA/C and MREJ DETECTED (A) NOT DETECTED Final    Comment: CRITICAL RESULT CALLED TO, READ BACK BY AND VERIFIED WITH: BRANDON BEERS @ 1822 09/17/21 LFD Performed at [facility], [address] [place], [place] [address]   Expectorated Sputum Assessment w Gram Stain, Rflx to Resp Cult     Status: None   Collection Time: 09/17/21  8:57 AM   Specimen: Expectorated Sputum  Result Value Ref Range Status   Specimen Description EXPECTORATED SPUTUM  Final   Special Requests NONE  Final   Sputum evaluation   Final    THIS SPECIMEN IS ACCEPTABLE FOR SPUTUM CULTURE Performed at [facility], [address] [place], [place] [address]    Report Status 09/17/2021 FINAL  Final  Resp Panel by RT-PCR (Flu A&B, Covid) Nasopharyngeal Swab     Status: None   Collection Time: 09/17/21  8:57 AM   Specimen: Nasopharyngeal Swab; Nasopharyngeal(NP) swabs in vial transport medium  Result Value Ref Range Status   SARS Coronavirus 2 by RT PCR NEGATIVE NEGATIVE Final    Comment: (NOTE) SARS-CoV-2 target nucleic acids are NOT DETECTED.  The SARS-CoV-2 RNA is generally detectable in upper respiratory specimens during the acute phase of infection. The lowest concentration of SARS-CoV-2 viral copies this assay can detect is 138 copies/mL. A negative result does not preclude SARS-Cov-2 infection and should not be used as the sole basis for treatment or other patient management decisions. A negative result may occur with  improper specimen collection/handling, submission of specimen other than nasopharyngeal swab, presence of viral mutation(s) within the areas targeted by this assay, and inadequate number of viral copies(<138 copies/mL). A negative result must be combined with clinical  observations, patient history, and epidemiological information. The expected result is Negative.  Fact Sheet for Patients:  EntrepreneurPulse.com.au  Fact Sheet for Healthcare Providers:  IncredibleEmployment.be  This test is no t yet approved or cleared by the [place] FDA and  has been authorized for detection and/or diagnosis of SARS-CoV-2 by FDA under an Emergency Use Authorization (EUA). This EUA will  remain  in effect (meaning this test can be used) for the duration of the COVID-19 declaration under Section 564(b)(1) of the Act, 21 U.S.C.section 360bbb-3(b)(1), unless the authorization is terminated  or revoked sooner.       Influenza A by PCR NEGATIVE NEGATIVE Final   Influenza B by PCR NEGATIVE NEGATIVE Final    Comment: (NOTE) The Xpert Xpress SARS-CoV-2/FLU/RSV plus assay is intended as an aid in the diagnosis of influenza from Nasopharyngeal swab specimens and should not be used as a sole basis for treatment. Nasal washings and aspirates are unacceptable for Xpert Xpress SARS-CoV-2/FLU/RSV testing.  Fact Sheet for Patients: EntrepreneurPulse.com.au  Fact Sheet for Healthcare Providers: IncredibleEmployment.be  This test is not yet approved or cleared by the [place] FDA and has been authorized for detection and/or diagnosis of SARS-CoV-2 by FDA under an Emergency Use Authorization (EUA). This EUA will remain in effect (meaning this test can be used) for the duration of the COVID-19 declaration under Section 564(b)(1) of the Act, 21 U.S.C. section 360bbb-3(b)(1), unless the authorization is terminated or revoked.  Performed at [facility], [address] [place], [place] [address]   Culture, Respiratory w Gram Stain     Status: None (Preliminary result)   Collection Time: 09/17/21  8:57 AM  Result Value Ref Range Status   Specimen Description   Final    EXPECTORATED  SPUTUM Performed at [facility], [address], [place], [place] [address]    Special Requests   Final    NONE Reflexed from [id number] Performed at [facility], [place], [place] [address]    Gram Stain   Final    RARE NO ORGANISMS SEEN RARE GRAM POSITIVE COCCI IN PAIRS RARE GRAM VARIABLE ROD    Culture   Final    CULTURE REINCUBATED FOR BETTER GROWTH Performed at [facility], [place] [address], [place], [place] [address]    Report Status PENDING  Incomplete  Culture, blood (Routine X 2) w Reflex to ID Panel     Status: None (Preliminary result)   Collection Time: 09/19/21  5:06 AM   Specimen: BLOOD  Result Value Ref Range Status   Specimen Description BLOOD RIGHT HAND  Final   Special Requests   Final    BOTTLES DRAWN AEROBIC AND ANAEROBIC Blood Culture results may not be optimal due to an inadequate volume of blood received in culture bottles   Culture   Final    NO GROWTH <12 HOURS Performed at [facility], [address], [place], [place] [address]    Report Status PENDING  Incomplete    IMAGING RESULTS:  I have personally reviewed the films ?b/lpulmonary infiltrate  Impression/Recommendation MRSA bacteremia Pt was in [facility] for 4 weeks and had central line until 5/4- with b/l pulmonary infiltrates and MRSA nares need to r/o MRSA pneumonia VS septic emboli to the lungs due to MRSA bacteremia Need to r/o infective endocarditis of tricuspid valve? Agree with vanco- duration depends on the TEE Needs 2d echo and if unrevealing will need TEE Repeat Blood culture  Anemia- recent vaginal bleed- needing multiple units of PRBC in March 2023 Recent PE with rt heart strain was on lovenox ? ?Has foley for convenience- would recommend purewick  Lymphedema legs left worse than right. ___________________________________________________ Discussed with patient, and requesting provider Note:  This document was prepared using Dragon  voice recognition software and may include unintentional dictation errors.

## 2021-09-19 NOTE — Consult Note (Signed)
ANTICOAGULATION CONSULT NOTE   Pharmacy Consult for Heparin Indication: pulmonary embolus  No Known Allergies  Patient Measurements: Height: '5\' 4"'$  (162.6 cm) Weight: (!) 206.8 kg (456 lb) IBW/kg (Calculated) : 54.7 Heparin Dosing Weight: 109 kg  Vital Signs: Temp: 98.2 F (36.8 C) (05/22 0516) BP: 94/50 (05/22 0516) Pulse Rate: 109 (05/22 0516)  Labs: Recent Labs    09/17/21 0050 09/17/21 0425 09/17/21 0606 09/17/21 1022 09/17/21 1600 09/17/21 1605 09/18/21 0007 09/18/21 0817 09/18/21 1448 09/18/21 2156 09/19/21 0506  HGB 7.7*  --   --   --   --  7.3* 7.5* 8.1*  --   --   --   HCT 26.5*  --   --   --   --  25.0* 25.9* 27.7*  --   --   --   PLT 274  --   --   --   --  268 274 284  --   --   --   APTT  --   --   --  38*  --   --   --   --   --   --   --   LABPROT  --   --   --  18.0*  --   --   --   --   --   --   --   INR  --   --   --  1.5*  --   --   --   --   --   --   --   HEPARINUNFRC  --   --   --   --    < >  --  0.17* 0.33 0.26* 0.32 0.28*  CREATININE  --   --   --   --   --   --  0.78  --   --   --  0.84  TROPONINIHS '8 8 8  '$ --   --   --   --   --   --   --   --    < > = values in this interval not displayed.     Estimated Creatinine Clearance: 131.5 mL/min (by C-G formula based on SCr of 0.84 mg/dL).   Medical History: Past Medical History:  Diagnosis Date   Anemia, unspecified    Hypertension     Medications:  Medications Prior to Admission  Medication Sig Dispense Refill Last Dose   enoxaparin (LOVENOX) 120 MG/0.8ML injection Inject 120 mg into the skin daily.   09/14/2021   ferrous sulfate 325 (65 FE) MG EC tablet Take 1 tablet (325 mg total) by mouth 3 (three) times daily with meals. 90 tablet 3 09/14/2021   furosemide (LASIX) 40 MG tablet Take 40 mg by mouth daily.   09/14/2021   magnesium oxide (MAG-OX) 400 (240 Mg) MG tablet Take 1 tablet by mouth daily.   09/14/2021   medroxyPROGESTERone (PROVERA) 10 MG tablet Take 10 mg by mouth in the  morning and at bedtime.   09/14/2021   pantoprazole (PROTONIX) 40 MG tablet Take 40 mg by mouth daily.   09/14/2021   torsemide (DEMADEX) 20 MG tablet Take 20 mg by mouth daily.   09/14/2021   vitamin B-12 (CYANOCOBALAMIN) 1000 MCG tablet Take by mouth.   09/14/2021   Scheduled:   Chlorhexidine Gluconate Cloth  6 each Topical Daily   feeding supplement  237 mL Oral BID BM   guaiFENesin  600 mg Oral BID   ipratropium-albuterol  3 mL Nebulization BID   iron polysaccharides  150 mg Oral Daily   magnesium oxide  400 mg Oral Daily   medroxyPROGESTERone  10 mg Oral BID   multivitamin with minerals  1 tablet Oral Daily   pantoprazole  40 mg Oral Daily   torsemide  20 mg Oral Daily   Infusions:   heparin 2,600 Units/hr (09/19/21 0454)   vancomycin Stopped (09/18/21 2039)   PRN: acetaminophen **OR** acetaminophen, albuterol, chlorpheniramine-HYDROcodone, hydrALAZINE, HYDROcodone-acetaminophen, HYDROmorphone (DILAUDID) injection, magnesium hydroxide, ondansetron **OR** ondansetron (ZOFRAN) IV, traZODone Anti-infectives (From admission, onward)    Start     Dose/Rate Route Frequency Ordered Stop   09/17/21 1900  vancomycin (VANCOREADY) IVPB 1500 mg/300 mL        1,500 mg 150 mL/hr over 120 Minutes Intravenous Every 12 hours 09/17/21 1857     09/17/21 1200  cefTRIAXone (ROCEPHIN) 2 g in sodium chloride 0.9 % 100 mL IVPB  Status:  Discontinued        2 g 200 mL/hr over 30 Minutes Intravenous Every 24 hours 09/17/21 0622 09/17/21 1844   09/17/21 1000  azithromycin (ZITHROMAX) 500 mg in sodium chloride 0.9 % 250 mL IVPB  Status:  Discontinued        500 mg 250 mL/hr over 60 Minutes Intravenous Every 24 hours 09/17/21 0622 09/18/21 1247   09/17/21 0445  ceFEPIme (MAXIPIME) 1 g in sodium chloride 0.9 % 100 mL IVPB        1 g 200 mL/hr over 30 Minutes Intravenous  Once 09/17/21 0441 09/18/21 0743   09/17/21 0445  vancomycin (VANCOREADY) IVPB 2000 mg/400 mL        2,000 mg 200 mL/hr over 120 Minutes  Intravenous  Once 09/17/21 0441 09/18/21 1437     Heparin Dosing Weight: 109 kg  Assessment: 61yo F w/ h/o RV failure, HTN, GERD, PE PTA on Lovenox, iron deficiency anemia, urinary retention with Foley catheter placement, uterine bleeding, morbid obesity with BMI 78.27, who presents with shortness of breath. Pharmacy consulted to start heparin for VTE. Pt was diagnosis in April 2023 per MD. Pt was discharge from [facility] on Enoxaparin 120 mg BID. Last anti-xa level was 0.75 (therapeutic). Pt has some hematuria so switching enoxaparin to heparin.   Note: PTA - pt received enoxaparin 120 mg x 1 5/19 '@2145'$ .   Date Time  HL Rate/Comment 5/20 1600 0.13 Subtherapeutic; 1700>2100 un/hr  5/21 0007 0.17 Subtherapeutic; 2100>2400 un/hr  5/21 0817 0.33 Therapeutic x1; 2400 un/hr 5/21 1448 0.26 Subtherapeutic; 2400 > 2600 un/hr 5/21 2156 0.32 Therapeutic x 1 5/22 0506 0.28 Subtherapeutic   Baseline Labs: aPTT - 38s INR - 1.5 Hgb - 77.3>8.1 Plts - 268>284  Goal of Therapy:  Heparin level 0.3-0.7 units/ml Monitor platelets by anticoagulation protocol: Yes   Plan:  Increase heparin infusion rate to 2750 units/hr.  Recheck HL in 6 hr to confirm; then daily once consecutively therapeutic. CTM CBC daily while on heparin.   Renda Rolls, PharmD, [facility] 09/19/2021 5:46 AM

## 2021-09-19 NOTE — Progress Notes (Addendum)
Progress Note   Patient: Krystal Francis EXN:[id number] DOB: Sep 07, 1960 DOA: 09/15/2021     2 DOS: the patient was seen and examined on 09/19/2021   Brief hospital course:  Krystal Francis is a 61 y.o. female with medical history significant of RV failure, hypertension, GERD, PE on Lovenox, iron deficiency anemia, urinary retention with Foley catheter placement, uterine bleeding, morbid obesity with BMI 78.27, who presents with shortness of breath. Patient had a significant tachycardia, tachypnea, leukocytosis and hypoxemia with oxygen saturation 87%, she met severe sepsis criteria. Her blood culture positive for MRSA, CT angiogram showed multifocal pneumonia with evidence of pulmonary hypertension. She was seen by ID, vancomycin was started.   Assessment and Plan: Severe sepsis secondary to multifocal pneumonia. Multifocal pneumonia secondary to MRSA. MRSA septicemia. Acute hypoxemic respiratory failure secondary to pneumonia. Ruled in, [place] Patient is more hemodynamically stable today.  Blood culture positive for MRSA.  Repeat culture was drawn yesterday, so far has no growth. Patient also had a significant hypoxemia at time of admission, this appears to be secondary to pneumonia.  Oxygenation is better now. MRSA septicemia could be secondary to pneumonia.  Continue vancomycin.  Patient be also evaluated by ID. Wait for decision of the TEE per recommendation from ID.   Recent pulmonary emboli. Acute blood loss anemia with iron deficient anemia. Gross hematuria secondary to indwelling Foley catheter. Continue anticoagulation with heparin drip.  Hematuria seem to be improved.  Hemoglobin is low but stable.  We will continue oral iron supplement.  B12 level normal.   Hypomagnesemia. Hyponatremia Patient has a mild hyponatremia.  Magnesium level has normalized.  Morbid obesity with BMI 78.27. Chronic right-sided congestive heart failure. Hypotension. Patient blood pressure running low, this is  partially due to right-sided congestive heart failure.  We will give 25 g albumin, discontinue torsemide.  Left leg severe peripheral arterial disease. Patient has leg pain at rest, reviewed duplex ultrasound, significant peripheral vascular disease.  Will obtain vascular surgery consult, but could not perform any intervention due to active infection with bacteremia.  Heparin drip for now.   Right arm swelling. Continue to follow.  Patient and family has requested transfer patient to [facility].  The main reason for transfer is that patient gets all her care in [facility], she also does not feel comfortable with our hospital bed which is too small for her size.  Currently she is in  the largest bed in our hospital.   I have called [facility] transfer center, patient case will be reviewed by them to decide if they can accept the patient.  But currently do not have a bed.  They will call us back if patient is accepted and a bed is available.  1220: [place] transfer line called me back, their medical director has reviewed the case.  At this point, they could not accept patient due to the fact that patient does not have needs for next level of care.     Subjective:  Patient still complaining left leg pain, which is controlled with IV pain medicine. Denies any short of breath.  Physical Exam: Vitals:   09/19/21 0516 09/19/21 0917 09/19/21 1200 09/19/21 1217  BP: (!) 94/50 (!) 82/50 (!) 93/44   Pulse: (!) 109 (!) 109 (!) 111 (!) 108  Resp: _0 Temp: 98.2 F (36.8 C) 97.8 F (36.6 C) 98.8 F (37.1 C)   TempSrc:  Oral Oral   SpO2: 92% 95% 93%   Weight:  Height:       General exam: Appears calm and comfortable, morbid obese Respiratory system: Decreased breathing sounds. Respiratory effort normal. Cardiovascular system: S1 & S2 heard, RRR. No JVD, murmurs, rubs, gallops or clicks.  Gastrointestinal system: Abdomen is nondistended, soft and nontender. No  organomegaly or masses felt. Normal bowel sounds heard. Central nervous system: Alert and oriented. No focal neurological deficits. Extremities: Right arm swelling, Skin: No rashes, lesions or ulcers Psychiatry: Judgement and insight appear normal. Mood & affect appropriate.   Data Reviewed:  Reviewed arterial duplex ultrasound. Reviewed all lab results.  Family Communication: Sister updated.  Disposition: Status is: Inpatient Remains inpatient appropriate because: Severity of disease, IV treatment.  Planned Discharge Destination: Skilled nursing facility    Time spent: 55 minutes  Author: Sharen Hones, MD 09/19/2021 12:53 PM  For on call review [email or web address].

## 2021-09-19 NOTE — Consult Note (Signed)
ANTICOAGULATION CONSULT NOTE   Pharmacy Consult for Heparin Indication: pulmonary embolus  No Known Allergies  Patient Measurements: Height: '5\' 4"'$  (162.6 cm) Weight: (!) 207.7 kg (458 lb) IBW/kg (Calculated) : 54.7 Heparin Dosing Weight: 109 kg  Vital Signs: Temp: 98.8 F (37.1 C) (05/22 2200) Temp Source: Oral (05/22 1613) BP: 88/40 (05/22 2200) Pulse Rate: 75 (05/22 2200)  Labs: Recent Labs    09/17/21 0050 09/17/21 0425 09/17/21 0606 09/17/21 1022 09/17/21 1600 09/18/21 0007 09/18/21 0817 09/18/21 1448 09/19/21 0506 09/19/21 1230 09/19/21 2141  HGB 7.7*  --   --   --    < > 7.5* 8.1*  --   --  7.8*  --   HCT 26.5*  --   --   --    < > 25.9* 27.7*  --   --  25.8*  --   PLT 274  --   --   --    < > 274 284  --   --  312  --   APTT  --   --   --  38*  --   --   --   --   --   --   --   LABPROT  --   --   --  18.0*  --   --   --   --   --   --   --   INR  --   --   --  1.5*  --   --   --   --   --   --   --   HEPARINUNFRC  --   --   --   --    < > 0.17* 0.33   < > 0.28* 0.26* 0.36  CREATININE  --   --   --   --   --  0.78  --   --  0.84  --   --   TROPONINIHS '8 8 8  '$ --   --   --   --   --   --   --   --    < > = values in this interval not displayed.     Estimated Creatinine Clearance: 131.9 mL/min (by C-G formula based on SCr of 0.84 mg/dL).   Medical History: Past Medical History:  Diagnosis Date   Anemia, unspecified    Hypertension     Medications:  Medications Prior to Admission  Medication Sig Dispense Refill Last Dose   enoxaparin (LOVENOX) 120 MG/0.8ML injection Inject 120 mg into the skin daily.   09/14/2021   ferrous sulfate 325 (65 FE) MG EC tablet Take 1 tablet (325 mg total) by mouth 3 (three) times daily with meals. 90 tablet 3 09/14/2021   furosemide (LASIX) 40 MG tablet Take 40 mg by mouth daily.   09/14/2021   magnesium oxide (MAG-OX) 400 (240 Mg) MG tablet Take 1 tablet by mouth daily.   09/14/2021   medroxyPROGESTERone (PROVERA) 10 MG  tablet Take 10 mg by mouth in the morning and at bedtime.   09/14/2021   pantoprazole (PROTONIX) 40 MG tablet Take 40 mg by mouth daily.   09/14/2021   torsemide (DEMADEX) 20 MG tablet Take 20 mg by mouth daily.   09/14/2021   vitamin B-12 (CYANOCOBALAMIN) 1000 MCG tablet Take by mouth.   09/14/2021   Scheduled:   Chlorhexidine Gluconate Cloth  6 each Topical Daily   feeding supplement  237 mL Oral BID BM   guaiFENesin  600 mg Oral BID   ipratropium-albuterol  3 mL Nebulization BID   iron polysaccharides  150 mg Oral Daily   magnesium oxide  400 mg Oral Daily   medroxyPROGESTERone  10 mg Oral BID   [START ON 09/20/2021] melatonin  3 mg Oral Once   multivitamin with minerals  1 tablet Oral Daily   pantoprazole  40 mg Oral Daily   senna-docusate  2 tablet Oral BID   Infusions:   albumin human     heparin 2,900 Units/hr (09/19/21 1358)   vancomycin 1,500 mg (09/19/21 1755)   PRN: acetaminophen **OR** acetaminophen, albuterol, chlorpheniramine-HYDROcodone, hydrALAZINE, HYDROcodone-acetaminophen, HYDROmorphone (DILAUDID) injection, magnesium hydroxide, ondansetron **OR** ondansetron (ZOFRAN) IV, traZODone Anti-infectives (From admission, onward)    Start     Dose/Rate Route Frequency Ordered Stop   09/17/21 1900  vancomycin (VANCOREADY) IVPB 1500 mg/300 mL        1,500 mg 150 mL/hr over 120 Minutes Intravenous Every 12 hours 09/17/21 1857     09/17/21 1200  cefTRIAXone (ROCEPHIN) 2 g in sodium chloride 0.9 % 100 mL IVPB  Status:  Discontinued        2 g 200 mL/hr over 30 Minutes Intravenous Every 24 hours 09/17/21 0622 09/17/21 1844   09/17/21 1000  azithromycin (ZITHROMAX) 500 mg in sodium chloride 0.9 % 250 mL IVPB  Status:  Discontinued        500 mg 250 mL/hr over 60 Minutes Intravenous Every 24 hours 09/17/21 0622 09/18/21 1247   09/17/21 0445  ceFEPIme (MAXIPIME) 1 g in sodium chloride 0.9 % 100 mL IVPB        1 g 200 mL/hr over 30 Minutes Intravenous  Once 09/17/21 0441 09/18/21  0743   09/17/21 0445  vancomycin (VANCOREADY) IVPB 2000 mg/400 mL        2,000 mg 200 mL/hr over 120 Minutes Intravenous  Once 09/17/21 0441 09/18/21 1437     Heparin Dosing Weight: 109 kg  Assessment: 61yo F w/ h/o RV failure, HTN, GERD, PE PTA on Lovenox, iron deficiency anemia, urinary retention with Foley catheter placement, uterine bleeding, morbid obesity with BMI 78.27, who presents with shortness of breath. Pharmacy consulted to start heparin for VTE. Pt was diagnosis in April 2023 per MD. Pt was discharge from [facility] on Enoxaparin 120 mg BID. Last anti-xa level was 0.75 (therapeutic). Pt has some hematuria so switching enoxaparin to heparin.   Note: PTA - pt received enoxaparin 120 mg x 1 5/19 '@2145'$ .   Date Time  HL Rate/Comment 5/20 1600 0.13 Subtherapeutic; 1700>2100 un/hr  5/21 0007 0.17 Subtherapeutic; 2100>2400 un/hr  5/21 0817 0.33 Therapeutic x1; 2400 un/hr 5/21 1448 0.26 Subtherapeutic; 2400 > 2600 un/hr 5/21 2156 0.32 Therapeutic x 1 5/22 0506 0.28 Subtherapeutic; 2600>2750 un/hr 5/22 1230 0.26 Subtherapeutic; 2750>2900 un/hr 5/22     2141    0.36    Therapeutic X 1    Baseline Labs: aPTT - 38s INR - 1.5 Hgb -8.1>7.8 Plts - [phone]  Goal of Therapy:  Heparin level 0.3-0.7 units/ml Monitor platelets by anticoagulation protocol: Yes   Plan:  5/22:  HL @ 2141 = 0.36, therapeutic X 1 Will continue pt on current rate and draw confirmation level in 6 hrs.  Dolton Shaker D Clinical Pharmacist 09/19/2021 10:37 PM

## 2021-09-19 NOTE — Consult Note (Signed)
[facility] Consult Note  MRN : [id number]  Krystal Francis is a 61 y.o. ([birth year]) female who presents with chief complaint of  Chief Complaint  Patient presents with   Hematuria  .   Consulting Physician: Reason for consult: History of Present Illness: ***  Current Facility-Administered Medications  Medication Dose Route Frequency Provider Last Rate Last Admin   acetaminophen (TYLENOL) tablet 650 mg  650 mg Oral Q6H PRN Mansy, Jan A, MD       Or   acetaminophen (TYLENOL) suppository 650 mg  650 mg Rectal Q6H PRN Mansy, Jan A, MD       albuterol (PROVENTIL) (2.5 MG/3ML) 0.083% nebulizer solution 3 mL  3 mL Inhalation Q4H PRN Ivor Costa, MD       Chlorhexidine Gluconate Cloth 2 % PADS 6 each  6 each Topical Daily Mansy, Jan A, MD   6 each at 09/19/21 1044   chlorpheniramine-HYDROcodone 10-8 MG/5ML suspension 5 mL  5 mL Oral Q12H PRN Mansy, Jan A, MD       feeding supplement (ENSURE ENLIVE / ENSURE PLUS) liquid 237 mL  237 mL Oral BID BM Sharen Hones, MD   237 mL at 09/19/21 1457   guaiFENesin (MUCINEX) 12 hr tablet 600 mg  600 mg Oral BID Mansy, Jan A, MD   600 mg at 09/19/21 0959   heparin ADULT infusion 100 units/mL (25000 units/24m)  2,900 Units/hr Intravenous Continuous BLorna Dibble RPH 29 mL/hr at 09/19/21 1358 2,900 Units/hr at 09/19/21 1358   hydrALAZINE (APRESOLINE) injection 5 mg  5 mg Intravenous Q2H PRN NIvor Costa MD       HYDROcodone-acetaminophen (NORCO/VICODIN) 5-325 MG per tablet 1 tablet  1 tablet Oral Q4H PRN SCarrie Mew MD   1 tablet at 09/18/21 1530   HYDROmorphone (DILAUDID) injection 0.5 mg  0.5 mg Intravenous Q4H PRN ZSharen Hones MD       ipratropium-albuterol (DUONEB) 0.5-2.5 (3) MG/3ML nebulizer solution 3 mL  3 mL Nebulization BID NIvor Costa MD   3 mL at 09/19/21 0803   iron polysaccharides (NIFEREX) capsule 150 mg  150 mg Oral Daily ZSharen Hones MD   150 mg at 09/19/21 0959   magnesium hydroxide (MILK OF  MAGNESIA) suspension 30 mL  30 mL Oral Daily PRN Mansy, Jan A, MD       magnesium oxide (MAG-OX) tablet 400 mg  400 mg Oral Daily GNance Pear MD   400 mg at 09/19/21 01884  medroxyPROGESTERone (PROVERA) tablet 10 mg  10 mg Oral BID GNance Pear MD   10 mg at 09/19/21 01660  multivitamin with minerals tablet 1 tablet  1 tablet Oral Daily ZSharen Hones MD   1 tablet at 09/19/21 1001   ondansetron (ZOFRAN) tablet 4 mg  4 mg Oral Q6H PRN Mansy, Jan A, MD       Or   ondansetron ([facility] injection 4 mg  4 mg Intravenous Q6H PRN Mansy, Jan A, MD       pantoprazole (PROTONIX) EC tablet 40 mg  40 mg Oral Daily GNance Pear MD   40 mg at 09/19/21 1001   senna-docusate (Senokot-S) tablet 2 tablet  2 tablet Oral BID ZSharen Hones MD   2 tablet at 09/19/21 0959   traZODone (DESYREL) tablet 25 mg  25 mg Oral QHS PRN Mansy, Jan A, MD       vancomycin (VANCOREADY) IVPB 1500 mg/300 mL  1,500 mg Intravenous Q12H BLorna Dibble RPH 150  mL/hr at 09/19/21 0648 1,500 mg at 09/19/21 8469    Past Medical History:  Diagnosis Date   Anemia, unspecified    Hypertension     Past Surgical History:  Procedure Laterality Date   DILATION AND CURETTAGE OF UTERUS      Social History Social History   Tobacco Use   Smoking status: Never   Smokeless tobacco: Never  Vaping Use   Vaping Use: Never used  Substance Use Topics   Alcohol use: Never   Drug use: Never    Family History Family History  Problem Relation Age of Onset   Hypertension Mother    Hypertension Father    Diabetes Mellitus I Father     No Known Allergies   REVIEW OF SYSTEMS (Negative unless checked)  Constitutional: '[]'$ Weight loss  '[]'$ Fever  '[]'$ Chills Cardiac: '[]'$ Chest pain   '[]'$ Chest pressure   '[]'$ Palpitations   '[]'$ Shortness of breath when laying flat   '[]'$ Shortness of breath at rest   '[]'$ Shortness of breath with exertion. Vascular:  '[]'$ Pain in legs with walking   '[]'$ Pain in legs at rest   '[]'$ Pain in legs when laying flat    '[]'$ Claudication   '[]'$ Pain in feet when walking  '[]'$ Pain in feet at rest  '[]'$ Pain in feet when laying flat   '[]'$ History of DVT   '[]'$ Phlebitis   '[]'$ Swelling in legs   '[]'$ Varicose veins   '[]'$ Non-healing ulcers Pulmonary:   '[]'$ Uses home oxygen   '[]'$ Productive cough   '[]'$ Hemoptysis   '[]'$ Wheeze  '[]'$ COPD   '[]'$ Asthma Neurologic:  '[]'$ Dizziness  '[]'$ Blackouts   '[]'$ Seizures   '[]'$ History of stroke   '[]'$ History of TIA  '[]'$ Aphasia   '[]'$ Temporary blindness   '[]'$ Dysphagia   '[]'$ Weakness or numbness in arms   '[]'$ Weakness or numbness in legs Musculoskeletal:  '[]'$ Arthritis   '[]'$ Joint swelling   '[]'$ Joint pain   '[]'$ Low back pain Hematologic:  '[]'$ Easy bruising  '[]'$ Easy bleeding   '[]'$ Hypercoagulable state   '[]'$ Anemic  '[]'$ Hepatitis Gastrointestinal:  '[]'$ Blood in stool   '[]'$ Vomiting blood  '[]'$ Gastroesophageal reflux/heartburn   '[]'$ Difficulty swallowing. Genitourinary:  '[]'$ Chronic kidney disease   '[]'$ Difficult urination  '[]'$ Frequent urination  '[]'$ Burning with urination   '[]'$ Blood in urine Skin:  '[]'$ Rashes   '[]'$ Ulcers   '[]'$ Wounds Psychological:  '[]'$ History of anxiety   '[]'$  History of major depression.  Physical Examination  Vitals:   09/19/21 1319 09/19/21 1336 09/19/21 1516 09/19/21 1613  BP: (!) 81/55 (!) 82/40 (!) 97/42 (!) 94/51  Pulse: (!) 106 (!) 110 (!) 110 (!) 105  Resp: '19  16 17  '$ Temp: 98.7 F (37.1 C)  98 F (36.7 C) 98.7 F (37.1 C)  TempSrc:   Oral Oral  SpO2: 95% 93% 94% 93%  Weight:      Height:       Body mass index is 78.62 kg/m. Gen:  WD/WN, NAD Head: [place]/AT, No temporalis wasting. Prominent temp pulse not noted. Ear/Nose/Throat: Hearing grossly intact, nares w/o erythema or drainage, oropharynx w/o Erythema/Exudate Eyes: Sclera non-icteric, conjunctiva clear Neck: Trachea midline.  No JVD.  Pulmonary:  Good air movement, respirations not labored, equal bilaterally.  Cardiac: RRR, normal S1, S2. Vascular: *** Vessel Right Left  Radial Palpable Palpable  Ulnar Palpable Palpable  Brachial Palpable Palpable  Carotid Palpable, without  bruit Palpable, without bruit  Aorta Not palpable N/A  Femoral Palpable Palpable  Popliteal Palpable Palpable  PT Palpable Palpable  DP Palpable Palpable   Gastrointestinal: soft, non-tender/non-distended. No guarding/reflex.  Musculoskeletal: M/S 5/5 throughout.  Extremities without ischemic changes.  No deformity or atrophy. No edema. Neurologic: Sensation grossly intact in extremities.  Symmetrical.  Speech is fluent. Motor exam as listed above. Psychiatric: Judgment intact, Mood & affect appropriate for pt's clinical situation. Dermatologic: No rashes or ulcers noted.  No cellulitis or open wounds. Lymph : No Cervical, Axillary, or Inguinal lymphadenopathy.    CBC Lab Results  Component Value Date   WBC 24.4 (H) 09/19/2021   HGB 7.8 (L) 09/19/2021   HCT 25.8 (L) 09/19/2021   MCV 72.3 (L) 09/19/2021   PLT 312 09/19/2021    BMET    Component Value Date/Time   NA 133 (L) 09/19/2021 0506   K 3.8 09/19/2021 0506   CL 98 09/19/2021 0506   CO2 24 09/19/2021 0506   GLUCOSE 110 (H) 09/19/2021 0506   BUN 21 (H) 09/19/2021 0506   CREATININE 0.84 09/19/2021 0506   CALCIUM 8.0 (L) 09/19/2021 0506   GFRNONAA >60 09/19/2021 0506   Estimated Creatinine Clearance: 131.9 mL/min (by C-G formula based on SCr of 0.84 mg/dL).  COAG Lab Results  Component Value Date   INR 1.5 (H) 09/17/2021    Radiology CT Angio Chest PE W and/or Wo Contrast  Result Date: 09/17/2021 CLINICAL DATA:  Pulmonary embolism (PE) suspected, high prob. Hematuria, dyspnea EXAM: CT ANGIOGRAPHY CHEST WITH CONTRAST TECHNIQUE: Multidetector CT imaging of the chest was performed using the standard protocol during bolus administration of intravenous contrast. Multiplanar CT image reconstructions and MIPs were obtained to evaluate the vascular anatomy. RADIATION DOSE REDUCTION: This exam was performed according to the departmental dose-optimization program which includes automated exposure control, adjustment of the mA  and/or kV according to patient size and/or use of iterative reconstruction technique. CONTRAST:  256m OMNIPAQUE IOHEXOL 350 MG/ML SOLN (due to requirement for second bolus) COMPARISON:  None Available. FINDINGS: Cardiovascular: Technically limited examination due to bolus timing, body habitus, and respiratory motion artifact. No intraluminal filling defect identified within the main, right, and left pulmonary arteries. Lobar, segmental, and subsegmental pulmonary arteries are not adequately opacified to definitively exclude the presence of acute pulmonary emboli. Central pulmonary arteries are enlarged in keeping with changes of pulmonary arterial hypertension. No significant coronary artery calcification. Global cardiac size within normal limits. No pericardial effusion. Mild atherosclerotic calcification within the thoracic aorta. No aortic aneurysm. Mediastinum/Nodes: There is right axillary adenopathy identified as well as numerous enhancing lymph nodes within the right axilla demonstrating a matted appearance with perinodal infiltration likely related to inflammation or nodal metastatic disease. There is asymmetric edema within the right axilla noted. Right hilar adenopathy is present, possibly reactive in nature. Visualized thyroid is unremarkable. Esophagus is unremarkable. Lungs/Pleura: There is multifocal consolidation within the lungs bilaterally, in keeping with changes of acute multifocal pneumonia in the appropriate clinical setting. No pneumothorax or pleural effusion. Central airways are widely patent. Upper Abdomen: No acute abnormality. Musculoskeletal: No chest wall abnormality. No acute or significant osseous findings. Review of the MIP images confirms the above findings. IMPRESSION: Technically limited examination without evidence of large central pulmonary embolus. Lobar and peripheral pulmonary arterial vasculature is not adequately opacified on this examination to definitively exclude the  presence of acute pulmonary embolus. Morphologic changes in keeping with pulmonary arterial hypertension. Multifocal pulmonary consolidation in keeping with multifocal pneumonic consolidation in the appropriate clinical setting. No central obstructing lesion. Probable reactive right hilar adenopathy. Enhancing right axillary adenopathy demonstrating perinodal infiltration in keeping with acute infection, inflammation or neoplastic infiltration. Asymmetric right axillary edema. Correlation with clinical examination including right breast  examination is recommended. Electronically Signed   By: Fidela Salisbury M.D.   On: 09/17/2021 04:34   US ARTERIAL LOWER EXTREMITY DUPLEX LEFT (NON-ABI)  Result Date: 09/19/2021 CLINICAL DATA:  Left lower extremity pain.  Evaluate for PAD. EXAM: LEFT LOWER EXTREMITY ARTERIAL DUPLEX SCAN TECHNIQUE: Gray-scale sonography as well as color Doppler and duplex ultrasound was performed to evaluate the lower extremity arteries including the common, superficial and profunda femoral arteries, popliteal artery and calf arteries. COMPARISON:  None Available. FINDINGS: Left lower Extremity: Left Common femoral artery: Triphasic waveform; 201 cm/sec Normal waveform is demonstrated within the left common femoral artery however there is borderline elevated peak systolic velocity of 169 cm/sec, findings suggestive of potential inflow (aortoiliac disease). Left superficial femoral artery: Biphasic waveform Proximal: 109 cm/sec Mid: 118 cm/sec Distal: 81 cm/sec Left deep femoral artery: Biphasic waveform; 90 cm/sec Left popliteal artery: Biphasic waveform; 78 cm/sec Biphasic waveforms are demonstrated throughout the interrogated portions of the left superficial, and deep femoral arteries as well as the popliteal artery. Velocity differences between the mid and distal aspects of the left superficial femoral artery suggests hemodynamically significant narrowing regional to this location. Left anterior  tibial artery: Monophasic waveform; 18 cm/sec Left posterior tibial artery: Biphasic waveform; 97 cm/sec Tibial runoff is patent the level of the lower leg however blunted monophasic waveform is demonstrated within the left anterior tibial artery with associated markedly reduced velocity suggestive of atretic flow. Biphasic waveform is demonstrated within the left posterior tibial artery. IMPRESSION: Suspected hemodynamically significant narrowings involving inflow, outflow and tibial runoff of the left lower extremity. Further evaluation with CTA run-off could be performed as indicated. Electronically Signed   By: Sandi Mariscal M.D.   On: 09/19/2021 10:36      Assessment/Plan 1.  Peripheral arterial disease Given the patient's noninvasive studies today it is certainly possible that her lower extremity pain is related to decreased perfusion.  She was started on a heparin drip today.  This should be helpful with perfusion until she is able to undergo angiogram.  Due to her bacteremia we will plan on doing an angiogram possibly later this week or possibly early next week, once she is at a more stable standpoint.  2.  Hypertension 3. ***  Family Communication: No family at bedside   Kris Hartmann, NP [facility] [phone] (Office Phone) [phone] (Office Fax)  09/19/2021 4:29 PM    This note was created with Dragon medical transcription system.  Any error is purely unintentional

## 2021-09-19 NOTE — Consult Note (Signed)
Pharmacy Antibiotic Note  Krystal Francis is a 61 y.o. female w/ h/o RV failure, HTN, GERD, PE PTA on Lovenox, iron deficiency anemia, urinary retention with Foley catheter placement, uterine bleeding, morbid obesity with BMI 78.27, who presents with shortness of breath > w/u c/w PE & multifocal PNA. Pt admitted on 09/15/2021 with pneumonia and bacteremia & BCID resulted with MRSA 4 of 4 vials on 5/20.  Pharmacy has been consulted for escalation of abx to Vancomycin dosing.  Today, 09/19/2021 Day # 3 vancomycin MRSA bacteremia Renal: SCr stable WBC 24.4 increased Repeat blood cx pending Vancomycin levels 5/22 on 1gm IV q12h (with 5th maintenance dose Dose given at ___ Vancomycin peak at 2130 = ___mcg/mL Vancomycin trough 5/23 at 0530 = ___ mcg/mL  Plan: -- Continue vancomycin 1.5g q12h (5/20 1900>> Goal AUC 400-550  Est AUC: 494.5; Cmax: 28.7; Cmin: 15.5 SCr 0.8 (rounded from 0.62; IBW; Vd 0.5 --check vancomycin levels tonight     Height: '5\' 4"'$  (162.6 cm) Weight: (!) 207.7 kg (458 lb) IBW/kg (Calculated) : 54.7  Temp (24hrs), Avg:98.4 F (36.9 C), Min:97.8 F (36.6 C), Max:98.9 F (37.2 C)  Recent Labs  Lab 09/15/21 1328 09/17/21 0050 09/17/21 0606 09/17/21 1605 09/18/21 0007 09/18/21 0817 09/19/21 0506 09/19/21 1230  WBC 15.0* 16.7*  --  16.4* 16.8* 21.1*  --  24.4*  CREATININE 0.62  --   --   --  0.78  --  0.84  --   LATICACIDVEN  --  0.9 1.4  --   --   --   --   --      Estimated Creatinine Clearance: 131.9 mL/min (by C-G formula based on SCr of 0.84 mg/dL).    No Known Allergies  Antimicrobials this admission: VAN (5/20 >>  Azithro (5/20-5/21) CRO x1 in ED (5/20)  Dose adjustments this admission: CTM and adjust PRN  Microbiology results: 5/20 BCx: BCID 4 of 4 vials MRSA 5/20 Sputum: GPCs and GvarR  5/20 Flu/Cov PCR: negative  Thank you for allowing pharmacy to be a part of this patient's care.  Doreene Eland, PharmD, BCPS, BCIDP Work Cell:  [phone] 09/19/2021 4:13 PM

## 2021-09-19 NOTE — Consult Note (Incomplete)
NAME: Krystal Francis  DOB: [birth year]  MRN: [id number]  Date/Time: 09/19/2021 1:36 PM  REQUESTING PROVIDER: [email or web address] Subjective:  REASON FOR CONSULT: MRSA bacteremia   Krystal Francis is a 61 y.o. with a history of PE, post menopausal bleed. Morbid obesity, presents to the ED thru EMS for blood in the foley and not seeing urine Pt has had repeated admissions in the past 3 months.  In March 25 she presented to [facility] with uterine bleed.  Her hemoglobin was seven 5.4.  She was given blood transfusions into 4 units and transferred to [facility].  She underwent hysteroscopy and endometrial biopsy on 07/25/2021.  And was put on Provera .  The biopsy was normal.  She was discharged home only to get readmitted on 08/03/2021 with shortness of breath.  Was diagnosed with acute pulmonary embolism of the right side with right heart strain.  Started on Lovenox.  She stayed in the hospital till 09/01/2021. She also has lymphedema left worse than the right with cellulitis in the past She had a central line during that hospitalization which was removed before discharge.  After she went home she could not get out of her hospital bed and hence was finding it difficult to go to the bathroom and hence called her PCP and she inserted a Foley on 09/09/2021.  As per patient the Foley started to give trouble and there was blood in the Foley and hence she came to the hospital. Vitals in the ED on 09/15/2021 BP of 107 x 54 pulse 95, temperature 98.2, resp rate 20, .  The Foley was replaced and the urine output has improved WBC 15, Hb 8.6, platelet 212 and creatinine 0.62 Blood culture was sent and it is MRSA and I am asked to see the patient.  CT chest It shows pulmonary consolidation which is multifocal.  Past Medical History:  Diagnosis Date  . Anemia, unspecified   . Hypertension   Vaginal Bleed PE - Past Surgical History:  Procedure Laterality Date  . DILATION AND CURETTAGE OF UTERUS      Social History   Socioeconomic History  .  Marital status: Single    Spouse name: Not on file  . Number of children: Not on file  . Years of education: Not on file  . Highest education level: Not on file  Occupational History  . Not on file  Tobacco Use  . Smoking status: Never  . Smokeless tobacco: Never  Vaping Use  . Vaping Use: Never used  Substance and Sexual Activity  . Alcohol use: Never  . Drug use: Never  . Sexual activity: Never  Other Topics Concern  . Not on file  Social History Narrative  . Not on file   Social Determinants of Health   Financial Resource Strain: Not on file  Food Insecurity: Not on file  Transportation Needs: Not on file  Physical Activity: Not on file  Stress: Not on file  Social Connections: Not on file  Intimate Partner Violence: Not on file    Family History  Problem Relation Age of Onset  . Hypertension Mother   . Hypertension Father   . Diabetes Mellitus I Father    No Known Allergies I  Current Facility-Administered Medications  Medication Dose Route Frequency Provider Last Rate Last Admin  . acetaminophen (TYLENOL) tablet 650 mg  650 mg Oral Q6H PRN Mansy, Jan A, MD       Or  . acetaminophen (TYLENOL) suppository 650 mg  650 mg Rectal Q6H  PRN Mansy, Jan A, MD      . albuterol (PROVENTIL) (2.5 MG/3ML) 0.083% nebulizer solution 3 mL  3 mL Inhalation Q4H PRN Ivor Costa, MD      . Chlorhexidine Gluconate Cloth 2 % PADS 6 each  6 each Topical Daily Mansy, Arvella Merles, MD   6 each at 09/19/21 1044  . chlorpheniramine-HYDROcodone 10-8 MG/5ML suspension 5 mL  5 mL Oral Q12H PRN Mansy, Jan A, MD      . feeding supplement (ENSURE ENLIVE / ENSURE PLUS) liquid 237 mL  237 mL Oral BID BM Sharen Hones, MD   237 mL at 09/19/21 1009  . guaiFENesin (MUCINEX) 12 hr tablet 600 mg  600 mg Oral BID Mansy, Jan A, MD   600 mg at 09/19/21 0959  . heparin ADULT infusion 100 units/mL (25000 units/241m)  2,900 Units/hr Intravenous Continuous BLorna Dibble RPH 27.5 mL/hr at 09/19/21 0615 2,750  Units/hr at 09/19/21 0615  . heparin bolus via infusion 1,600 Units  1,600 Units Intravenous Once Beers, BShanon Brow RPH      . hydrALAZINE (APRESOLINE) injection 5 mg  5 mg Intravenous Q2H PRN NIvor Costa MD      . HYDROcodone-acetaminophen (NORCO/VICODIN) 5-325 MG per tablet 1 tablet  1 tablet Oral Q4H PRN SCarrie Mew MD   1 tablet at 09/18/21 1530  . HYDROmorphone (DILAUDID) injection 0.5 mg  0.5 mg Intravenous Q4H PRN ZSharen Hones MD      . ipratropium-albuterol (DUONEB) 0.5-2.5 (3) MG/3ML nebulizer solution 3 mL  3 mL Nebulization BID NIvor Costa MD   3 mL at 09/19/21 0803  . iron polysaccharides (NIFEREX) capsule 150 mg  150 mg Oral Daily ZSharen Hones MD   150 mg at 09/19/21 0959  . magnesium hydroxide (MILK OF MAGNESIA) suspension 30 mL  30 mL Oral Daily PRN Mansy, Jan A, MD      . magnesium oxide (MAG-OX) tablet 400 mg  400 mg Oral Daily GNance Pear MD   400 mg at 09/19/21 0958  . medroxyPROGESTERone (PROVERA) tablet 10 mg  10 mg Oral BID GNance Pear MD   10 mg at 09/19/21 0959  . multivitamin with minerals tablet 1 tablet  1 tablet Oral Daily ZSharen Hones MD   1 tablet at 09/19/21 1001  . ondansetron (ZOFRAN) tablet 4 mg  4 mg Oral Q6H PRN Mansy, Jan A, MD       Or  . ondansetron ([facility] injection 4 mg  4 mg Intravenous Q6H PRN Mansy, Jan A, MD      . pantoprazole (PROTONIX) EC tablet 40 mg  40 mg Oral Daily GNance Pear MD   40 mg at 09/19/21 1001  . senna-docusate (Senokot-S) tablet 2 tablet  2 tablet Oral BID ZSharen Hones MD   2 tablet at 09/19/21 0959  . torsemide (DEMADEX) tablet 20 mg  20 mg Oral Daily GNance Pear MD   20 mg at 09/19/21 0959  . traZODone (DESYREL) tablet 25 mg  25 mg Oral QHS PRN Mansy, Jan A, MD      . vancomycin (VANCOREADY) IVPB 1500 mg/300 mL  1,500 mg Intravenous Q12H BLorna Dibble RPH 150 mL/hr at 09/19/21 0648 1,500 mg at 09/19/21 04742    Abtx:  Anti-infectives (From admission, onward)    Start     Dose/Rate Route  Frequency Ordered Stop   09/17/21 1900  vancomycin (VANCOREADY) IVPB 1500 mg/300 mL        1,500 mg 150 mL/hr over 120 Minutes  Intravenous Every 12 hours 09/17/21 1857     09/17/21 1200  cefTRIAXone (ROCEPHIN) 2 g in sodium chloride 0.9 % 100 mL IVPB  Status:  Discontinued        2 g 200 mL/hr over 30 Minutes Intravenous Every 24 hours 09/17/21 0622 09/17/21 1844   09/17/21 1000  azithromycin (ZITHROMAX) 500 mg in sodium chloride 0.9 % 250 mL IVPB  Status:  Discontinued        500 mg 250 mL/hr over 60 Minutes Intravenous Every 24 hours 09/17/21 0622 09/18/21 1247   09/17/21 0445  ceFEPIme (MAXIPIME) 1 g in sodium chloride 0.9 % 100 mL IVPB        1 g 200 mL/hr over 30 Minutes Intravenous  Once 09/17/21 0441 09/18/21 0743   09/17/21 0445  vancomycin (VANCOREADY) IVPB 2000 mg/400 mL        2,000 mg 200 mL/hr over 120 Minutes Intravenous  Once 09/17/21 0441 09/18/21 1437       REVIEW OF SYSTEMS:  Const: negative fever, negative chills, negative weight loss Eyes: negative diplopia or visual changes, negative eye pain ENT: negative coryza, negative sore throat Resp: negative cough, hemoptysis, has dyspnea Cards: negative for chest pain, palpitations, has lower extremity edema GU: as above GI: Negative for abdominal pain, diarrhea, bleeding, constipation Skin: negative for rash and pruritus Heme: negative for easy bruising and gum/nose bleeding MS: poor mobility Neurolo:negative for headaches, dizziness, vertigo, memory problems  Psych: negative for feelings of anxiety, depression  Endocrine: negative for thyroid, diabetes Allergy/Immunology- negative for any medication or food allergies    Objective:  VITALS:  BP (!) 81/55   Pulse (!) 106   Temp 98.7 F (37.1 C)   Resp 19   Ht '5\' 4"'$  (1.626 m)   Wt (!) 207.7 kg   SpO2 95%   BMI 78.62 kg/m  LDA Foley  PHYSICAL EXAM:  General: Alert, cooperative, no distress, appears stated age.  Head: Normocephalic, without obvious  abnormality, atraumatic. Eyes: Conjunctivae clear, anicteric sclerae. Pupils are equal ENT Nares normal. No drainage or sinus tenderness. Lips, mucosa, and tongue normal. No Thrush Neck: Supple, symmetrical, no adenopathy, thyroid: non tender no carotid bruit and no JVD. Back: di not examine Lungs: Clear to auscultation bilaterally. No Wheezing or Rhonchi. No rales. Heart: Regular rate and rhythm, no murmur, rub or gallop. Abdomen: Soft, non-tender,not distended. Bowel sounds normal. No masses Extremities: lower extremities-lymphedema Skin: No rashes or lesions. Or bruising Lymph: Cervical, supraclavicular normal. Neurologic: Grossly non-focal Pertinent Labs Lab Results CBC    Component Value Date/Time   WBC 24.4 (H) 09/19/2021 1230   RBC 3.57 (L) 09/19/2021 1230   HGB 7.8 (L) 09/19/2021 1230   HCT 25.8 (L) 09/19/2021 1230   PLT 312 09/19/2021 1230   MCV 72.3 (L) 09/19/2021 1230   MCH 21.8 (L) 09/19/2021 1230   MCHC 30.2 09/19/2021 1230   RDW 22.0 (H) 09/19/2021 1230   LYMPHSABS 1.6 09/17/2021 0050   MONOABS 1.2 (H) 09/17/2021 0050   EOSABS 0.1 09/17/2021 0050   BASOSABS 0.1 09/17/2021 0050       Latest Ref Rng & Units 09/19/2021    5:06 AM 09/18/2021   12:07 AM 09/15/2021    1:28 PM  CMP  Glucose 70 - 99 mg/dL 110   110   121    BUN 6 - 20 mg/dL '21   20   16    '$ Creatinine 0.44 - 1.00 mg/dL 0.84   0.78   0.62  Sodium 135 - 145 mmol/L 133   135   133    Potassium 3.5 - 5.1 mmol/L 3.8   4.1   4.5    Chloride 98 - 111 mmol/L 98   99   99    CO2 22 - 32 mmol/L '24   27   21    '$ Calcium 8.9 - 10.3 mg/dL 8.0   8.0   8.8        Microbiology: Recent Results (from the past 240 hour(s))  Blood culture (routine x 2)     Status: Abnormal (Preliminary result)   Collection Time: 09/17/21  5:12 AM   Specimen: BLOOD  Result Value Ref Range Status   Specimen Description   Final    BLOOD BLOOD LEFT WRIST Performed at [facility], [address] [place], [place]  [address]    Special Requests   Final    BOTTLES DRAWN AEROBIC AND ANAEROBIC Blood Culture adequate volume Performed at [facility], [address] [place], [place] [address]    Culture  Setup Time   Final    GRAM POSITIVE COCCI IN BOTH AEROBIC AND ANAEROBIC BOTTLES CRITICAL RESULT CALLED TO, READ BACK BY AND VERIFIED WITH: BRANDON BEERS @ 1822 09/17/21 LFD GRAM STAIN REVIEWED-AGREE WITH RESULT    Culture (A)  Final    STAPHYLOCOCCUS AUREUS CULTURE REINCUBATED FOR BETTER GROWTH Performed at [facility], [place] [address] [place], [place] [address]    Report Status PENDING  Incomplete  Blood culture (routine x 2)     Status: Abnormal (Preliminary result)   Collection Time: 09/17/21  5:12 AM   Specimen: BLOOD  Result Value Ref Range Status   Specimen Description   Final    BLOOD BLOOD LEFT FOREARM Performed at [facility], [address] [place], [place] [address]    Special Requests   Final    BOTTLES DRAWN AEROBIC AND ANAEROBIC Blood Culture results may not be optimal due to an inadequate volume of blood received in culture bottles Performed at [facility], [address] [place], [place] [address]    Culture  Setup Time   Final    Organism ID to follow [place] CRITICAL RESULT CALLED TO, READ BACK BY AND VERIFIED WITH: BRANDON BEERS '@1822'$  09/17/21 LFD GRAM STAIN REVIEWED-AGREE WITH RESULT    Culture (A)  Final    STAPHYLOCOCCUS AUREUS SUSCEPTIBILITIES TO FOLLOW Performed at [facility], [place] [address], [place], [place] [address]    Report Status PENDING  Incomplete  Blood Culture ID Panel (Reflexed)     Status: Abnormal   Collection Time: 09/17/21  5:12 AM  Result Value Ref Range Status   Enterococcus faecalis NOT DETECTED NOT DETECTED Final   Enterococcus Faecium NOT DETECTED NOT DETECTED Final   Listeria monocytogenes NOT DETECTED NOT DETECTED Final   Staphylococcus  species DETECTED (A) NOT DETECTED Final    Comment: CRITICAL RESULT CALLED TO, READ BACK BY AND VERIFIED WITH: BRANDON BEERS ON 09/17/21 AT 1822 LFD    Staphylococcus aureus (BCID) DETECTED (A) NOT DETECTED Final    Comment: Methicillin (oxacillin)-resistant Staphylococcus aureus (MRSA). MRSA is predictably resistant to beta-lactam antibiotics (except ceftaroline). Preferred therapy is vancomycin unless clinically contraindicated. Patient requires contact precautions if  hospitalized. CRITICAL RESULT CALLED TO, READ BACK BY AND VERIFIED WITH: BRANDON BEERS @ 0093 09/17/21 LFD    Staphylococcus epidermidis NOT DETECTED NOT DETECTED Final  Staphylococcus lugdunensis NOT DETECTED NOT DETECTED Final   Streptococcus species NOT DETECTED NOT DETECTED Final   Streptococcus agalactiae NOT DETECTED NOT DETECTED Final   Streptococcus pneumoniae NOT DETECTED NOT DETECTED Final   Streptococcus pyogenes NOT DETECTED NOT DETECTED Final   A.calcoaceticus-baumannii NOT DETECTED NOT DETECTED Final   Bacteroides fragilis NOT DETECTED NOT DETECTED Final   Enterobacterales NOT DETECTED NOT DETECTED Final   Enterobacter cloacae complex NOT DETECTED NOT DETECTED Final   Escherichia coli NOT DETECTED NOT DETECTED Final   Klebsiella aerogenes NOT DETECTED NOT DETECTED Final   Klebsiella oxytoca NOT DETECTED NOT DETECTED Final   Klebsiella pneumoniae NOT DETECTED NOT DETECTED Final   Proteus species NOT DETECTED NOT DETECTED Final   Salmonella species NOT DETECTED NOT DETECTED Final   Serratia marcescens NOT DETECTED NOT DETECTED Final   Haemophilus influenzae NOT DETECTED NOT DETECTED Final   Neisseria meningitidis NOT DETECTED NOT DETECTED Final   Pseudomonas aeruginosa NOT DETECTED NOT DETECTED Final   Stenotrophomonas maltophilia NOT DETECTED NOT DETECTED Final   Candida albicans NOT DETECTED NOT DETECTED Final   Candida auris NOT DETECTED NOT DETECTED Final   Candida glabrata NOT DETECTED NOT DETECTED  Final   Candida krusei NOT DETECTED NOT DETECTED Final   Candida parapsilosis NOT DETECTED NOT DETECTED Final   Candida tropicalis NOT DETECTED NOT DETECTED Final   Cryptococcus neoformans/gattii NOT DETECTED NOT DETECTED Final   Meth resistant mecA/C and MREJ DETECTED (A) NOT DETECTED Final    Comment: CRITICAL RESULT CALLED TO, READ BACK BY AND VERIFIED WITH: BRANDON BEERS @ 1822 09/17/21 LFD Performed at [facility], [address] [place], [place] [address]   Expectorated Sputum Assessment w Gram Stain, Rflx to Resp Cult     Status: None   Collection Time: 09/17/21  8:57 AM   Specimen: Expectorated Sputum  Result Value Ref Range Status   Specimen Description EXPECTORATED SPUTUM  Final   Special Requests NONE  Final   Sputum evaluation   Final    THIS SPECIMEN IS ACCEPTABLE FOR SPUTUM CULTURE Performed at [facility], [address] [place], [place] [address]    Report Status 09/17/2021 FINAL  Final  Resp Panel by RT-PCR (Flu A&B, Covid) Nasopharyngeal Swab     Status: None   Collection Time: 09/17/21  8:57 AM   Specimen: Nasopharyngeal Swab; Nasopharyngeal(NP) swabs in vial transport medium  Result Value Ref Range Status   SARS Coronavirus 2 by RT PCR NEGATIVE NEGATIVE Final    Comment: (NOTE) SARS-CoV-2 target nucleic acids are NOT DETECTED.  The SARS-CoV-2 RNA is generally detectable in upper respiratory specimens during the acute phase of infection. The lowest concentration of SARS-CoV-2 viral copies this assay can detect is 138 copies/mL. A negative result does not preclude SARS-Cov-2 infection and should not be used as the sole basis for treatment or other patient management decisions. A negative result may occur with  improper specimen collection/handling, submission of specimen other than nasopharyngeal swab, presence of viral mutation(s) within the areas targeted by this assay, and inadequate number of viral copies(<138 copies/mL). A negative  result must be combined with clinical observations, patient history, and epidemiological information. The expected result is Negative.  Fact Sheet for Patients:  EntrepreneurPulse.com.au  Fact Sheet for Healthcare Providers:  IncredibleEmployment.be  This test is no t yet approved or cleared by the [place] FDA and  has been authorized for detection and/or diagnosis of SARS-CoV-2 by FDA under an Emergency Use Authorization (EUA). This EUA  will remain  in effect (meaning this test can be used) for the duration of the COVID-19 declaration under Section 564(b)(1) of the Act, 21 U.S.C.section 360bbb-3(b)(1), unless the authorization is terminated  or revoked sooner.       Influenza A by PCR NEGATIVE NEGATIVE Final   Influenza B by PCR NEGATIVE NEGATIVE Final    Comment: (NOTE) The Xpert Xpress SARS-CoV-2/FLU/RSV plus assay is intended as an aid in the diagnosis of influenza from Nasopharyngeal swab specimens and should not be used as a sole basis for treatment. Nasal washings and aspirates are unacceptable for Xpert Xpress SARS-CoV-2/FLU/RSV testing.  Fact Sheet for Patients: EntrepreneurPulse.com.au  Fact Sheet for Healthcare Providers: IncredibleEmployment.be  This test is not yet approved or cleared by the [place] FDA and has been authorized for detection and/or diagnosis of SARS-CoV-2 by FDA under an Emergency Use Authorization (EUA). This EUA will remain in effect (meaning this test can be used) for the duration of the COVID-19 declaration under Section 564(b)(1) of the Act, 21 U.S.C. section 360bbb-3(b)(1), unless the authorization is terminated or revoked.  Performed at [facility], [address] [place], [place] [address]   Culture, Respiratory w Gram Stain     Status: None (Preliminary result)   Collection Time: 09/17/21  8:57 AM  Result Value Ref Range Status   Specimen  Description   Final    EXPECTORATED SPUTUM Performed at [facility], [address], [place], [place] [address]    Special Requests   Final    NONE Reflexed from [id number] Performed at [facility], [place], [place] [address]    Gram Stain   Final    RARE NO ORGANISMS SEEN RARE GRAM POSITIVE COCCI IN PAIRS RARE GRAM VARIABLE ROD    Culture   Final    CULTURE REINCUBATED FOR BETTER GROWTH Performed at [facility], [place] [address] [place], [place] [address]    Report Status PENDING  Incomplete  Culture, blood (Routine X 2) w Reflex to ID Panel     Status: None (Preliminary result)   Collection Time: 09/19/21  5:06 AM   Specimen: BLOOD  Result Value Ref Range Status   Specimen Description BLOOD RIGHT HAND  Final   Special Requests   Final    BOTTLES DRAWN AEROBIC AND ANAEROBIC Blood Culture results may not be optimal due to an inadequate volume of blood received in culture bottles   Culture   Final    NO GROWTH <12 HOURS Performed at [facility], [address], [place], [place] [address]    Report Status PENDING  Incomplete    IMAGING RESULTS:  I have personally reviewed the films  b/lpulmonary infiltrate  Impression/Recommendation MRSA bacteremia Pt was in [facility] for 4 weeks and had central line until 5/4- with b/l pulmonary infiltrates and MRSA nares need to r/o MRSA pneumonia VS septic emboli to the lungs due to MRSA bacteremia Need to r/o infective endocarditis of tricuspid valve  Agree with vanco- duration depends on the TEE Needs 2d echo and if unrevealing will need TEE Repeat Blood culture  Anemia- recent vaginal bleed- needing multiple units of PRBC in March 2023 Recent PE with rt heart strain was on lovenox    has foley for convenience- would recommend purewick  Lymphedema legs left worse than right. ___________________________________________________ Discussed with patient, and requesting provider Note:   This document was prepared using Dragon voice recognition software and may include unintentional dictation errors.

## 2021-09-19 NOTE — Consult Note (Addendum)
ANTICOAGULATION CONSULT NOTE   Pharmacy Consult for Heparin Indication: pulmonary embolus  No Known Allergies  Patient Measurements: Height: '5\' 4"'$  (162.6 cm) Weight: (!) 207.7 kg (458 lb) IBW/kg (Calculated) : 54.7 Heparin Dosing Weight: 109 kg  Vital Signs: Temp: 98.7 F (37.1 C) (05/22 1319) Temp Source: Oral (05/22 1200) BP: 81/55 (05/22 1319) Pulse Rate: 106 (05/22 1319)  Labs: Recent Labs    09/17/21 0050 09/17/21 0425 09/17/21 0606 09/17/21 1022 09/17/21 1600 09/18/21 0007 09/18/21 0817 09/18/21 1448 09/18/21 2156 09/19/21 0506 09/19/21 1230  HGB 7.7*  --   --   --    < > 7.5* 8.1*  --   --   --  7.8*  HCT 26.5*  --   --   --    < > 25.9* 27.7*  --   --   --  25.8*  PLT 274  --   --   --    < > 274 284  --   --   --  312  APTT  --   --   --  38*  --   --   --   --   --   --   --   LABPROT  --   --   --  18.0*  --   --   --   --   --   --   --   INR  --   --   --  1.5*  --   --   --   --   --   --   --   HEPARINUNFRC  --   --   --   --    < > 0.17* 0.33   < > 0.32 0.28* 0.26*  CREATININE  --   --   --   --   --  0.78  --   --   --  0.84  --   TROPONINIHS '8 8 8  '$ --   --   --   --   --   --   --   --    < > = values in this interval not displayed.     Estimated Creatinine Clearance: 131.9 mL/min (by C-G formula based on SCr of 0.84 mg/dL).   Medical History: Past Medical History:  Diagnosis Date   Anemia, unspecified    Hypertension     Medications:  Medications Prior to Admission  Medication Sig Dispense Refill Last Dose   enoxaparin (LOVENOX) 120 MG/0.8ML injection Inject 120 mg into the skin daily.   09/14/2021   ferrous sulfate 325 (65 FE) MG EC tablet Take 1 tablet (325 mg total) by mouth 3 (three) times daily with meals. 90 tablet 3 09/14/2021   furosemide (LASIX) 40 MG tablet Take 40 mg by mouth daily.   09/14/2021   magnesium oxide (MAG-OX) 400 (240 Mg) MG tablet Take 1 tablet by mouth daily.   09/14/2021   medroxyPROGESTERone (PROVERA) 10 MG  tablet Take 10 mg by mouth in the morning and at bedtime.   09/14/2021   pantoprazole (PROTONIX) 40 MG tablet Take 40 mg by mouth daily.   09/14/2021   torsemide (DEMADEX) 20 MG tablet Take 20 mg by mouth daily.   09/14/2021   vitamin B-12 (CYANOCOBALAMIN) 1000 MCG tablet Take by mouth.   09/14/2021   Scheduled:   Chlorhexidine Gluconate Cloth  6 each Topical Daily   feeding supplement  237 mL Oral BID BM   guaiFENesin  600 mg Oral BID   ipratropium-albuterol  3 mL Nebulization BID   iron polysaccharides  150 mg Oral Daily   magnesium oxide  400 mg Oral Daily   medroxyPROGESTERone  10 mg Oral BID   multivitamin with minerals  1 tablet Oral Daily   pantoprazole  40 mg Oral Daily   senna-docusate  2 tablet Oral BID   torsemide  20 mg Oral Daily   Infusions:   heparin 2,750 Units/hr (09/19/21 0615)   vancomycin 1,500 mg (09/19/21 0648)   PRN: acetaminophen **OR** acetaminophen, albuterol, chlorpheniramine-HYDROcodone, hydrALAZINE, HYDROcodone-acetaminophen, HYDROmorphone (DILAUDID) injection, magnesium hydroxide, ondansetron **OR** ondansetron (ZOFRAN) IV, traZODone Anti-infectives (From admission, onward)    Start     Dose/Rate Route Frequency Ordered Stop   09/17/21 1900  vancomycin (VANCOREADY) IVPB 1500 mg/300 mL        1,500 mg 150 mL/hr over 120 Minutes Intravenous Every 12 hours 09/17/21 1857     09/17/21 1200  cefTRIAXone (ROCEPHIN) 2 g in sodium chloride 0.9 % 100 mL IVPB  Status:  Discontinued        2 g 200 mL/hr over 30 Minutes Intravenous Every 24 hours 09/17/21 0622 09/17/21 1844   09/17/21 1000  azithromycin (ZITHROMAX) 500 mg in sodium chloride 0.9 % 250 mL IVPB  Status:  Discontinued        500 mg 250 mL/hr over 60 Minutes Intravenous Every 24 hours 09/17/21 0622 09/18/21 1247   09/17/21 0445  ceFEPIme (MAXIPIME) 1 g in sodium chloride 0.9 % 100 mL IVPB        1 g 200 mL/hr over 30 Minutes Intravenous  Once 09/17/21 0441 09/18/21 0743   09/17/21 0445  vancomycin  (VANCOREADY) IVPB 2000 mg/400 mL        2,000 mg 200 mL/hr over 120 Minutes Intravenous  Once 09/17/21 0441 09/18/21 1437     Heparin Dosing Weight: 109 kg  Assessment: 61yo F w/ h/o RV failure, HTN, GERD, PE PTA on Lovenox, iron deficiency anemia, urinary retention with Foley catheter placement, uterine bleeding, morbid obesity with BMI 78.27, who presents with shortness of breath. Pharmacy consulted to start heparin for VTE. Pt was diagnosis in April 2023 per MD. Pt was discharge from [facility] on Enoxaparin 120 mg BID. Last anti-xa level was 0.75 (therapeutic). Pt has some hematuria so switching enoxaparin to heparin.   Note: PTA - pt received enoxaparin 120 mg x 1 5/19 '@2145'$ .   Date Time  HL Rate/Comment 5/20 1600 0.13 Subtherapeutic; 1700>2100 un/hr  5/21 0007 0.17 Subtherapeutic; 2100>2400 un/hr  5/21 0817 0.33 Therapeutic x1; 2400 un/hr 5/21 1448 0.26 Subtherapeutic; 2400 > 2600 un/hr 5/21 2156 0.32 Therapeutic x 1 5/22 0506 0.28 Subtherapeutic; 2600>2750 un/hr 5/22 1230 0.26 Subtherapeutic; 2750>2900 un/hr   Baseline Labs: aPTT - 38s INR - 1.5 Hgb -8.1>7.8 Plts - [phone]  Goal of Therapy:  Heparin level 0.3-0.7 units/ml Monitor platelets by anticoagulation protocol: Yes   Plan:  Increase heparin infusion rate to 2900 units/hr.  Recheck HL in 6 hr to confirm; then daily once consecutively therapeutic. CTM CBC daily while on heparin.   Lorna Dibble, PharmD, [facility] Clinical Pharmacist 09/19/2021 1:25 PM

## 2021-09-19 NOTE — Plan of Care (Signed)
  Problem: Nutrition: Goal: Adequate nutrition will be maintained Outcome: Progressing   Problem: Activity: Goal: Risk for activity intolerance will decrease Outcome: Progressing   Problem: Safety: Goal: Ability to remain free from injury will improve Outcome: Progressing   Problem: Pain Managment: Goal: General experience of comfort will improve Outcome: Progressing

## 2021-09-19 NOTE — Progress Notes (Signed)
PT Cancellation Note  Patient Details Name: Krystal Francis MRN: [id number] DOB: Oct 18, 1960   Cancelled Treatment:    Reason Eval/Treat Not Completed: Other (comment) Attempted to see pt for PT tx but nurse reports pt is in pain but unable to receive pain medication at this time due to very low BP  (systolic in the 65B per nurse report) & elevated HR at rest. Pt not appropriate for PT intervention at this time. Will f/u as able.  Lavone Nian, PT, DPT 09/19/21, 2:17 PM  [name] 09/19/2021, 2:17 PM

## 2021-09-19 NOTE — TOC Progression Note (Signed)
Transition of Care [facility]) - Progression Note    Patient Details  Name: Krystal Francis MRN: [id number] Date of Birth: 08-08-1960  Transition of Care [facility]) CM/SW [place], RN Phone Number: 09/19/2021, 12:08 PM  Clinical Narrative:    There are no bed offers at this time, Bedsearch resent throught the Hub, Will review offers once opbtained        Expected Discharge Plan and Services                                                 Social Determinants of Health (SDOH) Interventions    Readmission Risk Interventions     View : No data to display.

## 2021-09-20 ENCOUNTER — Inpatient Hospital Stay: Payer: Medicare Other

## 2021-09-20 DIAGNOSIS — J15212 Pneumonia due to Methicillin resistant Staphylococcus aureus: Secondary | ICD-10-CM | POA: Diagnosis not present

## 2021-09-20 DIAGNOSIS — R6520 Severe sepsis without septic shock: Secondary | ICD-10-CM | POA: Diagnosis not present

## 2021-09-20 DIAGNOSIS — I959 Hypotension, unspecified: Secondary | ICD-10-CM | POA: Diagnosis present

## 2021-09-20 DIAGNOSIS — B9562 Methicillin resistant Staphylococcus aureus infection as the cause of diseases classified elsewhere: Secondary | ICD-10-CM | POA: Diagnosis not present

## 2021-09-20 DIAGNOSIS — I82621 Acute embolism and thrombosis of deep veins of right upper extremity: Secondary | ICD-10-CM | POA: Diagnosis not present

## 2021-09-20 DIAGNOSIS — R7881 Bacteremia: Secondary | ICD-10-CM | POA: Diagnosis not present

## 2021-09-20 DIAGNOSIS — J189 Pneumonia, unspecified organism: Secondary | ICD-10-CM | POA: Diagnosis not present

## 2021-09-20 DIAGNOSIS — A419 Sepsis, unspecified organism: Secondary | ICD-10-CM | POA: Diagnosis not present

## 2021-09-20 LAB — BLOOD CULTURE ID PANEL (REFLEXED) - BCID2

## 2021-09-20 LAB — CULTURE, BLOOD (ROUTINE X 2): Special Requests: ADEQUATE

## 2021-09-20 LAB — CBC
HCT: 24.2 % — ABNORMAL LOW (ref 36.0–46.0)
Hemoglobin: 7.3 g/dL — ABNORMAL LOW (ref 12.0–15.0)
MCH: 21.9 pg — ABNORMAL LOW (ref 26.0–34.0)
MCHC: 30.2 g/dL (ref 30.0–36.0)
MCV: 72.5 fL — ABNORMAL LOW (ref 80.0–100.0)
Platelets: 304 10*3/uL (ref 150–400)
RBC: 3.34 MIL/uL — ABNORMAL LOW (ref 3.87–5.11)
RDW: 21.5 % — ABNORMAL HIGH (ref 11.5–15.5)
WBC: 19.7 10*3/uL — ABNORMAL HIGH (ref 4.0–10.5)
nRBC: 0.2 % (ref 0.0–0.2)

## 2021-09-20 LAB — PREPARE RBC (CROSSMATCH)

## 2021-09-20 LAB — BASIC METABOLIC PANEL
Anion gap: 10 (ref 5–15)
BUN: 24 mg/dL — ABNORMAL HIGH (ref 6–20)
CO2: 26 mmol/L (ref 22–32)
Calcium: 8.2 mg/dL — ABNORMAL LOW (ref 8.9–10.3)
Chloride: 99 mmol/L (ref 98–111)
Creatinine, Ser: 0.73 mg/dL (ref 0.44–1.00)
GFR, Estimated: >60 mL/min (ref >60)
Glucose, Bld: 109 mg/dL — ABNORMAL HIGH (ref 70–99)
Potassium: 3.4 mmol/L — ABNORMAL LOW (ref 3.5–5.1)
Sodium: 135 mmol/L (ref 135–145)

## 2021-09-20 LAB — TYPE AND SCREEN
ABO/RH(D): O POS
Antibody Screen: NEGATIVE

## 2021-09-20 LAB — MAGNESIUM: Magnesium: 1.7 mg/dL (ref 1.7–2.4)

## 2021-09-20 LAB — LACTIC ACID, PLASMA: Lactic Acid, Venous: 1.5 mmol/L (ref 0.5–1.9)

## 2021-09-20 LAB — GLUCOSE, CAPILLARY: Glucose-Capillary: 131 mg/dL — ABNORMAL HIGH (ref 70–99)

## 2021-09-20 LAB — HEPARIN LEVEL (UNFRACTIONATED): Heparin Unfractionated: 0.39 IU/mL (ref 0.30–0.70)

## 2021-09-20 LAB — VANCOMYCIN, TROUGH: Vancomycin Tr: 30 ug/mL (ref 15–20)

## 2021-09-20 LAB — PROCALCITONIN: Procalcitonin: 0.8 ng/mL

## 2021-09-20 MED ORDER — SODIUM CHLORIDE 0.9 % IV SOLN
1500.0000 mg | Freq: Two times a day (BID) | INTRAVENOUS | Status: DC
  Filled 2021-09-20: qty 30

## 2021-09-20 MED ORDER — SODIUM CHLORIDE 0.9 % IV SOLN
250.0000 mL | INTRAVENOUS | Status: DC
Start: 2021-09-20 — End: 2021-10-09
  Administered 2021-09-24: 250 mL via INTRAVENOUS

## 2021-09-20 MED ORDER — VANCOMYCIN HCL 1750 MG/350ML IV SOLN
1750.0000 mg | INTRAVENOUS | Status: DC
  Administered 2021-09-20 – 2021-09-25 (×6): 1750 mg via INTRAVENOUS
  Filled 2021-09-20 (×7): qty 350

## 2021-09-20 MED ORDER — NOREPINEPHRINE 4 MG/250ML-% IV SOLN
2.0000 ug/min | INTRAVENOUS | Status: DC
  Filled 2021-09-20: qty 250

## 2021-09-20 MED ORDER — POTASSIUM CHLORIDE CRYS ER 20 MEQ PO TBCR
40.0000 meq | EXTENDED_RELEASE_TABLET | Freq: Once | ORAL | Status: AC
  Administered 2021-09-20: 40 meq via ORAL
  Filled 2021-09-20: qty 2

## 2021-09-20 MED ORDER — POTASSIUM CHLORIDE 20 MEQ PO PACK
40.0000 meq | PACK | Freq: Two times a day (BID) | ORAL | Status: DC
  Administered 2021-09-20: 40 meq via ORAL
  Filled 2021-09-20: qty 2

## 2021-09-20 MED ORDER — METHYLPREDNISOLONE SODIUM SUCC 40 MG IJ SOLR
20.0000 mg | Freq: Two times a day (BID) | INTRAMUSCULAR | Status: DC
  Administered 2021-09-20 – 2021-09-21 (×2): 20 mg via INTRAVENOUS
  Filled 2021-09-20 (×2): qty 1

## 2021-09-20 MED ORDER — FUROSEMIDE 10 MG/ML IJ SOLN
20.0000 mg | Freq: Once | INTRAMUSCULAR | Status: AC
  Administered 2021-09-20: 20 mg via INTRAVENOUS
  Filled 2021-09-20: qty 2

## 2021-09-20 MED ORDER — METHYLPREDNISOLONE SODIUM SUCC 125 MG IJ SOLR
50.0000 mg | Freq: Two times a day (BID) | INTRAMUSCULAR | Status: DC
  Administered 2021-09-20: 50 mg via INTRAVENOUS
  Filled 2021-09-20: qty 2

## 2021-09-20 MED ORDER — SODIUM CHLORIDE 0.9% IV SOLUTION: Freq: Once | INTRAVENOUS | Status: AC

## 2021-09-20 MED ORDER — DIPHENHYDRAMINE HCL 25 MG PO CAPS
25.0000 mg | ORAL_CAPSULE | Freq: Four times a day (QID) | ORAL | Status: DC | PRN
  Administered 2021-09-25 – 2021-09-28 (×2): 25 mg via ORAL
  Filled 2021-09-20 (×2): qty 1

## 2021-09-20 NOTE — Consult Note (Signed)
NAME:  Krystal Francis, MRN:  [id number], DOB:  [birth year], LOS: 3 ADMISSION DATE:  09/15/2021, CONSULTATION DATE:  09/20/21 REFERRING MD:  Morton Amy, NP, CHIEF COMPLAINT:  Shortness of breath  History of Present Illness:  61 year old female presenting to [facility] ED on 09/15/2021 from home via EMS with complaints of hematuria and her chronic Foley catheter over the last couple of days.  ED course: 09/15/2021: patient was worked up for possible UTI, vaginal bleeding and/or Foley catheter obstruction or malfunction.  Foley catheter was replaced with immediate return of clear yellow urine and suspicion that Foley balloon may have irritated urethra causing some local bleeding.  Pelvic exam revealed no vaginal bleeding.  Patient was stabilized for discharge however she states she was unable to go home.  She is unable to get in and out of bed & complete ADLs.  EDP placed TOC consult as well as PT and OT consults. 09/17/2021: EDP called bedside after patient has been boarding for 35 hours due to complaints of shortness of breath with associated tachycardia.  Patient had not received any outpatient medications including her Lasix or Lovenox.  CT angio performed to rule out pulmonary embolus, limited due to body habitus but does not show any large PEs.  CT chest however concerning for multifocal pneumonia with lab work revealing leukocytosis.  Sepsis work-up completed with cefepime and vancomycin and 1 L IV fluid bolus.  TRH consulted for admission. Initial vitals: 99 F, tachycardic at 114, tachypneic at 26, marginal blood pressure 96/53 and hypoxic with SPO2 87% on room air.  This corrected to 93 to 95% on 3 L [place], which she uses intermittently at home. Hospital course: Patient admitted inpatient with suspected sepsis secondary to multifocal pneumonia.  Lab work revealed leukocytosis with WBC 16.7, BNP 67.1, troponin negative x3, no lactic acidosis, negative urinalysis.  Patient admitted to telemetry.  Rocephin and  azithromycin started, patient received 40 mg of Lasix in the ED and torsemide continue daily.  IV heparin initiated for prophylactic pulmonary embolism treatment. Blood culture positive for MRSA, ID consulted and vancomycin started.  Vascular surgery consulted due to significant peripheral vascular disease in the left leg.  No intervention at this time due to MRSA septicemia, heparin drip continued. Patient also has had a suspected allergic reaction to CHG wipes with scattered blisters, edema and redness, particularly RUE. Patient's BP has been marginal during admission with systolic in 40'G and MAP's 50-65. Patient received albumin supplementation totaling 37.5 as well as midodrine without significant impact. Patient transferred to ICU/SDU for peripheral levophed.  Significant labs: (Labs/ Imaging personally reviewed) I, Domingo Pulse [place], AGACNP-BC, personally viewed and interpreted this ECG. EKG Interpretation: Date: 09/17/21, EKG Time: 00:41, Rate: 108, Rhythm: ST, QRS Axis:  normal, Intervals: 1st degree HB, ST/T Wave abnormalities: none, Narrative Interpretation: ST with 1st degree HB Chemistry: Na+: 133, K+: 3.8, BUN/Cr.: 21/0.84, Serum CO2/ AG: 24/11 Hematology: WBC: 24.4, Hgb: 7.8,  Troponin: 8 > 8 > 8, BNP: 67.1, Lactic/ PCT: 0.9 > 1.4/ 0.34,  COVID-19 & Influenza A/B: negative  CT angio chest 09/17/21 : Technically limited examination without evidence of large central pulmonary embolus. Lobar and peripheral pulmonary arterial vasculature is not adequately opacified on this examination to definitively exclude the presence of acute pulmonary embolus. Morphologic changes in keeping with pulmonary arterial hypertension. Multifocal pulmonary consolidation in keeping with multifocal pneumonic consolidation in the appropriate clinical setting. No central obstructing lesion. Probable reactive right hilar adenopathy.  Enhancing right axillary adenopathy demonstrating perinodal infiltration  in keeping with acute infection, inflammation or neoplastic infiltration. Asymmetric right axillary edema. Correlation with clinical examination including right breast examination is recommended.   US arterial left lower extremity 09/18/21: Suspected hemodynamically significant narrowings involving inflow, outflow and tibial runoff of the left lower extremity. Further evaluation with CTA run-off could be performed as indicated.  PCCM consulted for additional management and monitoring due to circulatory shock requiring vasopressor administration.  Pertinent  Medical History  RV failure HTN GERD PE on lovenox Iron deficiency anemia Urinary retention with chronic foley catheter Uterine bleeding Morbid obesity BMI 78.27 HTN  Significant Hospital Events: Including procedures, antibiotic start and stop dates in addition to other pertinent events   09/17/21: admit to inpatient with sepsis s/t suspected HCAP. ID consulted, BCx + for MRSA 09/19/21: vascular consulted for severe LLE PVD 09/20/21: overnight transfer to ICU/SDU for potential vasopressors  Interim History / Subjective:  Patient alert & oriented, BP remains marginal 90's/40's with MAP 58-64. She is asymptomatic at this time. RUE swollen and painful, patient is covered with small blisters that are very painful. LLE remains painful with severe PVD  Objective   Blood pressure (!) 97/31, pulse (!) 108, temperature 98.8 F (37.1 C), temperature source Oral, resp. rate (!) 23, height '5\' 4"'$  (1.626 m), weight (!) 207.7 kg, SpO2 95 %.        Intake/Output Summary (Last 24 hours) at 09/20/2021 0150 Last data filed at 09/19/2021 2045 Gross per 24 hour  Intake 1450.66 ml  Output 1250 ml  Net 200.66 ml   Filed Weights   09/17/21 0305 09/17/21 0603 09/19/21 0500  Weight: (!) 206.8 kg (!) 206.8 kg (!) 207.7 kg    Examination: General: Adult female, acutely ill, lying in bed, NAD HEENT: MM pink/moist, anicteric, atraumatic, neck  supple Neuro: A&O x 4, able to follow commands, PERRL +3, MAE CV: s1s2 RRR, ST on monitor, no r/m/g Pulm: Regular, non labored on  , breath sounds clear-BUL & diminished-BLL GI: soft, obese, non tender, bs x 4 GU: foley in place with clear yellow urine Skin: small blisters covering body, RUE red/swollen/painful   Extremities: warm/dry, pulses + 2 R/P, +2 edema noted RUE  Resolved Hospital Problem list     Assessment & Plan:  Hypotension in the setting of MRSA bacteremia & RV failure Patient asymptomatic, only able to obtain BP from ankle due to body habitus and skin integrity - agree with midodrine - peripheral vasopressors PRN to maintain MAP > 60 - continuous cardiac monitoring  RUE edema and blisters secondary to suspected allergic reaction to CHG wipes vs DVT  Per patient similar thing happened at [facility] after being exposed to those wipes - RUE [place] to r/o DVT ordered, patient on heparin drip - solu-medrol 0.5 mg/kg IV ordered to assist with possible allergic rxn - benadryl PRN  Best Practice (right click and "Reselect all SmartList Selections" daily)  Diet/type: Regular consistency (see orders) DVT prophylaxis: systemic heparin GI prophylaxis: PPI Lines: N/A Foley:  Yes, and it is still needed Code Status:  full code Last date of multidisciplinary goals of care discussion [per primary]  Labs   CBC: Recent Labs  Lab 09/17/21 0050 09/17/21 1605 09/18/21 0007 09/18/21 0817 09/19/21 1230  WBC 16.7* 16.4* 16.8* 21.1* 24.4*  NEUTROABS 13.6*  --   --   --   --   HGB 7.7* 7.3* 7.5* 8.1* 7.8*  HCT 26.5* 25.0* 25.9* 27.7* 25.8*  MCV 75.1* 73.3* 74.4* 73.7* 72.3*  PLT 274 268  274 284 720    Basic Metabolic Panel: Recent Labs  Lab 09/15/21 1328 09/18/21 0007 09/19/21 0506  NA 133* 135 133*  K 4.5 4.1 3.8  CL 99 99 98  CO2 21* 27 24  GLUCOSE 121* 110* 110*  BUN 16 20 21*  CREATININE 0.62 0.78 0.84  CALCIUM 8.8* 8.0* 8.0*  MG  --  1.6* 1.8   GFR: Estimated  Creatinine Clearance: 131.9 mL/min (by C-G formula based on SCr of 0.84 mg/dL). Recent Labs  Lab 09/17/21 0050 09/17/21 0606 09/17/21 1022 09/17/21 1605 09/18/21 0007 09/18/21 0817 09/19/21 1230  PROCALCITON  --   --  0.34  --   --   --   --   WBC 16.7*  --   --  16.4* 16.8* 21.1* 24.4*  LATICACIDVEN 0.9 1.4  --   --   --   --   --     Liver Function Tests: No results for input(s): AST, ALT, ALKPHOS, BILITOT, PROT, ALBUMIN in the last 168 hours. No results for input(s): LIPASE, AMYLASE in the last 168 hours. No results for input(s): AMMONIA in the last 168 hours.  ABG No results found for: PHART, PCO2ART, PO2ART, HCO3, TCO2, ACIDBASEDEF, O2SAT   Coagulation Profile: Recent Labs  Lab 09/17/21 1022  INR 1.5*    Cardiac Enzymes: No results for input(s): CKTOTAL, CKMB, CKMBINDEX, TROPONINI in the last 168 hours.  HbA1C: No results found for: HGBA1C  CBG: No results for input(s): GLUCAP in the last 168 hours.  Review of Systems: positives in BOLD  Gen: Denies fever, chills, weight change, fatigue, night sweats HEENT: Denies blurred vision, double vision, hearing loss, tinnitus, sinus congestion, rhinorrhea, sore throat, neck stiffness, dysphagia PULM: Denies shortness of breath, cough, sputum production, hemoptysis, wheezing CV: Denies chest pain, edema, orthopnea, paroxysmal nocturnal dyspnea, palpitations GI: Denies abdominal pain, nausea, vomiting, diarrhea, hematochezia, melena, constipation, change in bowel habits GU: Denies dysuria, hematuria, polyuria, oliguria, urethral discharge Endocrine: Denies hot or cold intolerance, polyuria, polyphagia or appetite change Derm: Denies rash, swelling, pain, dry skin, scaling or peeling skin change Heme: Denies easy bruising, bleeding, bleeding gums Neuro: Denies headache, numbness, weakness, slurred speech, loss of memory or consciousness  Past Medical History:  She,  has a past medical history of Anemia, unspecified and  Hypertension.   Surgical History:   Past Surgical History:  Procedure Laterality Date   DILATION AND CURETTAGE OF UTERUS       Social History:   reports that she has never smoked. She has never used smokeless tobacco. She reports that she does not drink alcohol and does not use drugs.   Family History:  Her family history includes Diabetes Mellitus I in her father; Hypertension in her father and mother.   Allergies No Known Allergies   Home Medications  Prior to Admission medications   Medication Sig Start Date End Date Taking? Authorizing Provider  enoxaparin (LOVENOX) 120 MG/0.8ML injection Inject 120 mg into the skin daily. 09/01/21  Yes [provider]  ferrous sulfate 325 (65 FE) MG EC tablet Take 1 tablet (325 mg total) by mouth 3 (three) times daily with meals. 07/21/21 07/21/22 Yes Lorella Nimrod, MD  furosemide (LASIX) 40 MG tablet Take 40 mg by mouth daily. 07/31/21  Yes [provider]  magnesium oxide (MAG-OX) 400 (240 Mg) MG tablet Take 1 tablet by mouth daily. 06/17/21  Yes [provider]  medroxyPROGESTERone (PROVERA) 10 MG tablet Take 10 mg by mouth in the morning and  at bedtime. 07/12/21  Yes [provider]  pantoprazole (PROTONIX) 40 MG tablet Take 40 mg by mouth daily. 09/01/21  Yes [provider]  torsemide (DEMADEX) 20 MG tablet Take 20 mg by mouth daily. 09/01/21  Yes [provider]  vitamin B-12 (CYANOCOBALAMIN) 1000 MCG tablet Take by mouth. 07/12/21  Yes [provider]     Critical care time: 45 minutes     Venetia Night, AGACNP-BC Acute Care Nurse Practitioner [facility]   [phone] / [phone] Please see Amion for pager details.

## 2021-09-20 NOTE — Progress Notes (Signed)
Vital signs reviewed, ICU needs resolved CASE DISCUSSED WITH DR Roosevelt Locks  Will sign off at this time. No further recommendations at this time.      Corrin Parker, M.D.  [facility]  Medical Director [place] Director [facility] Cardio-Pulmonary Department

## 2021-09-20 NOTE — Progress Notes (Signed)
An USGPIV (ultrasound guided PIV) has been placed for short-term vasopressor infusion. A correctly placed ivWatch must be used when administering Vasopressors. Should this treatment be needed beyond 72 hours, central line access should be obtained.  It will be the responsibility of the bedside nurse to follow best practice to prevent extravasations.   ?

## 2021-09-20 NOTE — Progress Notes (Signed)
Physical Therapy Discharge Patient Details Name: Krystal Francis MRN: [id number] DOB: 1960-07-16 Today's Date: 09/20/2021 Time:  -     Patient discharged from PT services secondary to medical decline - will need to re-order PT to resume therapy services.  Pt transferred to ICU and not appropriate for services at this time.  Please see latest therapy progress note for current level of functioning and progress toward goals.    Progress and discharge plan discussed with primary MD.  GP     Chesley Noon 09/20/2021, 8:19 AM

## 2021-09-20 NOTE — Progress Notes (Signed)
Nutrition Follow-up  DOCUMENTATION CODES:   Morbid obesity  INTERVENTION:   -MVI with minerals daily -Ensure Enlive po BID, each supplement provides 350 kcal and 20 grams of protein  NUTRITION DIAGNOSIS:   Increased nutrient needs related to acute illness as evidenced by estimated needs.  Ongoing  GOAL:   Patient will meet greater than or equal to 90% of their needs  Progressing   MONITOR:   PO intake, Supplement acceptance  REASON FOR ASSESSMENT:   Malnutrition Screening Tool    ASSESSMENT:   Pt with medical history significant of RV failure, hypertension, GERD, PE on Lovenox, iron deficiency anemia, urinary retention with Foley catheter placement, uterine bleeding, morbid obesity with BMI 78.27, who presents with shortness of breath.  Reviewed I/O's: -274 ml x 24 hours and -5.1 L since admission  UOP: 2.1 L x 24 hours   Pt resting quietly at time of visit. She did not arouse to voice. Noted opened Ensure supplement on tray table.   Intake has improved. Noted meal completions 20-100%.   Case discussed with RN, MD, and during ICU rounds. Pt has been in and out of [facility] secondary to vaginal bleeding and has had insufficient care in the home. Pt with multiple blisters where her skin is peeling.   Pt with PE in rt arm, currently on heparin drip.   Pt dislikes to be touched or moved; pt unlikely to be able to return home. Palliative care consult pending.   Medications reviewed and include solu-medrol, magnesium oxide, potassium chloride, and senokot.   Labs reviewed: K: 3.4, CBGS: 131 (inpatient orders for glycemic control are none).    NUTRITION - FOCUSED PHYSICAL EXAM:  Flowsheet Row Most Recent Value  Orbital Region No depletion  Upper Arm Region No depletion  Thoracic and Lumbar Region No depletion  Buccal Region No depletion  Temple Region No depletion  Clavicle Bone Region No depletion  Clavicle and Acromion Bone Region No depletion  Scapular Bone  Region No depletion  Dorsal Hand No depletion  Patellar Region No depletion  Anterior Thigh Region No depletion  Posterior Calf Region No depletion  Edema (RD Assessment) Moderate  Hair Reviewed  Eyes Reviewed  Mouth Reviewed  Skin Reviewed  Nails Reviewed       Diet Order:   Diet Order             Diet 2 gram sodium Room service appropriate? Yes; Fluid consistency: Thin  Diet effective now                   EDUCATION NEEDS:   No education needs have been identified at this time  Skin:  Skin Assessment: Reviewed RN Assessment  Last BM:  09/17/21  Height:   Ht Readings from Last 1 Encounters:  09/17/21 '5\' 4"'$  (1.626 m)    Weight:   Wt Readings from Last 1 Encounters:  09/20/21 (!) 207 kg    Ideal Body Weight:  54.5 kg  BMI:  Body mass index is 78.33 kg/m.  Estimated Nutritional Needs:   Kcal:  1900-2100  Protein:  120-135 grams  Fluid:  > 1.9 L    Loistine Chance, RD, LDN, [place] Registered Dietitian II Certified Diabetes Care and Education Specialist Please refer to [facility] for RD and/or RD on-call/weekend/after hours pager

## 2021-09-20 NOTE — Consult Note (Signed)
Pharmacy Antibiotic Note  Krystal Francis is a 61 y.o. female w/ h/o RV failure, HTN, GERD, PE PTA on Lovenox, iron deficiency anemia, urinary retention with Foley catheter placement, uterine bleeding, morbid obesity with BMI 78.27, who presents with shortness of breath > w/u c/w PE & multifocal PNA. Pt admitted on 09/15/2021 with pneumonia and bacteremia & BCID resulted with MRSA 4 of 4 vials on 5/20.  Pharmacy has been consulted for escalation of abx to Vancomycin dosing.  Today, 09/20/2021 Day # 3 vancomycin MRSA bacteremia Renal: SCr stable WBC 24.4 increased Repeat blood cx pending Vancomycin levels 5/22 on 1gm IV q12h (with 5th maintenance dose Dose given at ___ Vancomycin peak at 2130 = ___mcg/mL Vancomycin trough 5/23 at 0530 = ___ mcg/mL  Plan: -- Continue vancomycin 1.5g q12h (5/20 1900>> Goal AUC 400-550  Est AUC: 494.5; Cmax: 28.7; Cmin: 15.5 SCr 0.8 (rounded from 0.62; IBW; Vd 0.5 --check vancomycin levels tonight    5/22:  Vanc peak @ 2141 = 39   5/23:  Vanc trough @ 0504 = 30              - Calculated AUC = 848   Will adjust dose to Vancomycin 1750 mg to start on 5/23 @ 1700.          - estimated AUC:  494.6             Vanc peak 5/25 @ 2000            Vanc trough 5/26 @ 1630   Height: '5\' 4"'$  (162.6 cm) Weight: (!) 207 kg (456 lb 5.6 oz) IBW/kg (Calculated) : 54.7  Temp (24hrs), Avg:98.5 F (36.9 C), Min:97.8 F (36.6 C), Max:98.8 F (37.1 C)  Recent Labs  Lab 09/15/21 1328 09/17/21 0050 09/17/21 0606 09/17/21 1605 09/18/21 0007 09/18/21 0817 09/19/21 0506 09/19/21 1230 09/19/21 2141 09/20/21 0504  WBC 15.0* 16.7*  --  16.4* 16.8* 21.1*  --  24.4*  --  19.7*  CREATININE 0.62  --   --   --  0.78  --  0.84  --   --  0.73  LATICACIDVEN  --  0.9 1.4  --   --   --   --   --   --  1.5  VANCOTROUGH  --   --   --   --   --   --   --   --   --  30*  VANCOPEAK  --   --   --   --   --   --   --   --  39  --      Estimated Creatinine Clearance: 138.2 mL/min (by  C-G formula based on SCr of 0.73 mg/dL).    Allergies  Allergen Reactions   Chlorhexidine Hives, Dermatitis, Rash and Other (See Comments)    Blisters right after CHG wipe down    Antimicrobials this admission: VAN (5/20 >>  Azithro (5/20-5/21) CRO x1 in ED (5/20)  Dose adjustments this admission: CTM and adjust PRN  Microbiology results: 5/20 BCx: BCID 4 of 4 vials MRSA 5/20 Sputum: GPCs and GvarR  5/20 Flu/Cov PCR: negative  Thank you for allowing pharmacy to be a part of this patient's care.  Doreene Eland, PharmD, BCPS, BCIDP Work Cell: [phone] 09/20/2021 6:22 AM

## 2021-09-20 NOTE — Progress Notes (Signed)
Date of Admission:  09/15/2021     ID: Krystal Francis is a 61 y.o. female Principal Problem:   Multifocal pneumonia Active Problems:   Morbid obesity with body mass index of 70 and over in adult [facility])   HTN (hypertension)   Severe sepsis (HCC)   Hematuria   Iron deficiency anemia   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[facility])   Pulmonary embolism (HCC)   Urinary retention   MRSA bacteremia   Hypomagnesemia   Acute hypoxemic respiratory failure (HCC)   Arterial hypotension    Subjective: Patient in ICU because of soft BP.  Patient has developed a rash severe on the right but also on the chest and she says it is due to CHG wipes. She had a similar reaction when she was stopped [facility]. There was a ? about furosemide at [facility]   Medications:   feeding supplement  237 mL Oral BID BM   guaiFENesin  600 mg Oral BID   ipratropium-albuterol  3 mL Nebulization BID   iron polysaccharides  150 mg Oral Daily   magnesium oxide  400 mg Oral Daily   medroxyPROGESTERone  10 mg Oral BID   methylPREDNISolone (SOLU-MEDROL) injection  20 mg Intravenous Q12H   multivitamin with minerals  1 tablet Oral Daily   pantoprazole  40 mg Oral Daily   potassium chloride  40 mEq Oral Once   senna-docusate  2 tablet Oral BID    Objective: Vital signs in last 24 hours: Temp:  [97.9 F (36.6 C)-98.8 F (37.1 C)] 97.9 F (36.6 C) (05/23 0800) Pulse Rate:  [75-110] 90 (05/23 1115) Resp:  [15-28] 15 (05/23 0315) BP: (80-115)/(26-62) 102/50 (05/23 1115) SpO2:  [88 %-99 %] 92 % (05/23 1115) Weight:  [207 kg] 207 kg (05/23 0429)   PHYSICAL EXAM:  General: Alert, cooperative, some distress, appears stated age.  Lungs: b/l air entry  Heart: tachycardia Abdomen: Soft, non-tender,not distended. Bowel sounds normal. No masses Extremities: rt upper extremity swollen, peeling skin, background erythema With exanthematous pustulosis Bilateral lymphedema of the legs. Induration on the left thigh  Skin Acute pustular  lesions     Lymph: Cervical, supraclavicular normal. Neurologic: Grossly non-focal  Lab Results Recent Labs    09/19/21 0506 09/19/21 1230 09/20/21 0504  WBC  --  24.4* 19.7*  HGB  --  7.8* 7.3*  HCT  --  25.8* 24.2*  NA 133*  --  135  K 3.8  --  3.4*  CL 98  --  99  CO2 24  --  26  BUN 21*  --  24*  CREATININE 0.84  --  0.73    Microbiology:  Studies/Results: US Venous Img Upper Uni Right(DVT)  Result Date: 09/20/2021 CLINICAL DATA:  Right arm edema. EXAM: RIGHT UPPER EXTREMITY VENOUS DOPPLER ULTRASOUND TECHNIQUE: Gray-scale sonography with graded compression, as well as color Doppler and duplex ultrasound were performed to evaluate the upper extremity deep venous system from the level of the subclavian vein and including the jugular, axillary, basilic, radial, ulnar and upper cephalic vein. Spectral Doppler was utilized to evaluate flow at rest and with distal augmentation maneuvers. COMPARISON:  None Available. FINDINGS: Contralateral Subclavian Vein: Respiratory phasicity is normal and symmetric with the symptomatic side. No evidence of thrombus. Normal compressibility. Internal Jugular Vein: Occlusive thrombus. Subclavian Vein: Occlusive thrombus. Axillary Vein: Occlusive thrombus. Cephalic Vein: Proximal occlusive thrombus. Basilic Vein: Nearly occlusive thrombus with only trace residual flow evident. Brachial Veins: Occlusive thrombus. Radial Veins: Occlusive thrombus. Ulnar Veins: Diminutive  but patent. IMPRESSION: 1. Extensive occlusive thrombus in the right upper extremity as above. These results will be called to the ordering clinician or representative by the Radiologist Assistant, and communication documented in the PACS or [employer]. Electronically Signed   By: Misty Stanley M.D.   On: 09/20/2021 07:18   US ARTERIAL LOWER EXTREMITY DUPLEX LEFT (NON-ABI)  Result Date: 09/19/2021 CLINICAL DATA:  Left lower extremity pain.  Evaluate for PAD. EXAM: LEFT LOWER  EXTREMITY ARTERIAL DUPLEX SCAN TECHNIQUE: Gray-scale sonography as well as color Doppler and duplex ultrasound was performed to evaluate the lower extremity arteries including the common, superficial and profunda femoral arteries, popliteal artery and calf arteries. COMPARISON:  None Available. FINDINGS: Left lower Extremity: Left Common femoral artery: Triphasic waveform; 201 cm/sec Normal waveform is demonstrated within the left common femoral artery however there is borderline elevated peak systolic velocity of 536 cm/sec, findings suggestive of potential inflow (aortoiliac disease). Left superficial femoral artery: Biphasic waveform Proximal: 109 cm/sec Mid: 118 cm/sec Distal: 81 cm/sec Left deep femoral artery: Biphasic waveform; 90 cm/sec Left popliteal artery: Biphasic waveform; 78 cm/sec Biphasic waveforms are demonstrated throughout the interrogated portions of the left superficial, and deep femoral arteries as well as the popliteal artery. Velocity differences between the mid and distal aspects of the left superficial femoral artery suggests hemodynamically significant narrowing regional to this location. Left anterior tibial artery: Monophasic waveform; 18 cm/sec Left posterior tibial artery: Biphasic waveform; 97 cm/sec Tibial runoff is patent the level of the lower leg however blunted monophasic waveform is demonstrated within the left anterior tibial artery with associated markedly reduced velocity suggestive of atretic flow. Biphasic waveform is demonstrated within the left posterior tibial artery. IMPRESSION: Suspected hemodynamically significant narrowings involving inflow, outflow and tibial runoff of the left lower extremity. Further evaluation with CTA run-off could be performed as indicated. Electronically Signed   By: Sandi Mariscal M.D.   On: 09/19/2021 10:36     Assessment/Plan: MRSA bacteremia.  Patient was recently in [place] for 4 weeks and a central line until 09/01/2021.  She has bilateral  pulmonary infiltrates and MRSA positive in the nares which makes MRSA pneumonia highly likely.  The other differential is septic emboli to the lungs from vegetation.  Need to rule out infective endocarditis of tricuspid valve.  Continue vancomycin.  Will need TEE to rule out endocarditis. Repeat blood cultures positive.  Will need to check blood until it is clear of bacteremia   Severe edema of the right upper extremity.  Due to DVT ultrasound shows extensive occlusive thrombus involving internal jugular, subclavian, axillary, cephalic vein, basilic vein, brachial vein and radial vein. Leukocytosis could be due to this The persistent bacteremia could be also due to Buerger's.   AGEP-looks like a drug reaction.  The patient states it is due to chlorhexidine wipes which she had not [facility] as well.  Need to watch for  furosemide  Anemia with history of recent vaginal bleeding she has been on Provera.  Needed multiple PRBCs in March 2023  Recent PE with right strain.  Patient has Foley for self convenience.  Would recommend pure wick.  Lymphedema legs left worse than right with no element of cellulitis.  Discussed the management with the patient and the care team.

## 2021-09-20 NOTE — Consult Note (Signed)
[facility] for IV Heparin Indication: pulmonary embolus  Patient Measurements: Height: '5\' 4"'$  (162.6 cm) Weight: (!) 207 kg (456 lb 5.6 oz) IBW/kg (Calculated) : 54.7 Heparin Dosing Weight: 109 kg  Labs: Recent Labs    09/18/21 0007 09/18/21 0817 09/18/21 1448 09/19/21 0506 09/19/21 1230 09/19/21 2141 09/20/21 0504 09/20/21 1007  HGB 7.5* 8.1*  --   --  7.8*  --  7.3*  --   HCT 25.9* 27.7*  --   --  25.8*  --  24.2*  --   PLT 274 284  --   --  312  --  304  --   HEPARINUNFRC 0.17* 0.33   < > 0.28* 0.26* 0.36  --  0.39  CREATININE 0.78  --   --  0.84  --   --  0.73  --    < > = values in this interval not displayed.    Estimated Creatinine Clearance: 138.2 mL/min (by C-G formula based on SCr of 0.73 mg/dL).  Medical History: Past Medical History:  Diagnosis Date   Anemia, unspecified    Hypertension    Assessment: 61yo F w/ h/o RV failure, HTN, GERD, PE PTA on Lovenox, iron deficiency anemia, urinary retention with foley catheter placement, uterine bleeding, morbid obesity with BMI 78.27, who presents with shortness of breath. Pharmacy consulted to start heparin for VTE. Pt was diagnosis in April 2023 per MD. Pt was discharge from [facility] on Enoxaparin 120 mg BID. Last anti-xa level was 0.75 (therapeutic). Pt has some hematuria so switching enoxaparin to heparin.   Note: PTA - pt received enoxaparin 120 mg x 1 5/19 at 2145.   Date Time  HL Rate/Comment 5/20 1600 0.13 Subthera; 1700>2100 un/hr  5/21 0007 0.17 Subthera; 2100>2400 un/hr  5/21 0817 0.33 Thera x1; 2400 un/hr 5/21 1448 0.26 Subthera; 2400>2600 un/hr 5/21 2156 0.32 Thera x 1; 2600 un/hr 5/22 0506 0.28 Subthera; 2600>2750 un/hr 5/22 1230 0.26 Subthera; 2750>2900 un/hr 5/22     2141    0.36    Thera x 1; 2900 un/hr 5/22 1007 0.39 Thera x 2; 2900 un/hr  Baseline Labs: aPTT - 38s INR - 1.5 Hgb -8.1>7.8>7.3 Plts - 268>284>312>304  Goal of Therapy:  Heparin level 0.3-0.7  units/ml Monitor platelets by anticoagulation protocol: Yes   Plan:  --Heparin level is therapeutic x 2 --Continue heparin infusion at 2900 units/hr --Re-check HL tomorrow AM --Daily CBC per protocol while on IV heparin  Benita Gutter 09/20/2021 11:40 AM

## 2021-09-20 NOTE — Progress Notes (Signed)
       CROSS COVER NOTE  NAME: Krystal Francis MRN: [id number] DOB : Jun 15, 1960    Date of Service   09/19/21  HPI/Events of Note   Secure chat received from nursing "Krystal Francis's MAP is 42. She is alert and oriented. Day shift said the MD wanted to give her albumin if her MAP was below 60. MD states BP is low due to Right sided Heart Failure"  Krystal Francis is a 61 yo F with PMH of RV failure, hypertension, GERD, PE, IDA, urinary retention, uterine bleeding, and morbid obesity and is currently being treated for Sepsis secondary to multifocal pneumonia and MRSA bacteremia. She has had ongoing hypotension despite 25g albumin given today.  On arrival to bedside patient is alert and oriented x4. She has warm extremities and palpable pulses +2 radial/+2 pedal. She reports pain in her RUE which is red and swollen. She also has RUE blisters that staff report first appeared after the patient was wiped down with CHG wipes this morning.   Interventions   Plan: Albumin--> 94/37 MAP 55 PO midodrine--> BP 97/31 MAP 47 Levophed to maintain MAP >60,SBP >90       Transfer to stepdown, PCCM consulted Lactic acid and procalcitonin added to AM labs      Krystal Francis, MHA, FNP-BC Nurse Practitioner [facility] [place] Pager [phone]   Hypotensive ongoing  Albumin, midodrine  Levophed.Marland KitchenMAP 47

## 2021-09-20 NOTE — Progress Notes (Addendum)
Patient transferrred to unit for hypotension She's alert & oriented x 4. C/O pain to left leg 10/10. Afebrile. BP soft with a map <65. NS infusing @ 125 mls /hr. Skin assessment completed.  Patient stated she has an allergic reaction to CHG wipes on the right side of her body. A rash noted on right side of body from the neck to her right lower leg. Right arm swollen, weeping with blisters. Left inner thigh weeping, abrasions noted to right lower buttocks x 2, blisters scattered throughout body. MASD noted in abdominal, breast & thigh folds. Skin peeling off on bilateral feet.Blood clots coming from vagina. Chronic foley in place draining yellow, cloudy  urine. Patient crying out in pain. Pain medication administered as ordered. Britton-Lee, NP aware.

## 2021-09-20 NOTE — Hospital Course (Addendum)
Krystal Francis is a 61 y.o. female with medical history significant of RV failure, hypertension, GERD, PE on Lovenox, iron deficiency anemia, urinary retention with Foley catheter placement, uterine bleeding, morbid obesity with BMI 78.27, who presents with shortness of breath. Patient had a significant tachycardia, tachypnea, leukocytosis and hypoxemia with oxygen saturation 87%, she met severe sepsis criteria. Her blood culture positive for MRSA, CT angiogram showed multifocal pneumonia with evidence of pulmonary hypertension. She was seen by ID, vancomycin was started. Extensive occlusive thrombus RUE notes 05/23 on [place], vascular consulted. Thrombectomy done 05/31.  Patient was continued on Lovenox injection.

## 2021-09-20 NOTE — Progress Notes (Signed)
Progress Note   Patient: Krystal Francis BHA:[id number] DOB: Mar 16, 1961 DOA: 09/15/2021     3 DOS: the patient was seen and examined on 09/20/2021   Brief hospital course: Krystal Francis is a 61 y.o. female with medical history significant of RV failure, hypertension, GERD, PE on Lovenox, iron deficiency anemia, urinary retention with Foley catheter placement, uterine bleeding, morbid obesity with BMI 78.27, who presents with shortness of breath. Patient had a significant tachycardia, tachypnea, leukocytosis and hypoxemia with oxygen saturation 87%, she met severe sepsis criteria. Her blood culture positive for MRSA, CT angiogram showed multifocal pneumonia with evidence of pulmonary hypertension. She was seen by ID, vancomycin was started. Patient also has a severe right-sided systolic dysfunction, she developed hypotension on 5/22, she was given albumin, she was started on midodrine.  Repeated blood culture on 5/22 still positive for staph.  Assessment and Plan: Severe sepsis secondary to multifocal pneumonia. Multifocal pneumonia secondary to MRSA. MRSA septicemia. Acute hypoxemic respiratory failure secondary to pneumonia. Ruled in, Krystal Francis Patient developed hypotension again yesterday, this is a partially due to severe right-sided congestive heart failure.  She is giving albumin, midodrine.  She was briefly treated in ICU.  Blood pressure better today, transfer patient back to PCU. Repeated blood culture drawn on 5/22 still positive for staph.  Patient had been evaluated by ID.  TTE is ordered, pending results.  Patient will need a TEE if TTE did not show significant vegetation.  Due to persistent bacteremia, patient has a high probability of bacterial endocarditis.   Recent pulmonary emboli. Acute blood loss anemia with iron deficient anemia. Gross hematuria secondary to indwelling Foley catheter. Patient hemoglobin has been low, but no additional hematuria. Due to severe right-sided congestive  heart failure, she will be receiving 1 unit of PRBC. Patient was previously on Lovenox injection, due to acute bleeding, transition to heparin drip.  At this point, I will continue heparin drip for now.   Hypomagnesemia. Hyponatremia Hypokalemia. Magnesium has normalized, continue replete potassium.  Morbid obesity with BMI 78.27. Chronic right-sided congestive heart failure. Hypotension. Continue to follow.  Patient will be receiving 20 mg of Lasix after blood transfusion.  Assess daily for diuretic use.   Left leg severe peripheral arterial disease. Patient has leg pain at rest, duplex ultrasound showed severe peripheral vascular disease.  Patient is a seen by vascular surgery, no surgery is planned due to bacteremia.  Currently, patient be receiving pain medicine for symptomic control.  Continue heparin drip.    Right arm swelling. Patient had allergic reaction resulting in right-sided body redness, this happened after skin wipes.        Subjective:  Patient still has some short of breath with exertion.  Severe weakness. No abdominal pain or nausea vomiting.  Physical Exam: Vitals:   09/20/21 1100 09/20/21 1115 09/20/21 1200 09/20/21 1400  BP: (!) 108/48 (!) 102/50 (!) 105/59 (!) 109/54  Pulse: 99 90 91 (!) 102  Resp:      Temp:      TempSrc:      SpO2: 90% 92% 95% 98%  Weight:      Height:       General exam: Appears calm and comfortable, severely morbid obese. Respiratory system: Breath sounds bilaterally. Respiratory effort normal. Cardiovascular system: S1 & S2 heard, RRR. No JVD, murmurs, rubs, gallops or clicks. No pedal edema. Gastrointestinal system: Abdomen is nondistended, soft and nontender. No organomegaly or masses felt. Normal bowel sounds heard. Central nervous system: Alert and oriented. No focal  neurological deficits. Extremities: Right arm swelling with redness Skin: Right-sided body skin redness Psychiatry: Judgement and insight appear normal. Mood  & affect appropriate.   Data Reviewed:  Lab results reviewed.  Family Communication: Sister updated.  Disposition: Status is: Inpatient Remains inpatient appropriate because: Severity of disease, IV antibiotics.  Planned Discharge Destination: Skilled nursing facility    Time spent: 55 minutes  Author: Sharen Hones, MD 09/20/2021 3:19 PM  For on call review [email or web address].

## 2021-09-20 NOTE — Progress Notes (Signed)
OT Cancellation Note  Patient Details Name: Krystal Francis MRN: [id number] DOB: 21-Sep-1960   Cancelled Treatment:    Reason Eval/Treat Not Completed: Patient not medically ready. Chart reviewed. Pt noted with transfer to ICU for hypotension. Per secure chat with MD, therapy to sign off for now and will be re-consulted once pt is medically appropriate for participation.   Ardeth Perfect., MPH, MS, OTR/L ascom [phone] 09/20/21, 8:20 AM

## 2021-09-21 ENCOUNTER — Inpatient Hospital Stay (HOSPITAL_COMMUNITY)
Admit: 2021-09-21 | Discharge: 2021-09-21 | Disposition: A | Discharge status: Home / Self Care | Payer: Medicare Other | Attending: Internal Medicine | Admitting: Internal Medicine

## 2021-09-21 DIAGNOSIS — Z515 Encounter for palliative care: Secondary | ICD-10-CM | POA: Diagnosis not present

## 2021-09-21 DIAGNOSIS — I38 Endocarditis, valve unspecified: Secondary | ICD-10-CM

## 2021-09-21 DIAGNOSIS — A419 Sepsis, unspecified organism: Secondary | ICD-10-CM | POA: Diagnosis not present

## 2021-09-21 DIAGNOSIS — Z7189 Other specified counseling: Secondary | ICD-10-CM | POA: Diagnosis not present

## 2021-09-21 DIAGNOSIS — J189 Pneumonia, unspecified organism: Secondary | ICD-10-CM | POA: Diagnosis not present

## 2021-09-21 LAB — ECHOCARDIOGRAM COMPLETE
Height: 64 in
S' Lateral: 3.1 cm
Weight: 7343.96 oz

## 2021-09-21 LAB — BPAM RBC
Blood Product Expiration Date: 202306242359
ISSUE DATE / TIME: 202305232151
Unit Type and Rh: 5100

## 2021-09-21 LAB — CULTURE, RESPIRATORY W GRAM STAIN

## 2021-09-21 LAB — CBC
HCT: 26.4 % — ABNORMAL LOW (ref 36.0–46.0)
Hemoglobin: 7.9 g/dL — ABNORMAL LOW (ref 12.0–15.0)
MCH: 22.1 pg — ABNORMAL LOW (ref 26.0–34.0)
MCHC: 29.9 g/dL — ABNORMAL LOW (ref 30.0–36.0)
MCV: 73.7 fL — ABNORMAL LOW (ref 80.0–100.0)
Platelets: 302 10*3/uL (ref 150–400)
RBC: 3.58 MIL/uL — ABNORMAL LOW (ref 3.87–5.11)
RDW: 21.5 % — ABNORMAL HIGH (ref 11.5–15.5)
WBC: 17.5 10*3/uL — ABNORMAL HIGH (ref 4.0–10.5)
nRBC: 0.5 % — ABNORMAL HIGH (ref 0.0–0.2)

## 2021-09-21 LAB — TYPE AND SCREEN
ABO/RH(D): O POS
Antibody Screen: NEGATIVE
Unit division: 0

## 2021-09-21 LAB — BASIC METABOLIC PANEL
Anion gap: 9 (ref 5–15)
BUN: 23 mg/dL — ABNORMAL HIGH (ref 6–20)
CO2: 27 mmol/L (ref 22–32)
Calcium: 8.5 mg/dL — ABNORMAL LOW (ref 8.9–10.3)
Chloride: 100 mmol/L (ref 98–111)
Creatinine, Ser: 0.66 mg/dL (ref 0.44–1.00)
GFR, Estimated: >60 mL/min (ref >60)
Glucose, Bld: 178 mg/dL — ABNORMAL HIGH (ref 70–99)
Potassium: 3.8 mmol/L (ref 3.5–5.1)
Sodium: 136 mmol/L (ref 135–145)

## 2021-09-21 LAB — MAGNESIUM: Magnesium: 1.8 mg/dL (ref 1.7–2.4)

## 2021-09-21 LAB — HEPARIN LEVEL (UNFRACTIONATED): Heparin Unfractionated: 0.23 IU/mL — ABNORMAL LOW (ref 0.30–0.70)

## 2021-09-21 LAB — VANCOMYCIN, RANDOM: Vancomycin Rm: 15

## 2021-09-21 MED ORDER — ENOXAPARIN SODIUM 120 MG/0.8ML IJ SOSY
120.0000 mg | PREFILLED_SYRINGE | Freq: Two times a day (BID) | INTRAMUSCULAR | Status: DC
  Administered 2021-09-21 – 2021-10-13 (×44): 120 mg via SUBCUTANEOUS
  Filled 2021-09-21 (×47): qty 0.8

## 2021-09-21 MED ORDER — HEPARIN BOLUS VIA INFUSION
2000.0000 [IU] | Freq: Once | INTRAVENOUS | Status: AC
  Administered 2021-09-21: 2000 [IU] via INTRAVENOUS
  Filled 2021-09-21: qty 2000

## 2021-09-21 MED ORDER — SILVER SULFADIAZINE 1 % EX CREA
TOPICAL_CREAM | Freq: Two times a day (BID) | CUTANEOUS | Status: DC
  Filled 2021-09-21 (×3): qty 85

## 2021-09-21 NOTE — Progress Notes (Signed)
  Progress Note   Patient: Krystal Francis ZPH:[id number] DOB: [birth year] DOA: 09/15/2021     4 DOS: the patient was seen and examined on 09/21/2021   Brief hospital course: Krystal Francis is a 61 y.o. female with medical history significant of RV failure, hypertension, GERD, PE on Lovenox, iron deficiency anemia, urinary retention with Foley catheter placement, uterine bleeding, morbid obesity with BMI 78.27, who presents with shortness of breath. Patient had a significant tachycardia, tachypnea, leukocytosis and hypoxemia with oxygen saturation 87%, she met severe sepsis criteria. Her blood culture positive for MRSA, CT angiogram showed multifocal pneumonia with evidence of pulmonary hypertension. She was seen by ID, vancomycin was started. Patient also has a severe right-sided systolic dysfunction, she developed hypotension on 5/22, she was given albumin, she was started on midodrine.  Repeated blood culture on 5/22 still positive for staph.  Assessment and Plan: Severe sepsis secondary to multifocal pneumonia. Multifocal pneumonia secondary to MRSA. MRSA septicemia. Acute hypoxemic respiratory failure secondary to pneumonia. Ruled in, POA Repeated blood culture drawn on 5/22 still positive for staph.   --ID consulted --TTE no mention of thrombus Plan: --will consult cardio tomorrow for TEE --cont IV vanc  Hypotension  --partially due to severe right-sided congestive heart failure.  She was giving albumin, midodrine.  She was briefly treated in ICU, was started on IV solumedrol.  Blood pressure better, transfer patient back to PCU on 5/23. --d/c solumedrol  Recent pulmonary emboli --on tx dose Lovenox PTA, and switched to heparin gtt while inpatient due to concern of hematuria, which has resolved. Plan: --transition back to home lovenox  Acute blood loss anemia with iron deficient anemia. Gross hematuria secondary to indwelling Foley catheter. Patient hemoglobin has been low, but no  additional hematuria. Due to severe right-sided congestive heart failure, pt received 1 unit of PRBC. Plan: --cont iron suppl --monitor Hgb   Hypomagnesemia. Hypokalemia. --monitor and replete PRN  Hyponatremia  Morbid obesity with BMI 78.27.  Chronic right-sided congestive heart failure. Assess daily for diuretic needs.   Left leg severe peripheral arterial disease. Patient has leg pain at rest, duplex ultrasound showed severe peripheral vascular disease.  Patient is a seen by vascular surgery, no surgery is planned due to bacteremia.   --Norco PRN for pain --d/c IV dilaudid  Right arm swelling 2/2 RUE DVT --cont home lovenox  Contact dermatitis  Was wiped down with CHG wipes per Infectious disease protocol and developed blistering to right arm and elbow on both upper arm and forearm. --wound care per order       Subjective:  Pt was frustrated with all the issues going on with her.     Physical Exam:  Constitutional: NAD, AAOx3 HEENT: conjunctivae and lids normal, EOMI CV: No cyanosis.   RESP: normal respiratory effort SKIN: right arm edematous with blistering and peeling skin Neuro: II - XII grossly intact.   Psych: annoyed mood and affect.     Data Reviewed:  Lab results reviewed.  Family Communication:  Disposition: Status is: Inpatient Remains inpatient appropriate because: Severity of disease, IV antibiotics.  Planned Discharge Destination: Skilled nursing facility    Time spent: 50 minutes  Author: Enzo Bi, MD 09/21/2021 5:27 PM  For on call review [email or web address].

## 2021-09-21 NOTE — Progress Notes (Signed)
*  PRELIMINARY RESULTS* Echocardiogram 2D Echocardiogram has been performed.  Sherrie Sport 09/21/2021, 7:40 AM

## 2021-09-21 NOTE — Consult Note (Signed)
Palliative Care Consult Note                                  Date: 09/21/2021   Patient Name: Krystal Francis  DOB: 1961-03-01  MRN: [id number]  Age / Sex: 61 y.o., female  PCP: Latanya Maudlin, NP Referring Physician: Enzo Bi, MD  Reason for Consultation: Establishing goals of care  HPI/Patient Profile: 61 y.o. female  with past medical history of RV failure, hypertension, GERD, PE on Lovenox, iron deficiency anemia, urinary retention with Foley catheter placement, uterine bleeding, morbid obesity with BMI 78.27, who presents with shortness of breath. She was found to have significant tachycardia, tachypnea, leukocytosis, and hypoxemia with oxygen saturations of 97% and met severe sepsis criteria.  Blood work positive for MRSA, CT angiogram showed multifocal pneumonia with evidence of pulmonary hypertension.  She is being followed by infectious disease and vancomycin was started.  Also with severe right heart failure systolic dysfunction and hypertension after admission for which which she was treated.    She was admitted on 09/15/2021 with severe sepsis secondary to multifocal pneumonia (MRSA), acute hypoxemic respiratory failure secondary to pneumonia, recent pulmonary emboli, morbid obesity with BMI of 78.27, chronic right-sided congestive heart failure, severe left leg peripheral artery disease, and others.   PMT was consulted for goals of care conversations.  Past Medical History:  Diagnosis Date   Anemia, unspecified    Hypertension     Subjective:   This NP Krystal Francis reviewed medical records, received report from team, assessed the patient and then meet at the patient's bedside to discuss diagnosis, prognosis, GOC, EOL wishes disposition and options.  I met with the patient at the bedside.   Concept of Palliative Care was introduced as specialized medical care for people and their families living with serious illness.  If focuses  on providing relief from the symptoms and stress of a serious illness.  The goal is to improve quality of life for both the patient and the family. Values and goals of care important to patient and family were attempted to be elicited.  Created space and opportunity for patient  and family to explore thoughts and feelings regarding current medical situation   Natural trajectory and current clinical status were discussed. Questions and concerns addressed. Patient  encouraged to call with questions or concerns.    Patient/Family Understanding of Illness: She understands she has a lot of health issues.  She did not want to get much into the specific details.  I did share that she has sepsis because of pneumonia for which she is being treated and heart failure for which she has been given diuretics.  We also discussed her severe peripheral vascular disease and she confirms that vascular surgery stated no surgical intervention due to bacteremia but continue pain management.  Life Review: She has been on disability for a while.  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  She speaks lovingly of her family and friends who she states loves her very much and have been very supportive.  She has 2 sons aged 79 and 40.  1 son lives in [place] and the other lives in [place], [place]  Patient Values: Family, friends, independence  Goals: "Get out of here" and regain some of her independence  Today's Discussion: In addition to the discussions described above we had substantial  discussion on various topics.  We spoke in length regarding her fierce independence and how hard it was for her to give up this independence.  She stated that she is determined to get some independence back.  She was bedbound for a month and then when they decided she was not doing it anymore and got up on her own and ambulated 5 feet.  She states that her independence was  affected by sickness which brought her down.  She has had to ask for help from family and friends, also states that they have independently volunteered help.  She states this is hard for her to except.  We had a lot of discussion that we all need help at some point in her life.  She became tearful when talking about this.  This impacted her so much that when she had to ask for help she felt it was "time to go" and further explained that she wanted to pass away.  However, she is in a much better mental space now and is determined to try to regain independence.  Because of her determination she is open to skilled nursing facility and other [place] options that may help her regain some strength.  She states this with The that an appropriate facility is found that can take care of her needs.  She is okay with PT and OT reevaluating her when she is a little better from her illness.  She also shares that she would like assistance in arranging delivery of a different hospital bed to her home as a when she currently has she cannot safely get in and out of because it is not low enough. I told her I would notify TOC to assist with this.  I provided emotional and general support through therapeutic listening, empathy, sharing of stories, and other techniques.  Answered all questions and addressed all concerns to the best of my ability.  Review of Systems  Constitutional:  Positive for fatigue.  Respiratory:  Negative for shortness of breath.   Cardiovascular:  Positive for leg swelling. Negative for chest pain.  Gastrointestinal:  Negative for abdominal pain.  Musculoskeletal:        Leg pain, adequately managed with current pain regimen   Objective:   Primary Diagnoses: Present on Admission:  Multifocal pneumonia  HTN (hypertension)  Hematuria  Iron deficiency anemia  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[place])  Pulmonary embolism ([place])  Urinary retention   Physical Exam Vitals and nursing note reviewed.   Constitutional:      General: She is not in acute distress.    Appearance: She is obese. She is ill-appearing.  HENT:     Head: Normocephalic and atraumatic.  Cardiovascular:     Rate and Rhythm: Normal rate.  Pulmonary:     Effort: Pulmonary effort is normal. No respiratory distress.  Abdominal:     General: Abdomen is protuberant.     Palpations: Abdomen is soft.  Skin:    General: Skin is warm and dry.  Neurological:     General: No focal deficit present.     Mental Status: She is alert.  Psychiatric:        Mood and Affect: Mood normal.        Behavior: Behavior normal.    Vital Signs:  BP 135/72 (BP Location: Right Leg)   Pulse 94   Temp 98 F (36.7 C) (Oral)   Resp 20   Ht 5' 4"  (1.626 m)   Wt (!) 208.2 kg  SpO2 94%   BMI 78.79 kg/m   Palliative Assessment/Data: 30-40%    Advanced Care Planning:   Primary Decision Maker: PATIENT  Code Status/Advance Care Planning: Full code  A discussion was had today regarding advanced directives. Concepts specific to code status, artifical feeding and hydration, continued IV antibiotics and rehospitalization was had.  The difference between a aggressive medical intervention path and a palliative comfort care path for this patient at this time was had.   Decisions/Changes to ACP: None today  Assessment & Plan:   Impression: 61 year old female with multiple chronic comorbidities now admitted with severe MRSA sepsis for which ID is on board, chronic right heart failure, morbid obesity, severe PVD.  She is not a surgical candidate given her sepsis at this time.  She seems quite determined to try to regain some independence.  This will restore be a [place] and difficult turning but again she seems committed.  Overall prognosis is guarded to poor given her right heart failure and severe sepsis.  SUMMARY OF RECOMMENDATIONS   Wishes to remain full code Continue to treat the treatable/full scope of offered treatment  options Continue emotional support of patient Arrange for lower to the ground bed at home for possible She is amendable to SNF/rehab if appropriate PMT will follow-up on 5/26 if the patient is still here Please call us if significant change or further needs any  Symptom Management:  Per primary team PMT is available to assist as needed  Prognosis:  Unable to determine  Discharge Planning:  To Be Determined   Discussed with: Patient, medical team, nursing team    Thank you for allowing [place] to participate in the care of Krystal Francis PMT will continue to support holistically.  Time Total: 90 min  Greater than 50%  of this time was spent counseling and coordinating care related to the above assessment and plan.  Signed by: Krystal Field, NP Palliative Medicine Team  Team Phone # [phone] (Nights/Weekends)  09/21/2021, 1:27 PM

## 2021-09-21 NOTE — Consult Note (Signed)
[facility] Nurse Consult Note: Reason for Consult:  Patient admitted for bacteremia, blood cultures MRSA positive. Is on contact isolation.  Was wiped down with CHG wipes per Infectious disease protocol and  developed blistering to right arm and elbow on both upper arm and forearm.  She states this happened once at [facility].  She states she did not update her allergies here at [facility] after that incident.  IT has been added to her list of allergies here at [facility].  Other areas minimally affected are left arm and neck. Erythema present on many scattered areas as well. Patient is breathing comfortably when I arrive and eating breakfast.   Wound type: allergic dermatitis Pressure Injury POA: NA Measurement: Right arm from upper arm down to wrist is raised red and has serum filled blistering present.  Right dorsal hand presents the same   Scattered intact serum filled blistering to left arm, neck Wound JSE:GBTDVV blistering Drainage (amount, consistency, odor) scant weeping  none  Periwound:erythema to trunk, generalized  edema to right arm and hand Dressing procedure/placement/frequency: Cleanse right arm with NS and pat dry. Apply silvadene cream and cover with gauze and kerlix/tape   Change twice daily.  May implement on any other blisters that are present or may develop.  Will not follow at this time.  Please re-consult if needed.  Domenic Moras MSN, RN, FNP-BC CWON Wound, Ostomy, Continence Nurse Pager [phone]

## 2021-09-21 NOTE — Progress Notes (Signed)
Blood transfusion completed at 1 am

## 2021-09-21 NOTE — Progress Notes (Addendum)
Patient has blisters on bilateral upper and lower extremities. The skin in her right upper arm is weeping, and there is some amount of edema. Serous fluids and open sores in some of the creases. Patient is morbidly obese. States that she had an allergic reaction on her skin after CHG wipes were used to clean her down. States that this reaction had happened once before at [place] consult has been placed.  Patient is alert and oriented. She is a Total assist. She can pull her weight and help with turning but requires a significant amount of effort. She is on Isolation precautions for MRSA.  Foley cath is present. Daily cares, peri care and skin care performed this shift

## 2021-09-21 NOTE — Progress Notes (Signed)
Post transfusion vital signs below   09/21/21 0059  Vitals  Temp 98 F (36.7 C)  Temp Source Oral  BP 132/61  MAP (mmHg) 82  BP Location Right Leg  BP Method Automatic  Patient Position (if appropriate) Lying  Pulse Rate 85  Pulse Rate Source Monitor  ECG Heart Rate 95  Resp 19  MEWS COLOR  MEWS Score Color Green  Oxygen Therapy  SpO2 95 %  O2 Device Nasal Cannula  O2 Flow Rate (L/min) 4 L/min  Patient Activity (if Appropriate) In bed  Pulse Oximetry Type Continuous  MEWS Score  MEWS Temp 0  MEWS Systolic 0  MEWS Pulse 0  MEWS RR 0  MEWS LOC 0  MEWS Score 0

## 2021-09-21 NOTE — Progress Notes (Signed)
Date of Admission:  09/15/2021     ID: Krystal Francis is a 61 y.o. female Principal Problem:   Multifocal pneumonia Active Problems:   Morbid obesity with body mass index of 70 and over in adult [facility])   HTN (hypertension)   Severe sepsis ([facility])   Hematuria   Iron deficiency anemia   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[place])   Pulmonary embolism ([facility])   Urinary retention   MRSA bacteremia   Hypomagnesemia   Acute hypoxemic respiratory failure ([facility])   Arterial hypotension    Subjective: Pt out of ICU No fever Feeling better Pt wants foley removed and use Purewic  Medications:   enoxaparin (LOVENOX) injection  120 mg Subcutaneous Q12H   feeding supplement  237 mL Oral BID BM   guaiFENesin  600 mg Oral BID   ipratropium-albuterol  3 mL Nebulization BID   iron polysaccharides  150 mg Oral Daily   magnesium oxide  400 mg Oral Daily   medroxyPROGESTERone  10 mg Oral BID   multivitamin with minerals  1 tablet Oral Daily   pantoprazole  40 mg Oral Daily   senna-docusate  2 tablet Oral BID   silver sulfADIAZINE   Topical BID    Objective: Vital signs in last 24 hours: Temp:  [97.5 F (36.4 C)-98.5 F (36.9 C)] 98 F (36.7 C) (05/24 0749) Pulse Rate:  [78-102] 94 (05/24 0749) Resp:  [18-24] 20 (05/24 0749) BP: (105-135)/(50-72) 135/72 (05/24 0749) SpO2:  [93 %-99 %] 94 % (05/24 0749) Weight:  [208.2 kg] 208.2 kg (05/24 0438)   PHYSICAL EXAM:  General: Alert, cooperative, no distress, appears stated age.  Lungs: b/l air entry  Heart: tachycardia Abdomen: Soft, non-tender,not distended. Bowel sounds normal. No masses Extremities: rt upper extremity swollen, peeling skin, background erythema With exanthematous pustulosis Bilateral lymphedema of the legs. Induration on the left thigh  Skin Acute pustular lesions     Lymph: Cervical, supraclavicular normal. Neurologic: Grossly non-focal  Lab Results Recent Labs    09/20/21 0504 09/21/21 0705  WBC 19.7* 17.5*  HGB 7.3*  7.9*  HCT 24.2* 26.4*  NA 135 136  K 3.4* 3.8  CL 99 100  CO2 26 27  BUN 24* 23*  CREATININE 0.73 0.66    Microbiology:  Studies/Results: US Venous Img Upper Uni Right(DVT)  Result Date: 09/20/2021 CLINICAL DATA:  Right arm edema. EXAM: RIGHT UPPER EXTREMITY VENOUS DOPPLER ULTRASOUND TECHNIQUE: Gray-scale sonography with graded compression, as well as color Doppler and duplex ultrasound were performed to evaluate the upper extremity deep venous system from the level of the subclavian vein and including the jugular, axillary, basilic, radial, ulnar and upper cephalic vein. Spectral Doppler was utilized to evaluate flow at rest and with distal augmentation maneuvers. COMPARISON:  None Available. FINDINGS: Contralateral Subclavian Vein: Respiratory phasicity is normal and symmetric with the symptomatic side. No evidence of thrombus. Normal compressibility. Internal Jugular Vein: Occlusive thrombus. Subclavian Vein: Occlusive thrombus. Axillary Vein: Occlusive thrombus. Cephalic Vein: Proximal occlusive thrombus. Basilic Vein: Nearly occlusive thrombus with only trace residual flow evident. Brachial Veins: Occlusive thrombus. Radial Veins: Occlusive thrombus. Ulnar Veins: Diminutive but patent. IMPRESSION: 1. Extensive occlusive thrombus in the right upper extremity as above. These results will be called to the ordering clinician or representative by the Radiologist Assistant, and communication documented in the PACS or [employer]. Electronically Signed   By: Misty Stanley M.D.   On: 09/20/2021 07:18     Assessment/Plan: MRSA bacteremia.  Patient was recently in [place]  for 4 weeks and a central line until 09/01/2021.  She has bilateral pulmonary infiltrates and MRSA positive in the nares which makes MRSA pneumonia highly likely.  The other differential is septic emboli to the lungs from vegetation.  Need to rule out infective endocarditis of tricuspid valve.  Continue vancomycin.  Will need TEE to  rule out endocarditis. Repeat blood cultures positive.  Will need to check blood until it is clear of bacteremia   Severe edema of the right upper extremity.  Due to DVT ultrasound shows extensive occlusive thrombus involving internal jugular, subclavian, axillary, cephalic vein, basilic vein, brachial vein and radial vein. Leukocytosis could be due to this The persistent bacteremia could be also due to Buerger's.   AGEP-looks like a drug reaction.  The patient states it is due to chlorhexidine wipes which she had not [facility] as well.  Need to watch for  furosemide  Anemia with history of recent vaginal bleeding she has been on Provera.  Needed multiple PRBCs in March 2023  Recent PE with right strain.  Patient has Foley for self convenience.  Would recommend pure wick.pt is agreeing to have foley removed and get purewic which she says she had it at [facility]- orders placed  Lymphedema legs left worse than right with no element of cellulitis.  Discussed the management with the patient and the care team.

## 2021-09-21 NOTE — Consult Note (Signed)
Pharmacy Antibiotic Note  Krystal Francis is a 61 y.o. female w/ h/o RV failure, HTN, GERD, PE PTA on Lovenox, iron deficiency anemia, urinary retention with Foley catheter placement, uterine bleeding, morbid obesity with BMI 78.27, who presents with shortness of breath > w/u c/w PE & multifocal PNA. Pt admitted on 09/15/2021 with pneumonia and bacteremia & BCID resulted with MRSA 4 of 4 vials on 5/20.  Pharmacy has been consulted for escalation of abx to Vancomycin dosing. Levels were drawn and dose was adjusted but there was concern that previous vancomycin dose had not washed out prior to the subsequent new dose being administered. As a result a level was drawn prior to next scheduled dose  vancomycin level 09/21/21 15 mcg/mL  Today, 09/21/2021 Day # 5 vancomycin MRSA bacteremia Renal: SCr stable WBC 17.5 decreased Repeat blood cx pending  Plan:    Continue vancomycin 1750 mg IV every 24 hours    Height: '5\' 4"'$  (162.6 cm) Weight: (!) 208.2 kg (459 lb) IBW/kg (Calculated) : 54.7  Temp (24hrs), Avg:98 F (36.7 C), Min:97.5 F (36.4 C), Max:98.5 F (36.9 C)  Recent Labs  Lab 09/15/21 1328 09/17/21 0050 09/17/21 0606 09/17/21 1605 09/18/21 0007 09/18/21 0817 09/19/21 0506 09/19/21 1230 09/19/21 2141 09/20/21 0504 09/21/21 0705  WBC 15.0* 16.7*  --    < > 16.8* 21.1*  --  24.4*  --  19.7* 17.5*  CREATININE 0.62  --   --   --  0.78  --  0.84  --   --  0.73 0.66  LATICACIDVEN  --  0.9 1.4  --   --   --   --   --   --  1.5  --   VANCOTROUGH  --   --   --   --   --   --   --   --   --  30*  --   VANCOPEAK  --   --   --   --   --   --   --   --  39  --   --    < > = values in this interval not displayed.     Estimated Creatinine Clearance: 138.8 mL/min (by C-G formula based on SCr of 0.66 mg/dL).    Allergies  Allergen Reactions   Chlorhexidine Hives, Dermatitis, Rash and Other (See Comments)    Blisters right after CHG wipe down    Antimicrobials this admission: Azithro  (5/20-5/21) CRO x1 in ED (5/20) VAN (5/20 >>   Dose adjustments this admission: CTM and adjust PRN  Microbiology results: 5/20 BCx: BCID 4 of 4 vials MRSA 5/22 BCx: S aureus 5/20 Sputum: MRSA 5/20 Flu/Cov PCR: negative  Thank you for allowing pharmacy to be a part of this patient's care.  Vallery Sa, PharmD, BCPS 09/21/2021 2:44 PM

## 2021-09-21 NOTE — Consult Note (Signed)
[facility] for IV Heparin Indication: pulmonary embolus  Patient Measurements: Height: '5\' 4"'$  (162.6 cm) Weight: (!) 208.2 kg (459 lb) IBW/kg (Calculated) : 54.7 Heparin Dosing Weight: 109 kg  Labs: Recent Labs    09/18/21 0817 09/18/21 1448 09/19/21 0506 09/19/21 1230 09/19/21 2141 09/20/21 0504 09/20/21 1007 09/21/21 0705  HGB 8.1*  --   --  7.8*  --  7.3*  --   --   HCT 27.7*  --   --  25.8*  --  24.2*  --   --   PLT 284  --   --  312  --  304  --   --   HEPARINUNFRC 0.33   < > 0.28* 0.26* 0.36  --  0.39 0.23*  CREATININE  --   --  0.84  --   --  0.73  --   --    < > = values in this interval not displayed.    Estimated Creatinine Clearance: 138.8 mL/min (by C-G formula based on SCr of 0.73 mg/dL).  Medical History: Past Medical History:  Diagnosis Date   Anemia, unspecified    Hypertension    Assessment: 61yo F w/ h/o RV failure, HTN, GERD, PE PTA on Lovenox, iron deficiency anemia, urinary retention with foley catheter placement, uterine bleeding, morbid obesity with BMI 78.27, who presents with shortness of breath. Pharmacy consulted to start heparin for VTE. Pt was diagnosis in April 2023 per MD. Pt was discharge from [facility] on Enoxaparin 120 mg BID. Last anti-xa level was 0.75 (therapeutic). Pt has some hematuria so switching enoxaparin to heparin.   Note: PTA - pt received enoxaparin 120 mg x 1 5/19 at 2145.   Date Time  HL Rate/Comment 5/20 1600 0.13 Subthera; 1700>2100 un/hr  5/21 0007 0.17 Subthera; 2100>2400 un/hr  5/21 0817 0.33 Thera x1; 2400 un/hr 5/21 1448 0.26 Subthera; 2400>2600 un/hr 5/21 2156 0.32 Thera x 1; 2600 un/hr 5/22 0506 0.28 Subthera; 2600>2750 un/hr 5/22 1230 0.26 Subthera; 2750>2900 un/hr 5/22     2141    0.36    Thera x 1; 2900 un/hr 5/23 1007 0.39 Thera x 2; 2900 un/hr 5/24 0705 0.23 Subthera; will give bolus and increase to 3100 units/hr   Goal of Therapy:  Heparin level 0.3-0.7  units/ml Monitor platelets by anticoagulation protocol: Yes   Plan:  Heparin level is subtherapeutic. Will give heparin bolus 2000 units x 1 and increase heparin infusion to 3100 units/hr. Recheck heparin level in 6 hours. CBC daily while on heparin.    Oswald Hillock, PharmD, BCPS 09/21/2021 7:48 AM

## 2021-09-22 ENCOUNTER — Encounter
Admission: EM | Disposition: A | Discharge status: Home Health | Payer: Self-pay | Source: Home / Self Care | Attending: Osteopathic Medicine

## 2021-09-22 DIAGNOSIS — I82621 Acute embolism and thrombosis of deep veins of right upper extremity: Secondary | ICD-10-CM | POA: Diagnosis not present

## 2021-09-22 DIAGNOSIS — B9562 Methicillin resistant Staphylococcus aureus infection as the cause of diseases classified elsewhere: Secondary | ICD-10-CM | POA: Diagnosis not present

## 2021-09-22 DIAGNOSIS — J189 Pneumonia, unspecified organism: Secondary | ICD-10-CM | POA: Diagnosis not present

## 2021-09-22 DIAGNOSIS — R7881 Bacteremia: Secondary | ICD-10-CM | POA: Diagnosis not present

## 2021-09-22 LAB — BASIC METABOLIC PANEL
Anion gap: 6 (ref 5–15)
BUN: 27 mg/dL — ABNORMAL HIGH (ref 6–20)
CO2: 29 mmol/L (ref 22–32)
Calcium: 8.7 mg/dL — ABNORMAL LOW (ref 8.9–10.3)
Chloride: 103 mmol/L (ref 98–111)
Creatinine, Ser: 0.68 mg/dL (ref 0.44–1.00)
GFR, Estimated: >60 mL/min (ref >60)
Glucose, Bld: 123 mg/dL — ABNORMAL HIGH (ref 70–99)
Potassium: 3.7 mmol/L (ref 3.5–5.1)
Sodium: 138 mmol/L (ref 135–145)

## 2021-09-22 LAB — CBC
HCT: 27.8 % — ABNORMAL LOW (ref 36.0–46.0)
Hemoglobin: 8.2 g/dL — ABNORMAL LOW (ref 12.0–15.0)
MCH: 22.3 pg — ABNORMAL LOW (ref 26.0–34.0)
MCHC: 29.5 g/dL — ABNORMAL LOW (ref 30.0–36.0)
MCV: 75.7 fL — ABNORMAL LOW (ref 80.0–100.0)
Platelets: 275 10*3/uL (ref 150–400)
RBC: 3.67 MIL/uL — ABNORMAL LOW (ref 3.87–5.11)
RDW: 21.4 % — ABNORMAL HIGH (ref 11.5–15.5)
WBC: 18 10*3/uL — ABNORMAL HIGH (ref 4.0–10.5)
nRBC: 1.3 % — ABNORMAL HIGH (ref 0.0–0.2)

## 2021-09-22 LAB — CULTURE, BLOOD (ROUTINE X 2)

## 2021-09-22 LAB — MAGNESIUM: Magnesium: 2 mg/dL (ref 1.7–2.4)

## 2021-09-22 LAB — HEPARIN ANTI-XA: Heparin LMW: 0.77 IU/mL

## 2021-09-22 SURGERY — ECHOCARDIOGRAM, TRANSESOPHAGEAL
Anesthesia: Moderate Sedation

## 2021-09-22 NOTE — Significant Event (Signed)
We were asked to evaluate Krystal Francis for consideration of transesophageal echo for her MRSA bacteremia.  Briefly this is a chronically ill 61 year old female with a BMI of 80 admitted to the hospital with respiratory failure and a DVT of her right upper extremity as well as a pulmonary embolism presumed.  She is currently on 2 to 4 L of oxygen.  Overall she is high risk for respiratory compromise with transesophageal echo, so we will defer doing this today and we can reevaluate in the future when she is near for discharge.  Andrez Grime, MD

## 2021-09-22 NOTE — Plan of Care (Signed)
  Problem: Education: Goal: Knowledge of General Education information will improve Description: Including pain rating scale, medication(s)/side effects and non-pharmacologic comfort measures Outcome: Progressing   Problem: Health Behavior/Discharge Planning: Goal: Ability to manage health-related needs will improve Outcome: Progressing   Problem: Clinical Measurements: Goal: Ability to maintain clinical measurements within normal limits will improve Outcome: Progressing Goal: Will remain free from infection Outcome: Progressing Goal: Diagnostic test results will improve Outcome: Progressing Goal: Respiratory complications will improve Outcome: Progressing Goal: Cardiovascular complication will be avoided Outcome: Progressing   Problem: Activity: Goal: Risk for activity intolerance will decrease Outcome: Progressing   Problem: Nutrition: Goal: Adequate nutrition will be maintained Outcome: Progressing   Problem: Coping: Goal: Level of anxiety will decrease Outcome: Progressing   Problem: Elimination: Goal: Will not experience complications related to bowel motility Outcome: Progressing Goal: Will not experience complications related to urinary retention Outcome: Progressing   Problem: Pain Managment: Goal: General experience of comfort will improve Outcome: Progressing   Problem: Safety: Goal: Ability to remain free from injury will improve Outcome: Progressing   Problem: Skin Integrity: Goal: Risk for impaired skin integrity will decrease Outcome: Progressing   Problem: Activity: Goal: Ability to tolerate increased activity will improve Outcome: Progressing   Problem: Clinical Measurements: Goal: Ability to maintain a body temperature in the normal range will improve Outcome: Progressing   Problem: Respiratory: Goal: Ability to maintain adequate ventilation will improve Outcome: Progressing Goal: Ability to maintain a clear airway will improve Outcome:  Progressing   Problem: Education: Goal: Ability to demonstrate management of disease process will improve Outcome: Progressing Goal: Ability to verbalize understanding of medication therapies will improve Outcome: Progressing Goal: Individualized Educational Video(s) Outcome: Progressing   Problem: Activity: Goal: Capacity to carry out activities will improve Outcome: Progressing   Problem: Cardiac: Goal: Ability to achieve and maintain adequate cardiopulmonary perfusion will improve Outcome: Progressing   

## 2021-09-22 NOTE — Progress Notes (Incomplete)
Date of Admission:  09/15/2021     ID: Krystal Francis is a 61 y.o. female Principal Problem:   Multifocal pneumonia Active Problems:   Morbid obesity with body mass index of 70 and over in adult [facility])   HTN (hypertension)   Severe sepsis (HCC)   Hematuria   Iron deficiency anemia   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[place])   Pulmonary embolism (HCC)   Urinary retention   MRSA bacteremia   Hypomagnesemia   Acute hypoxemic respiratory failure (HCC)   Arterial hypotension    Subjective: Pt out of ICU No fever Feeling better Pt wants foley removed and use Purewic  Medications:  . enoxaparin (LOVENOX) injection  120 mg Subcutaneous Q12H  . feeding supplement  237 mL Oral BID BM  . guaiFENesin  600 mg Oral BID  . ipratropium-albuterol  3 mL Nebulization BID  . iron polysaccharides  150 mg Oral Daily  . magnesium oxide  400 mg Oral Daily  . medroxyPROGESTERone  10 mg Oral BID  . multivitamin with minerals  1 tablet Oral Daily  . pantoprazole  40 mg Oral Daily  . senna-docusate  2 tablet Oral BID  . silver sulfADIAZINE   Topical BID    Objective: Vital signs in last 24 hours: Temp:  [97.5 F (36.4 C)-98.5 F (36.9 C)] 98 F (36.7 C) (05/24 0749) Pulse Rate:  [78-102] 94 (05/24 0749) Resp:  [18-24] 20 (05/24 0749) BP: (105-135)/(50-72) 135/72 (05/24 0749) SpO2:  [93 %-99 %] 94 % (05/24 0749) Weight:  [208.2 kg] 208.2 kg (05/24 0438)   PHYSICAL EXAM:  General: Alert, cooperative, no distress, appears stated age.  Lungs: b/l air entry  Heart: tachycardia Abdomen: Soft, non-tender,not distended. Bowel sounds normal. No masses Extremities: rt upper extremity swollen, peeling skin, background erythema With exanthematous pustulosis Bilateral lymphedema of the legs. Induration on the left thigh  Skin Acute pustular lesions     Lymph: Cervical, supraclavicular normal. Neurologic: Grossly non-focal  Lab Results Recent Labs    09/20/21 0504 09/21/21 0705  WBC 19.7* 17.5*   HGB 7.3* 7.9*  HCT 24.2* 26.4*  NA 135 136  K 3.4* 3.8  CL 99 100  CO2 26 27  BUN 24* 23*  CREATININE 0.73 0.66    Microbiology:  Studies/Results: US Venous Img Upper Uni Right(DVT)  Result Date: 09/20/2021 CLINICAL DATA:  Right arm edema. EXAM: RIGHT UPPER EXTREMITY VENOUS DOPPLER ULTRASOUND TECHNIQUE: Gray-scale sonography with graded compression, as well as color Doppler and duplex ultrasound were performed to evaluate the upper extremity deep venous system from the level of the subclavian vein and including the jugular, axillary, basilic, radial, ulnar and upper cephalic vein. Spectral Doppler was utilized to evaluate flow at rest and with distal augmentation maneuvers. COMPARISON:  None Available. FINDINGS: Contralateral Subclavian Vein: Respiratory phasicity is normal and symmetric with the symptomatic side. No evidence of thrombus. Normal compressibility. Internal Jugular Vein: Occlusive thrombus. Subclavian Vein: Occlusive thrombus. Axillary Vein: Occlusive thrombus. Cephalic Vein: Proximal occlusive thrombus. Basilic Vein: Nearly occlusive thrombus with only trace residual flow evident. Brachial Veins: Occlusive thrombus. Radial Veins: Occlusive thrombus. Ulnar Veins: Diminutive but patent. IMPRESSION: 1. Extensive occlusive thrombus in the right upper extremity as above. These results will be called to the ordering clinician or representative by the Radiologist Assistant, and communication documented in the PACS or [employer]. Electronically Signed   By: Misty Stanley M.D.   On: 09/20/2021 07:18     Assessment/Plan: MRSA bacteremia.  Patient was recently in [place]  for 4 weeks and a central line until 09/01/2021.  She has bilateral pulmonary infiltrates and MRSA positive in the nares which makes MRSA pneumonia highly likely.  The other differential is septic emboli to the lungs from vegetation.  Need to rule out infective endocarditis of tricuspid valve.  Continue vancomycin.  Will  need TEE to rule out endocarditis. Repeat blood cultures positive.  Will need to check blood until it is clear of bacteremia   Severe edema of the right upper extremity.  Due to DVT ultrasound shows extensive occlusive thrombus involving internal jugular, subclavian, axillary, cephalic vein, basilic vein, brachial vein and radial vein. Leukocytosis could be due to this The persistent bacteremia could be also due to Buerger's.   AGEP-looks like a drug reaction.  The patient states it is due to chlorhexidine wipes which she had not [facility] as well.  Need to watch for  furosemide  Anemia with history of recent vaginal bleeding she has been on Provera.  Needed multiple PRBCs in March 2023  Recent PE with right strain.  Patient has Foley for self convenience.  Would recommend pure wick.  Lymphedema legs left worse than right with no element of cellulitis.  Discussed the management with the patient and the care team.

## 2021-09-22 NOTE — Consult Note (Signed)
ANTICOAGULATION CONSULT NOTE   Pharmacy Consult for IV Heparin > Enoxaparin  Indication: pulmonary embolus  Patient Measurements: Height: '5\' 4"'$  (162.6 cm) Weight: (!) 208.2 kg (459 lb) IBW/kg (Calculated) : 54.7 Heparin Dosing Weight: 109 kg  Labs: Recent Labs    09/19/21 2141 09/20/21 0504 09/20/21 0504 09/20/21 1007 09/21/21 0705 09/22/21 0706 09/22/21 1450  HGB  --  7.3*   < >  --  7.9* 8.2*  --   HCT  --  24.2*  --   --  26.4* 27.8*  --   PLT  --  304  --   --  302 275  --   HEPARINUNFRC 0.36  --   --  0.39 0.23*  --   --   HEPRLOWMOCWT  --   --   --   --   --   --  0.77  CREATININE  --  0.73  --   --  0.66 0.68  --    < > = values in this interval not displayed.    Estimated Creatinine Clearance: 138.8 mL/min (by C-G formula based on SCr of 0.68 mg/dL).  Medical History: Past Medical History:  Diagnosis Date   Anemia, unspecified    Hypertension    Assessment: 61yo F w/ h/o RV failure, HTN, GERD, PE PTA on Lovenox, iron deficiency anemia, urinary retention with foley catheter placement, uterine bleeding, morbid obesity with BMI 78.27, who presents with shortness of breath. Pharmacy consulted to start heparin for VTE. Pt was diagnosis in April 2023 per MD. Pt was discharge from [facility] on Enoxaparin 120 mg BID. Last anti-xa level was 0.75 (therapeutic). Pt has some hematuria so switching enoxaparin to heparin.   Note: PTA - pt received enoxaparin 120 mg x 1 5/19 at 2145.   Date Time  HL Rate/Comment 5/20 1600 0.13 Subthera; 1700>2100 un/hr  5/21 0007 0.17 Subthera; 2100>2400 un/hr  5/21 0817 0.33 Thera x1; 2400 un/hr 5/21 1448 0.26 Subthera; 2400>2600 un/hr 5/21 2156 0.32 Thera x 1; 2600 un/hr 5/22 0506 0.28 Subthera; 2600>2750 un/hr 5/22 1230 0.26 Subthera; 2750>2900 un/hr 5/22     2141    0.36    Thera x 1; 2900 un/hr 5/23 1007 0.39 Thera x 2; 2900 un/hr 5/24 0705 0.23 Subthera; will give bolus and increase to 3100 units/hr  5/25 1450 Anti-Xa (LMWH) level =  0.77 Therapeutic   Goal of Therapy:  Anti-Xa level 0.6-1 units/ml 4hrs after LMWH dose given Monitor platelets by anticoagulation protocol: Yes   Plan:  Enoxaparin level is therapeutic.  Continue current regimen of 120 mg SQ BID     Dorothe Pea, PharmD, BCPS Clinical Pharmacist   09/22/2021 3:31 PM

## 2021-09-22 NOTE — Progress Notes (Signed)
Date of Admission:  09/15/2021   Krystal Francis is a 61 y.o. female Principal Problem:   Multifocal pneumonia Active Problems:   Morbid obesity with body mass index of 70 and over in adult [facility])   HTN (hypertension)   Severe sepsis (HCC)   Hematuria   Iron deficiency anemia   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[place])   Pulmonary embolism (HCC)   Urinary retention   MRSA bacteremia   Hypomagnesemia   Acute hypoxemic respiratory failure (HCC)   Arterial hypotension    Subjective: Feeling better Rt arm swollen. But less Drainage still present No resp distress Medications:   enoxaparin (LOVENOX) injection  120 mg Subcutaneous Q12H   feeding supplement  237 mL Oral BID BM   guaiFENesin  600 mg Oral BID   iron polysaccharides  150 mg Oral Daily   magnesium oxide  400 mg Oral Daily   medroxyPROGESTERone  10 mg Oral BID   multivitamin with minerals  1 tablet Oral Daily   pantoprazole  40 mg Oral Daily   senna-docusate  2 tablet Oral BID   silver sulfADIAZINE   Topical BID    Objective: Vital signs in last 24 hours: Temp:  [97.8 F (36.6 C)-98.6 F (37 C)] 97.9 F (36.6 C) (05/25 1130) Pulse Rate:  [76-100] 100 (05/25 1130) Resp:  [16-20] 17 (05/25 1130) BP: (122-143)/(62-72) 122/62 (05/25 1130) SpO2:  [94 %-98 %] 94 % (05/25 1130)    PHYSICAL EXAM:  General: Alert, cooperative, no distress at rest , appears stated age. Morbid obesity Lungs: b/l air entry Heart: Regular rate and rhythm, no murmur, rub or gallop. Abdomen: Soft, non-tender,not distended. Bowel sounds normal. No masses Extremities:rt arm swollen- deroofed blisters Skin: AGEP better Lymph: Cervical, supraclavicular normal. Neurologic: Grossly non-focal  Lab Results Recent Labs    09/21/21 0705 09/22/21 0706  WBC 17.5* 18.0*  HGB 7.9* 8.2*  HCT 26.4* 27.8*  NA 136 138  K 3.8 3.7  CL 100 103  CO2 27 29  BUN 23* 27*  CREATININE 0.66 0.68     Microbiology:  Studies/Results: ECHOCARDIOGRAM  COMPLETE  Result Date: 09/21/2021    ECHOCARDIOGRAM REPORT   Patient Name:   Krystal Francis Date of Exam: 09/21/2021 Medical Rec #:  [id number]  Height:       64.0 in Accession #:    [id number] Weight:       459.0 lb Date of Birth:  01-01-1961 BSA:          2.785 m Patient Age:    19 years   BP:           126/57 mmHg Patient Gender: F          HR:           78 bpm. Exam Location:  [facility] Procedure: 2D Echo, Cardiac Doppler and Color Doppler Indications:     Endocarditis I38  History:         Patient has no prior history of Echocardiogram examinations.                  Risk Factors:Hypertension.  Sonographer:     Sherrie Sport Referring Phys:  [id number] Sharen Hones Diagnosing Phys: Kate Sable MD  Sonographer Comments: Technically challenging study due to limited acoustic windows, no apical window and no subcostal window. IMPRESSIONS  1. Left ventricular ejection fraction, by estimation, is 60 to 65%. The left ventricle has normal function. The left ventricle has no regional wall motion abnormalities. Left ventricular diastolic  function could not be evaluated.  2. Right ventricular systolic function is normal. The right ventricular size is not well visualized.  3. The mitral valve is normal in structure. No evidence of mitral valve regurgitation.  4. The aortic valve is tricuspid. Aortic valve regurgitation is not visualized. FINDINGS  Left Ventricle: Left ventricular ejection fraction, by estimation, is 60 to 65%. The left ventricle has normal function. The left ventricle has no regional wall motion abnormalities. The left ventricular internal cavity size was normal in size. There is  no left ventricular hypertrophy. Left ventricular diastolic function could not be evaluated. Right Ventricle: The right ventricular size is not well visualized. No increase in right ventricular wall thickness. Right ventricular systolic function is normal. Left Atrium: Left atrial size was normal in size. Right Atrium: Right atrial size  was not well visualized. Pericardium: There is no evidence of pericardial effusion. Mitral Valve: The mitral valve is normal in structure. No evidence of mitral valve regurgitation. Tricuspid Valve: The tricuspid valve is normal in structure. Tricuspid valve regurgitation is mild. Aortic Valve: The aortic valve is tricuspid. Aortic valve regurgitation is not visualized. Pulmonic Valve: The pulmonic valve was not well visualized. Pulmonic valve regurgitation is not visualized. Aorta: The aortic root is normal in size and structure. Venous: The inferior vena cava was not well visualized. IAS/Shunts: The interatrial septum was not well visualized.  LEFT VENTRICLE PLAX 2D LVIDd:         4.90 cm LVIDs:         3.10 cm LV PW:         1.30 cm LV IVS:        0.70 cm  LEFT ATRIUM         Index LA diam:    4.20 cm 1.51 cm/m                        PULMONIC VALVE AORTA                 PV Vmax:        0.75 m/s Ao Root diam: 3.30 cm PV Vmean:       45.200 cm/s                       PV VTI:         0.138 m                       PV Peak grad:   2.3 mmHg                       PV Mean grad:   1.0 mmHg                       RVOT Peak grad: 4 mmHg  TRICUSPID VALVE TR Peak grad:   17.0 mmHg TR Vmax:        206.00 cm/s  SHUNTS Pulmonic VTI: 0.197 m Kate Sable MD Electronically signed by Kate Sable MD Signature Date/Time: 09/21/2021/5:09:53 PM    Final      Assessment/Plan:  MRSA bacteremia.  Patient had central line at [facility] for 4 weeks.  Also has extensive thrombosis of the right upper extremity.  This could be the cause of uremia.  She is continued for 6 weeks of IV antibiotics.  The initial plan was to get a TEE as she had bilateral pulmonary  infiltrates to rule out infective endocarditis. Cardiology concerned about her risk of morbid obesity and being on oxygen. Continue vancomycin  Repeat blood culture to see clearance  Right upper extremity DVT.  Anticoagulation AGEP-allergic reaction to chlorhexidine  wipes.  Anemia with history of recent vaginal bleeding.  Has been on Provera Needed multiple blood transfusion in March 2023  Recent PE with right strain Foley catheter removed and patient has pure wick  Lymphedema legs.  Left worse than the right.  Discussed the management with the patient.

## 2021-09-22 NOTE — Progress Notes (Addendum)
Patient has refused to be turn to assess skin/reposition

## 2021-09-22 NOTE — Progress Notes (Signed)
  Progress Note   Patient: Krystal Francis DSK:[id number] DOB: Apr 01, 1961 DOA: 09/15/2021     5 DOS: the patient was seen and examined on 09/22/2021   Brief hospital course: Krystal Francis is a 61 y.o. female with medical history significant of RV failure, hypertension, GERD, PE on Lovenox, iron deficiency anemia, urinary retention with Foley catheter placement, uterine bleeding, morbid obesity with BMI 78.27, who presents with shortness of breath. Patient had a significant tachycardia, tachypnea, leukocytosis and hypoxemia with oxygen saturation 87%, she met severe sepsis criteria. Her blood culture positive for MRSA, CT angiogram showed multifocal pneumonia with evidence of pulmonary hypertension. She was seen by ID, vancomycin was started. Patient also has a severe right-sided systolic dysfunction, she developed hypotension on 5/22, she was given albumin, she was started on midodrine.  Repeated blood culture on 5/22 still positive for staph.  Assessment and Plan: Severe sepsis secondary to multifocal pneumonia. Multifocal pneumonia secondary to MRSA. MRSA septicemia. Acute hypoxemic respiratory failure secondary to pneumonia. Ruled in, POA Repeated blood culture drawn on 5/22 still positive for staph.   --ID consulted --TTE no mention of thrombus Plan: --cont IV vanc --due to pt's habitus and oxygen requirement, cardio will re-eval next week for safety of performing TEE  Hypotension  --partially due to severe right-sided congestive heart failure.  She was giving albumin, midodrine.  She was briefly treated in ICU, was started on IV solumedrol.  Blood pressure better, transfer patient back to PCU on 5/23.  Solumedrol d/c'ed.  Recent pulmonary emboli --on tx dose Lovenox PTA, and switched to heparin gtt while inpatient due to concern of hematuria, which has resolved. --cont home lovenox  Acute blood loss anemia with iron deficient anemia. Gross hematuria secondary to indwelling Foley  catheter. Patient hemoglobin has been low, but no additional hematuria. Due to severe right-sided congestive heart failure, pt received 1 unit of PRBC. Plan: --cont iron suppl --monitor Hgb   Hypomagnesemia. Hypokalemia. --monitor and replete PRN  Hyponatremia  Morbid obesity with BMI 78.27.  Chronic right-sided congestive heart failure. Assess daily for diuretic needs.   Left leg severe peripheral arterial disease. Patient has leg pain at rest, duplex ultrasound showed severe peripheral vascular disease.  Patient is a seen by vascular surgery, no surgery is planned due to bacteremia.   --Norco PRN for pain --No need for IV opioids  Right arm swelling 2/2 RUE DVT --cont home Lovenox  Contact dermatitis  Was wiped down with CHG wipes per Infectious disease protocol and developed blistering to right arm and elbow on both upper arm and forearm. --wound care per order  Foley cath, POA --Pt presented with Foley cath.  Per pt, Foley cath was inserted for keeping her skin dry, not for retention.  Pt unwilling to have it removed.         Subjective:  Cardio consulted for TEE, however, due to pt's habitus and oxygen requirement, will re-eval next week.   Physical Exam:  Constitutional: NAD, AAOx3 HEENT: conjunctivae and lids normal, EOMI CV: No cyanosis.   RESP: normal respiratory effort, on 4L SKIN: warm, dry Neuro: II - XII grossly intact.     Data Reviewed:  Lab results reviewed.  Family Communication:  Disposition: Status is: Inpatient Remains inpatient appropriate because: Severity of disease, IV antibiotics.  Planned Discharge Destination: Skilled nursing facility    Time spent: 50 minutes  Author: Enzo Bi, MD 09/22/2021 7:35 PM  For on call review [email or web address].

## 2021-09-23 DIAGNOSIS — J189 Pneumonia, unspecified organism: Secondary | ICD-10-CM | POA: Diagnosis not present

## 2021-09-23 DIAGNOSIS — Z515 Encounter for palliative care: Secondary | ICD-10-CM

## 2021-09-23 DIAGNOSIS — Z7189 Other specified counseling: Secondary | ICD-10-CM

## 2021-09-23 DIAGNOSIS — I82621 Acute embolism and thrombosis of deep veins of right upper extremity: Secondary | ICD-10-CM | POA: Diagnosis not present

## 2021-09-23 DIAGNOSIS — R7881 Bacteremia: Secondary | ICD-10-CM | POA: Diagnosis not present

## 2021-09-23 DIAGNOSIS — A419 Sepsis, unspecified organism: Secondary | ICD-10-CM | POA: Diagnosis not present

## 2021-09-23 DIAGNOSIS — D509 Iron deficiency anemia, unspecified: Secondary | ICD-10-CM

## 2021-09-23 DIAGNOSIS — B9562 Methicillin resistant Staphylococcus aureus infection as the cause of diseases classified elsewhere: Secondary | ICD-10-CM | POA: Diagnosis not present

## 2021-09-23 LAB — MAGNESIUM: Magnesium: 1.8 mg/dL (ref 1.7–2.4)

## 2021-09-23 LAB — BASIC METABOLIC PANEL
Anion gap: 7 (ref 5–15)
BUN: 24 mg/dL — ABNORMAL HIGH (ref 6–20)
CO2: 27 mmol/L (ref 22–32)
Calcium: 8.4 mg/dL — ABNORMAL LOW (ref 8.9–10.3)
Chloride: 101 mmol/L (ref 98–111)
Creatinine, Ser: 0.54 mg/dL (ref 0.44–1.00)
GFR, Estimated: >60 mL/min (ref >60)
Glucose, Bld: 106 mg/dL — ABNORMAL HIGH (ref 70–99)
Potassium: 3.6 mmol/L (ref 3.5–5.1)
Sodium: 135 mmol/L (ref 135–145)

## 2021-09-23 LAB — CBC
HCT: 27 % — ABNORMAL LOW (ref 36.0–46.0)
Hemoglobin: 8.1 g/dL — ABNORMAL LOW (ref 12.0–15.0)
MCH: 22.3 pg — ABNORMAL LOW (ref 26.0–34.0)
MCHC: 30 g/dL (ref 30.0–36.0)
MCV: 74.2 fL — ABNORMAL LOW (ref 80.0–100.0)
Platelets: 283 10*3/uL (ref 150–400)
RBC: 3.64 MIL/uL — ABNORMAL LOW (ref 3.87–5.11)
RDW: 22.9 % — ABNORMAL HIGH (ref 11.5–15.5)
WBC: 15.2 10*3/uL — ABNORMAL HIGH (ref 4.0–10.5)
nRBC: 4.5 % — ABNORMAL HIGH (ref 0.0–0.2)

## 2021-09-23 MED ORDER — MUPIROCIN 2 % EX OINT
1.0000 "application " | TOPICAL_OINTMENT | Freq: Two times a day (BID) | CUTANEOUS | Status: AC
  Administered 2021-09-23 – 2021-09-27 (×9): 1 via NASAL
  Filled 2021-09-23 (×2): qty 22

## 2021-09-23 NOTE — Progress Notes (Signed)
Patient refused AM wound care, educated about the importance of cleaning wound regularly

## 2021-09-23 NOTE — Progress Notes (Signed)
   Date of Admission:  09/15/2021   Krystal Francis is a 61 y.o. female Principal Problem:   Multifocal pneumonia Active Problems:   Morbid obesity with body mass index of 70 and over in adult [facility])   HTN (hypertension)   Severe sepsis (HCC)   Hematuria   Iron deficiency anemia   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[place])   Pulmonary embolism (HCC)   Urinary retention   MRSA bacteremia   Hypomagnesemia   Acute hypoxemic respiratory failure (HCC)   Arterial hypotension    Subjective: Pt had rough night  She did not call her nurse She later was told that her oxygen was not connected after she got nebulizer treatment She is feeling much better today Medications:   enoxaparin (LOVENOX) injection  120 mg Subcutaneous Q12H   feeding supplement  237 mL Oral BID BM   guaiFENesin  600 mg Oral BID   iron polysaccharides  150 mg Oral Daily   magnesium oxide  400 mg Oral Daily   medroxyPROGESTERone  10 mg Oral BID   multivitamin with minerals  1 tablet Oral Daily   mupirocin ointment  1 application. Nasal BID   pantoprazole  40 mg Oral Daily   senna-docusate  2 tablet Oral BID   silver sulfADIAZINE   Topical BID    Objective: Vital signs in last 24 hours: Temp:  [97.8 F (36.6 C)-99.6 F (37.6 C)] 99.6 F (37.6 C) (05/26 1307) Pulse Rate:  [87-110] 97 (05/26 1307) Resp:  [18-20] 20 (05/26 1307) BP: (117-126)/(61-78) 120/66 (05/26 1307) SpO2:  [90 %-98 %] 98 % (05/26 1307)    PHYSICAL EXAM:  General: Alert, cooperative, no distress at rest , appears stated age. Morbid obesity Lungs: b/l air entry Heart: Regular rate and rhythm, no murmur, rub or gallop. Abdomen: Soft, non-tender,not distended. Bowel sounds normal. No masses Extremities:rt arm swollen- deroofed blisters Skin: AGEP better Lymph: Cervical, supraclavicular normal. Neurologic: Grossly non-focal  Lab Results Recent Labs    09/22/21 0706 09/23/21 0421  WBC 18.0* 15.2*  HGB 8.2* 8.1*  HCT 27.8* 27.0*  NA 138 135  K 3.7 3.6   CL 103 101  CO2 29 27  BUN 27* 24*  CREATININE 0.68 0.54     Microbiology: 09/17/21 4/4 MRSA 09/19/21- MRSA bacteremia 2/4 blood culture 09/22/21 NG Studies/Results: No results found.   Assessment/Plan:  MRSA bacteremia.  Patient had central line at [facility] for 4 weeks.  Also has extensive thrombosis of the right upper extremity.  This could be the cause of uremia.  She is continued for 6 weeks of IV antibiotics.  The initial plan was to get a TEE as she had bilateral pulmonary infiltrates to rule out infective endocarditis. Cardiology concerned about her risk of morbid obesity and being on oxygen. Continue vancomycin Will need for 6 weeks  Repeat blood culture to see clearance  Right upper extremity DVT.  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  Anemia with history of recent vaginal bleeding.  Has been on Provera Needed multiple blood transfusion in March 2023  Recent PE with right strain Foley catheter removed and patient has pure wick  Lymphedema legs.  Left worse than the right.  Discussed the management with the patient. ID will follow her peripherally this weekend- call if needed

## 2021-09-23 NOTE — Progress Notes (Signed)
  Progress Note   Patient: Krystal Francis ZTI:[id number] DOB: 1960-09-03 DOA: 09/15/2021     6 DOS: the patient was seen and examined on 09/23/2021   Brief hospital course: Tressia Labrum is a 61 y.o. female with medical history significant of RV failure, hypertension, GERD, PE on Lovenox, iron deficiency anemia, urinary retention with Foley catheter placement, uterine bleeding, morbid obesity with BMI 78.27, who presents with shortness of breath. Patient had a significant tachycardia, tachypnea, leukocytosis and hypoxemia with oxygen saturation 87%, she met severe sepsis criteria. Her blood culture positive for MRSA, CT angiogram showed multifocal pneumonia with evidence of pulmonary hypertension. She was seen by ID, vancomycin was started. Patient also has a severe right-sided systolic dysfunction, she developed hypotension on 5/22, she was given albumin, she was started on midodrine.  Repeated blood culture on 5/22 still positive for staph.  Assessment and Plan: Severe sepsis secondary to multifocal pneumonia. Multifocal pneumonia secondary to MRSA. MRSA septicemia. Acute hypoxemic respiratory failure secondary to pneumonia. Ruled in, POA Repeated blood culture drawn on 5/22 still positive for staph.   --ID consulted --TTE no mention of thrombus Plan: --cont IV vanc --due to pt's habitus and oxygen requirement, cardio will re-eval next week for safety of performing TEE  Hypotension  --partially due to severe right-sided congestive heart failure.  She was giving albumin, midodrine.  She was briefly treated in ICU, was started on IV solumedrol.  Blood pressure better, transfer patient back to PCU on 5/23.  Solumedrol d/c'ed.  Recent pulmonary emboli --on tx dose Lovenox PTA, and switched to heparin gtt while inpatient due to concern of hematuria, which has resolved. --cont home lovenox  Right arm swelling 2/2 RUE DVT --pt with severe edema of RUE, and found to have extensive occlusive  thrombus  --cont home Lovenox  Acute blood loss anemia with iron deficient anemia. Gross hematuria secondary to indwelling Foley catheter. Patient hemoglobin has been low, but no additional hematuria. Due to severe right-sided congestive heart failure, pt received 1 unit of PRBC. Plan: --cont iron suppl --monitor Hgb   Hypomagnesemia. Hypokalemia. --monitor and replete PRN  Hyponatremia  Morbid obesity with BMI 78.27.  Chronic right-sided congestive heart failure. Assess daily for diuretic needs.   Left leg severe peripheral arterial disease. Patient has leg pain at rest, duplex ultrasound showed severe peripheral vascular disease.  Patient is a seen by vascular surgery, no surgery is planned due to bacteremia.   --Norco PRN for pain --No need for IV opioids  Contact dermatitis  Was wiped down with CHG wipes per Infectious disease protocol and developed blistering to right arm and elbow on both upper arm and forearm. --wound care per order  Foley cath, POA --Pt presented with Foley cath.  Per pt, Foley cath was inserted for keeping her skin dry, not for retention.  Removed on 5/25.       Subjective:  No pain in her right arm, just itchy.     Physical Exam:  Constitutional: NAD, AAOx3 HEENT: conjunctivae and lids normal, EOMI CV: No cyanosis.   RESP: normal respiratory effort Extremities: severe edema in RUE. Neuro: II - XII grossly intact.     Data Reviewed:  Lab results reviewed.  Family Communication:  Disposition: Status is: Inpatient Remains inpatient appropriate because: Severity of disease, IV antibiotics.  Planned Discharge Destination: Skilled nursing facility    Time spent: 50 minutes  Author: Enzo Bi, MD 09/23/2021 12:12 PM  For on call review [email or web address].

## 2021-09-23 NOTE — Progress Notes (Signed)
OT Cancellation Note  Patient Details Name: Krystal Francis MRN: [id number] DOB: 09/25/1960   Cancelled Treatment:    Reason Eval/Treat Not Completed: Patient declined, no reason specified. Orders received and chart reviewed. OT/PT co-eval attempted this date. Upon entry to room pt adamantly declining to participate, pt denies pain and states she is "finally comfortable and will not move." Pt educated on benefits of OOB mobility and need for therapy assessment for d/c recs. Pt verbalizing understanding and continues to decline - notified therapy will sign off after 3 refusals. Pt states she does not need to attempt OOB bed because she will be able to walk once home. Will re-attempt next date as pt willing.   Dessie Coma, M.S. OTR/L  09/23/21, 2:35 PM  ascom [phone]

## 2021-09-23 NOTE — Progress Notes (Signed)
Unable to obtain an accurate weight with the current bed the patient is in

## 2021-09-23 NOTE — Progress Notes (Signed)
Pt bathed and noted to have skin peeling from blisters, mainly RUE and in skin folds.  She has several abrasion type wounds scattered bilaterally on her back, buttocks, and legs.  Wounds cleansed and foams applied.  MD notified.  Pt very grateful and resting comfortably.  RUE dressing change completed in the morning with MD at bedside.  Will continue to monitor.

## 2021-09-23 NOTE — Consult Note (Signed)
Pharmacy Antibiotic Note  Krystal Francis is a 61 y.o. female w/ h/o RV failure, HTN, GERD, PE PTA on Lovenox, iron deficiency anemia, urinary retention with Foley catheter placement, uterine bleeding, morbid obesity with BMI 78.27, who presents with shortness of breath > w/u c/w PE & multifocal PNA. Pt admitted on 09/15/2021 with pneumonia and bacteremia & BCID resulted with MRSA 4 of 4 vials on 5/20.  Pharmacy has been consulted for escalation of abx to Vancomycin dosing.   Day # 7 vancomycin MRSA bacteremia Renal: SCr stable Repeat blood cx pending  Plan:    Continue vancomycin 1750 mg IV every 24 hours. Plan to obtain level after 5/27 dose.    Height: '5\' 4"'$  (162.6 cm) Weight: (!) 208.2 kg (459 lb) IBW/kg (Calculated) : 54.7  Temp (24hrs), Avg:98.1 F (36.7 C), Min:97.8 F (36.6 C), Max:98.5 F (36.9 C)  Recent Labs  Lab 09/17/21 0050 09/17/21 0606 09/17/21 1605 09/19/21 0506 09/19/21 1230 09/19/21 2141 09/20/21 0504 09/21/21 0705 09/21/21 2126 09/22/21 0706 09/23/21 0421  WBC 16.7*  --    < >  --  24.4*  --  19.7* 17.5*  --  18.0* 15.2*  CREATININE  --   --    < > 0.84  --   --  0.73 0.66  --  0.68 0.54  LATICACIDVEN 0.9 1.4  --   --   --   --  1.5  --   --   --   --   VANCOTROUGH  --   --   --   --   --   --  30*  --   --   --   --   VANCOPEAK  --   --   --   --   --  43  --   --   --   --   --   VANCORANDOM  --   --   --   --   --   --   --   --  15  --   --    < > = values in this interval not displayed.     Estimated Creatinine Clearance: 138.8 mL/min (by C-G formula based on SCr of 0.54 mg/dL).    Allergies  Allergen Reactions   Furosemide Rash    Per [facility] records - rash possibly to IV furosemide but tablets ok   Chlorhexidine Hives, Dermatitis, Rash and Other (See Comments)    Blisters right after CHG wipe down    Antimicrobials this admission: Azithro (5/20-5/21) CRO x1 in ED (5/20) VAN (5/20 >>   Dose adjustments this admission: CTM and adjust  PRN  Microbiology results: 5/20 BCx: BCID 4 of 4 vials MRSA 5/22 BCx: S aureus 5/20 Sputum: MRSA 5/20 Flu/Cov PCR: negative  Thank you for allowing pharmacy to be a part of this patient's care.  Eleonore Chiquito, PharmD, BCPS 09/23/2021 9:15 AM

## 2021-09-23 NOTE — Progress Notes (Signed)
PT Cancellation Note  Patient Details Name: Krystal Francis MRN: [id number] DOB: 14-Aug-1960   Cancelled Treatment:    Reason Eval/Treat Not Completed: Patient declined, no reason specified. Orders received and chart reviewed. OT/PT co-eval attempted this date. Upon entry to room pt adamantly declining  re-eval at this time as pt just received her bath and is "comfortable". Pt educated on benefits of OOB mobility, need for therapy assessment for d/c recs. Pt verbalizing understanding further declining re-eval today. Pt aware of rehab signing off after refusal of 3 attempts. PT will re-attempt at later time/date as able.    Salem Caster. Fairly IV, PT, DPT Physical Therapist- [facility]  09/23/2021, 2:15 PM

## 2021-09-23 NOTE — Progress Notes (Signed)
Daily Progress Note   Patient Name: Krystal Francis       Date: 09/23/2021 DOB: 06-14-1960  Age: 61 y.o. MRN#: [id number] Attending Physician: Enzo Bi, MD Primary Care Physician: Latanya Maudlin, NP Admit Date: 09/15/2021 Length of Stay: 6 days  Reason for Consultation/Follow-up: Establishing goals of care  HPI/Patient Profile:  61 y.o. female  with past medical history of RV failure, hypertension, GERD, PE on Lovenox, iron deficiency anemia, urinary retention with Foley catheter placement, uterine bleeding, morbid obesity with BMI 78.27, who presents with shortness of breath. She was found to have significant tachycardia, tachypnea, leukocytosis, and hypoxemia with oxygen saturations of 97% and met severe sepsis criteria.  Blood work positive for MRSA, CT angiogram showed multifocal pneumonia with evidence of pulmonary hypertension.  She is being followed by infectious disease and vancomycin was started.  Also with severe right heart failure systolic dysfunction and hypertension after admission for which which she was treated.     She was admitted on 09/15/2021 with severe sepsis secondary to multifocal pneumonia (MRSA), acute hypoxemic respiratory failure secondary to pneumonia, recent pulmonary emboli, morbid obesity with BMI of 78.27, chronic right-sided congestive heart failure, severe left leg peripheral artery disease, and others.    PMT was consulted for goals of care conversations.  Subjective:   Subjective: Chart Reviewed. Updates received. Patient Assessed. Created space and opportunity for patient  and family to explore thoughts and feelings regarding current medical situation.  Today's Discussion: Today I met with the patient at the bedside, no family was present.  She described that she had a bad night last night.  She could not sleep very well which she anticipates was due to her oxygen being off.  When oxygen was replaced she fell asleep promptly.  They also gave her some to help  sleep but she does not think that it really did much for her.  She thinks that the oxygen was left off because of an error with the respiratory therapist where they gave her her breathing treatment and then forgot to replace the oxygen in her nose.  I told her I would speak with the nurse to pass on to night shift to please ensure that her oxygen is on in the evening times that she can sleep better.  We confirmed that we are still waiting for further data including planned transesophageal echocardiogram to check for vegetation as CT transthoracic echocardiogram did not see any.  However, oncology is planning to reevaluate her for candidacy for this early next week.  In the meantime we will continue to treat her medically.  I provided emotional and general support through therapeutic listening, empathy, sharing of stories, and other techniques.  I answered all questions and addressed all concerns to the best of my ability.  Review of Systems  Constitutional:  Positive for fatigue.       Had a bad night last night, difficulty sleeping  Respiratory:  Negative for cough and shortness of breath.   Cardiovascular:  Negative for chest pain.  Gastrointestinal:  Negative for abdominal pain, nausea and vomiting.   Objective:   Vital Signs:  BP 126/78 (BP Location: Right Leg)   Pulse 99   Temp 98.5 F (36.9 C)   Resp 18   Ht _0  (1.626 m)   Wt (!) 208.2 kg   SpO2 96%   BMI 78.79 kg/m   Physical Exam: Physical Exam Vitals and nursing note reviewed.  Constitutional:      General: She is  not in acute distress.    Appearance: She is ill-appearing.  HENT:     Head: Normocephalic and atraumatic.  Cardiovascular:     Rate and Rhythm: Normal rate.  Pulmonary:     Effort: Pulmonary effort is normal. No respiratory distress.  Abdominal:     General: Abdomen is protuberant.     Palpations: Abdomen is soft.  Skin:    General: Skin is warm and dry.     Comments: Noted dressing LUE to area with  previous blistering from allergic reaction  Neurological:     General: No focal deficit present.     Mental Status: She is alert.  Psychiatric:        Mood and Affect: Mood normal.        Behavior: Behavior normal.    Palliative Assessment/Data: 30%   Assessment & Plan:   Impression: Present on Admission:  Multifocal pneumonia  HTN (hypertension)  Hematuria  Iron deficiency anemia  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HCC)  Pulmonary embolism (HCC)  Urinary retention  61 year old female with multiple chronic comorbidities now admitted with severe MRSA sepsis for which ID is on board, chronic right heart failure, morbid obesity, severe PVD.  She is not a surgical candidate given her sepsis at this time.  She seems quite determined to try to regain some independence.  This will restore be a Krystal Francis and difficult turning but again she seems committed.  Overall prognosis is guarded to poor given her right heart failure and severe sepsis.  SUMMARY OF RECOMMENDATIONS   Wishes to remain full code Continue to treat the treatable/full scope of offered treatment options Continue emotional support of patient Arrange for lower to the ground bed at home for possible She is amendable to SNF/rehab if appropriate Please call us if significant change or further needs any  Symptom Management:  Per primary team PMT is available to assist as needed  Code Status: Full code  Prognosis: Unable to determine  Discharge Planning: To Be Determined  Discussed with: Patient, medical team, nursing team  Thank you for allowing [place] to participate in the care of Krystal Francis PMT will continue to support holistically.  Billing based on MDM: High  Problems Addressed: One acute or chronic illness or injury that poses a threat to life or bodily function  Amount and/or Complexity of Data: Category 1:Review of prior external note(s) from each unique source and Review of the result(s) of each unique test and Category  3:Discussion of management or test interpretation with external physician/other qualified health care professional/appropriate source (not separately reported) (reviewed TTE results, previous family medicine notes, today's CBC and BMP)  Risks: N/A   Walden Field, NP Palliative Medicine Team  Team Phone # [phone] (Nights/Weekends)  12/28/2020, 8:17 AM

## 2021-09-24 DIAGNOSIS — J189 Pneumonia, unspecified organism: Secondary | ICD-10-CM | POA: Diagnosis not present

## 2021-09-24 LAB — BASIC METABOLIC PANEL
Anion gap: 6 (ref 5–15)
BUN: 20 mg/dL (ref 6–20)
CO2: 26 mmol/L (ref 22–32)
Calcium: 8.3 mg/dL — ABNORMAL LOW (ref 8.9–10.3)
Chloride: 104 mmol/L (ref 98–111)
Creatinine, Ser: 0.43 mg/dL — ABNORMAL LOW (ref 0.44–1.00)
GFR, Estimated: >60 mL/min (ref >60)
Glucose, Bld: 124 mg/dL — ABNORMAL HIGH (ref 70–99)
Potassium: 3.9 mmol/L (ref 3.5–5.1)
Sodium: 136 mmol/L (ref 135–145)

## 2021-09-24 LAB — CBC
HCT: 27.1 % — ABNORMAL LOW (ref 36.0–46.0)
Hemoglobin: 7.9 g/dL — ABNORMAL LOW (ref 12.0–15.0)
MCH: 21.9 pg — ABNORMAL LOW (ref 26.0–34.0)
MCHC: 29.2 g/dL — ABNORMAL LOW (ref 30.0–36.0)
MCV: 75.1 fL — ABNORMAL LOW (ref 80.0–100.0)
Platelets: 246 10*3/uL (ref 150–400)
RBC: 3.61 MIL/uL — ABNORMAL LOW (ref 3.87–5.11)
RDW: 22.7 % — ABNORMAL HIGH (ref 11.5–15.5)
WBC: 12.7 10*3/uL — ABNORMAL HIGH (ref 4.0–10.5)
nRBC: 4.3 % — ABNORMAL HIGH (ref 0.0–0.2)

## 2021-09-24 LAB — CULTURE, BLOOD (ROUTINE X 2)
Culture: NO GROWTH
Special Requests: ADEQUATE

## 2021-09-24 LAB — GLUCOSE, CAPILLARY: Glucose-Capillary: 117 mg/dL — ABNORMAL HIGH (ref 70–99)

## 2021-09-24 LAB — MAGNESIUM: Magnesium: 1.8 mg/dL (ref 1.7–2.4)

## 2021-09-24 MED ORDER — TRIAMCINOLONE 0.1 % CREAM:EUCERIN CREAM 1:1
TOPICAL_CREAM | Freq: Two times a day (BID) | CUTANEOUS | Status: DC | PRN
  Filled 2021-09-24: qty 1

## 2021-09-24 NOTE — Progress Notes (Signed)
Morning medications administered to pt.  PRN pain medication administered.  PT planning on working with pt shortly.  RN attempted to educate pt on working with PT/OT.  Pt quickly became irritated and stated that she was "not working with PT/OT until Monday" and she is "not going home anytime soon so what's the point of working with them."  RN stated that PT/OT may be able to help her do some ADLs (wash face, brush teeth, positioning self) and pt continued to argue back saying she can do all that herself.  RN apologized and said it was only a suggestion and RN was only trying to help and not make her mad.  The pt said the RN needed to "stop suggesting working with them [PT/OT]".  PT/OT made aware.

## 2021-09-24 NOTE — Progress Notes (Signed)
OT Cancellation Note  Patient Details Name: Krystal Francis MRN: [id number] DOB: 02-01-1961   Cancelled Treatment:    Reason Eval/Treat Not Completed: Patient declined, no reason specified. Attempted to see pt with PT for therapy eval. Pt in bed, stating she will not mobilize with therapy until Monday. Pt educated on importance of mobility to prevent future decline and assist with discharge planning. Pt continues to declined participation however states seh will partcipate on Monday (not on Sunday). Will follow up on Monday, pt educated that therapy will sign off after 3 failed attempts. Pt verbalized understanding.   Shanon Payor, OTD OTR/L  09/24/21, 12:35 PM

## 2021-09-24 NOTE — Progress Notes (Signed)
PT Cancellation Note  Patient Details Name: Krystal Francis MRN: [id number] DOB: Jul 15, 1960   Cancelled Treatment:    Reason Eval/Treat Not Completed: Patient declined, no reason specified Attempted to see pt with OT for therapy evaluation. Pt received in bed already irritated at therapists arrival. PT & OT educated pt on importance of mobility to prevent further functional decline, assess current level of mobility, and assist with d/c planning. Pt continues to decline participation, also reporting she will not participate tomorrow but will be ready to "do whatever you want' on Monday. Will hold attempt tomorrow & f/u with evaluation attempt on Monday. Pt educated that therapy will sign off after 3 failed attempts & verbalized understanding.  Lavone Nian, PT, DPT 09/24/21, 9:56 AM    [name] 09/24/2021, 9:54 AM

## 2021-09-24 NOTE — Progress Notes (Signed)
Progress Note   Patient: Krystal Francis [id number]:[id number] DOB: 1960/06/28 DOA: 09/15/2021     7 DOS: the patient was seen and examined on 09/24/2021   Brief hospital course: Krystal Francis is a 61 y.o. female with medical history significant of RV failure, hypertension, GERD, PE on Lovenox, iron deficiency anemia, urinary retention with Foley catheter placement, uterine bleeding, morbid obesity with BMI 78.27, who presents with shortness of breath. Patient had a significant tachycardia, tachypnea, leukocytosis and hypoxemia with oxygen saturation 87%, she met severe sepsis criteria. Her blood culture positive for MRSA, CT angiogram showed multifocal pneumonia with evidence of pulmonary hypertension. She was seen by ID, vancomycin was started. Patient also has a severe right-sided systolic dysfunction, she developed hypotension on 5/22, she was given albumin, she was started on midodrine.  Repeated blood culture on 5/22 still positive for staph.  Assessment and Plan: Severe sepsis secondary to multifocal pneumonia. Multifocal pneumonia secondary to MRSA. MRSA septicemia. Acute hypoxemic respiratory failure secondary to pneumonia. Ruled in, POA Repeated blood culture drawn on 5/22 still positive for staph.   --ID consulted --TTE no mention of thrombus Plan: --cont IV vanc --due to pt's habitus and oxygen requirement, cardio will re-eval next week for safety of performing TEE  Hypotension  --partially due to severe right-sided congestive heart failure.  She was giving albumin, midodrine.  She was briefly treated in ICU, was started on IV solumedrol.  Blood pressure better, transfer patient back to PCU on 5/23.  Solumedrol d/c'ed.  Recent pulmonary emboli --on tx dose Lovenox PTA, and switched to heparin gtt while inpatient due to concern of hematuria, which has resolved. --cont home lovenox  Right arm swelling 2/2 RUE DVT --pt with severe edema of RUE, and found to have extensive occlusive  thrombus  --cont home Lovenox  Acute blood loss anemia with iron deficient anemia. Gross hematuria secondary to indwelling Foley catheter. Patient hemoglobin has been low, but no additional hematuria. Due to severe right-sided congestive heart failure, pt received 1 unit of PRBC. Plan: --cont iron suppl --monitor Hgb   Hypomagnesemia. Hypokalemia. --monitor and replete PRN  Hyponatremia  Morbid obesity with BMI 78.27.  Chronic right-sided congestive heart failure. Assess daily for diuretic needs.   Left leg severe peripheral arterial disease. Patient has leg pain at rest, duplex ultrasound showed severe peripheral vascular disease.  Patient is a seen by vascular surgery, no surgery is planned due to bacteremia.   --Norco PRN for pain --No need for IV opioids  Contact dermatitis  Was wiped down with CHG wipes per Infectious disease protocol and developed blistering to right arm and elbow on both upper arm and forearm. --wound care per order  Foley cath, POA --Pt presented with Foley cath.  Per pt, Foley cath was inserted for keeping her skin dry, not for retention.  Removed on 5/25.       Subjective:  I discussed with pt the importance of working with PT/OT whenever a session is offered.  Pt acknowledged.    Physical Exam:  Constitutional: NAD, AAOx3 HEENT: conjunctivae and lids normal, EOMI CV: No cyanosis.   RESP: normal respiratory effort, on 3L Neuro: II - XII grossly intact.   Psych: annoyed mood and affect.     Data Reviewed:  Lab results reviewed.  Family Communication:  Disposition: Status is: Inpatient Remains inpatient appropriate because: Severity of disease, IV antibiotics.  Planned Discharge Destination: Skilled nursing facility    Time spent: 25 minutes  Author: Enzo Bi, MD 09/24/2021 5:50 PM  For on call review [email or web address].

## 2021-09-25 DIAGNOSIS — J189 Pneumonia, unspecified organism: Secondary | ICD-10-CM | POA: Diagnosis not present

## 2021-09-25 LAB — CULTURE, BLOOD (ROUTINE X 2): Special Requests: ADEQUATE

## 2021-09-25 LAB — BASIC METABOLIC PANEL
Anion gap: 6 (ref 5–15)
BUN: 18 mg/dL (ref 6–20)
CO2: 26 mmol/L (ref 22–32)
Calcium: 8.1 mg/dL — ABNORMAL LOW (ref 8.9–10.3)
Chloride: 103 mmol/L (ref 98–111)
Creatinine, Ser: 0.43 mg/dL — ABNORMAL LOW (ref 0.44–1.00)
GFR, Estimated: >60 mL/min (ref >60)
Glucose, Bld: 110 mg/dL — ABNORMAL HIGH (ref 70–99)
Potassium: 3.8 mmol/L (ref 3.5–5.1)
Sodium: 135 mmol/L (ref 135–145)

## 2021-09-25 LAB — CBC
HCT: 26.2 % — ABNORMAL LOW (ref 36.0–46.0)
Hemoglobin: 7.6 g/dL — ABNORMAL LOW (ref 12.0–15.0)
MCH: 22 pg — ABNORMAL LOW (ref 26.0–34.0)
MCHC: 29 g/dL — ABNORMAL LOW (ref 30.0–36.0)
MCV: 75.9 fL — ABNORMAL LOW (ref 80.0–100.0)
Platelets: 229 10*3/uL (ref 150–400)
RBC: 3.45 MIL/uL — ABNORMAL LOW (ref 3.87–5.11)
RDW: 23 % — ABNORMAL HIGH (ref 11.5–15.5)
WBC: 12.2 10*3/uL — ABNORMAL HIGH (ref 4.0–10.5)
nRBC: 1.3 % — ABNORMAL HIGH (ref 0.0–0.2)

## 2021-09-25 LAB — VANCOMYCIN, TROUGH: Vancomycin Tr: 10 ug/mL — ABNORMAL LOW (ref 15–20)

## 2021-09-25 LAB — VANCOMYCIN, PEAK: Vancomycin Pk: 38 ug/mL (ref 30–40)

## 2021-09-25 LAB — MAGNESIUM: Magnesium: 1.7 mg/dL (ref 1.7–2.4)

## 2021-09-25 MED ORDER — ACETAMINOPHEN 500 MG PO TABS
1000.0000 mg | ORAL_TABLET | Freq: Three times a day (TID) | ORAL | Status: DC | PRN
  Administered 2021-09-26 – 2021-10-12 (×5): 1000 mg via ORAL
  Filled 2021-09-25 (×8): qty 2

## 2021-09-25 MED ORDER — GUAIFENESIN-DM 100-10 MG/5ML PO SYRP
10.0000 mL | ORAL_SOLUTION | Freq: Four times a day (QID) | ORAL | Status: DC | PRN
Start: 2021-09-25 — End: 2021-10-13
  Filled 2021-09-25: qty 10

## 2021-09-25 MED ORDER — TRAMADOL HCL 50 MG PO TABS
50.0000 mg | ORAL_TABLET | Freq: Four times a day (QID) | ORAL | Status: DC | PRN
  Administered 2021-09-25 – 2021-09-28 (×5): 50 mg via ORAL
  Filled 2021-09-25 (×5): qty 1

## 2021-09-25 NOTE — Progress Notes (Signed)
OT Cancellation Note  Patient Details Name: Krystal Francis MRN: [id number] DOB: 24-Jan-1961   Cancelled Treatment:    Reason Eval/Treat Not Completed: Patient declined, no reason specified. Pt declined OT evaluation for the third consecutive day. OT to complete orders at this time per therapy protocol.   Darleen Crocker, MS, OTR/L , CBIS ascom [phone]  09/25/21, 9:19 AM

## 2021-09-25 NOTE — TOC Progression Note (Signed)
Transition of Care [facility]) - Progression Note    Patient Details  Name: Krystal Francis MRN: [id number] Date of Birth: 06/24/1960  Transition of Care [facility]) CM/SW [place], LCSW Phone Number: 09/25/2021, 2:25 PM  Clinical Narrative:   Patient continues to refuse therapy. CSW spoke to patient. Explained that insurance will likely not approve patient going to rehab if she refuses therapy at the hospital as they look at PT and OT notes to determine approval status. Patient verbalized understanding. Patient states she will do therapy tomorrow 5/29.  She stated her plan is still to go to short term rehab and reported she understands this is only possible if she participates in PT/OT here at the hospital while we try to find a bed for her.     Expected Discharge Plan: Skilled Nursing Facility Barriers to Discharge: Continued Medical Work up, SNF Pending bed offer, Ship broker  Expected Discharge Plan and Services Expected Discharge Plan: [place]                                               Social Determinants of Health (SDOH) Interventions    Readmission Risk Interventions     View : No data to display.

## 2021-09-25 NOTE — Progress Notes (Signed)
Progress Note   Patient: Krystal Francis MSX:[id number] DOB: Sep 15, 1960 DOA: 09/15/2021     8 DOS: the patient was seen and examined on 09/25/2021   Brief hospital course: Krystal Francis is a 61 y.o. female with medical history significant of RV failure, hypertension, GERD, PE on Lovenox, iron deficiency anemia, urinary retention with Foley catheter placement, uterine bleeding, morbid obesity with BMI 78.27, who presents with shortness of breath. Patient had a significant tachycardia, tachypnea, leukocytosis and hypoxemia with oxygen saturation 87%, she met severe sepsis criteria. Her blood culture positive for MRSA, CT angiogram showed multifocal pneumonia with evidence of pulmonary hypertension. She was seen by ID, vancomycin was started. Patient also has a severe right-sided systolic dysfunction, she developed hypotension on 5/22, she was given albumin, she was started on midodrine.  Repeated blood culture on 5/22 still positive for staph.  Assessment and Plan: Severe sepsis secondary to multifocal pneumonia. Multifocal pneumonia secondary to MRSA. MRSA septicemia. Acute hypoxemic respiratory failure secondary to pneumonia. Ruled in, POA Repeated blood culture drawn on 5/22 still positive for staph.   --ID consulted --TTE no mention of thrombus Plan: --cont IV vanc --due to pt's habitus and oxygen requirement, cardio will re-eval next week for safety of performing TEE  Hypotension  --partially due to severe right-sided congestive heart failure.  She was giving albumin, midodrine.  She was briefly treated in ICU, was started on IV solumedrol.  Blood pressure better, transfer patient back to PCU on 5/23.  Solumedrol d/c'ed.  Recent pulmonary emboli --on tx dose Lovenox PTA, and switched to heparin gtt while inpatient due to concern of hematuria, which has resolved. --cont home lovenox  Right arm swelling 2/2 RUE DVT --pt with severe edema of RUE, and found to have extensive occlusive  thrombus  --cont home Lovenox  Acute blood loss anemia with iron deficient anemia. Gross hematuria secondary to indwelling Foley catheter. Patient hemoglobin has been low, but no additional hematuria. Due to severe right-sided congestive heart failure, pt received 1 unit of PRBC. Plan: --cont iron suppl --monitor Hgb   Hypomagnesemia. Hypokalemia. --monitor and replete PRN  Hyponatremia  Morbid obesity with BMI 78.27.  Chronic right-sided congestive heart failure. Assess daily for diuretic needs.   Left leg severe peripheral arterial disease. Patient has leg pain at rest, duplex ultrasound showed suspected significant peripheral vascular disease.  Patient was seen by vascular surgery, no surgery is planned due to bacteremia.   --tramadol PRN --No need for IV opioids  Contact dermatitis  Was wiped down with CHG wipes per Infectious disease protocol and developed blistering to right arm and elbow on both upper arm and forearm. --wound care per order  Foley cath, POA, removed --Pt presented with Foley cath.  Per pt, Foley cath was inserted for keeping her skin dry, not for retention.  Removed on 5/25.       Subjective:  Pt again refused PT/OT.  I explained again to pt that if she keeps refusing PT/OT, then they will sign off, and SNF rehab won't offer her a bed, and she will have to be discharged home when medically ready.     Physical Exam:  Constitutional: NAD, AAOx3 HEENT: conjunctivae and lids normal, EOMI CV: No cyanosis.   RESP: normal respiratory effort, on 2L Neuro: II - XII grossly intact.   Psych: irate mood and affect.     Data Reviewed:  Lab results reviewed.  Family Communication:  Disposition: Status is: Inpatient Remains inpatient appropriate because: Severity of disease, IV antibiotics.  Planned Discharge Destination: Skilled nursing facility    Time spent: 25 minutes  Author: Enzo Bi, MD 09/25/2021 6:17 PM  For on call review  [email or web address].

## 2021-09-25 NOTE — TOC Progression Note (Signed)
Transition of Care [facility]) - Progression Note    Patient Details  Name: Krystal Francis MRN: [id number] Date of Birth: Apr 08, 1961  Transition of Care [facility]) CM/SW [place], LCSW Phone Number: 09/25/2021, 9:09 AM  Clinical Narrative:   Extended bed search.     Expected Discharge Plan: Skilled Nursing Facility Barriers to Discharge: Continued Medical Work up, SNF Pending bed offer, Ship broker  Expected Discharge Plan and Services Expected Discharge Plan: [place]                                               Social Determinants of Health (SDOH) Interventions    Readmission Risk Interventions     View : No data to display.

## 2021-09-25 NOTE — Progress Notes (Signed)
PT Cancellation Note  Patient Details Name: Krystal Francis MRN: [id number] DOB: 1960-09-08   Cancelled Treatment:    Reason Eval/Treat Not Completed: Patient declined, no reason specified Pt notified nurse that she will not participate in PT today but is agreeable to attempts tomorrow. PT will attempt tomorrow & if pt declines PT will sign off 2/2 multiple failed attempts.  Lavone Nian, PT, DPT 09/25/21, 8:16 AM   [name] 09/25/2021, 8:14 AM

## 2021-09-26 DIAGNOSIS — R7881 Bacteremia: Secondary | ICD-10-CM | POA: Diagnosis not present

## 2021-09-26 DIAGNOSIS — J189 Pneumonia, unspecified organism: Secondary | ICD-10-CM | POA: Diagnosis not present

## 2021-09-26 DIAGNOSIS — B9562 Methicillin resistant Staphylococcus aureus infection as the cause of diseases classified elsewhere: Secondary | ICD-10-CM | POA: Diagnosis not present

## 2021-09-26 LAB — CBC
HCT: 26.2 % — ABNORMAL LOW (ref 36.0–46.0)
Hemoglobin: 7.6 g/dL — ABNORMAL LOW (ref 12.0–15.0)
MCH: 21.8 pg — ABNORMAL LOW (ref 26.0–34.0)
MCHC: 29 g/dL — ABNORMAL LOW (ref 30.0–36.0)
MCV: 75.3 fL — ABNORMAL LOW (ref 80.0–100.0)
Platelets: 250 10*3/uL (ref 150–400)
RBC: 3.48 MIL/uL — ABNORMAL LOW (ref 3.87–5.11)
RDW: 23.8 % — ABNORMAL HIGH (ref 11.5–15.5)
WBC: 10.2 10*3/uL (ref 4.0–10.5)
nRBC: 0.6 % — ABNORMAL HIGH (ref 0.0–0.2)

## 2021-09-26 LAB — BASIC METABOLIC PANEL
Anion gap: 5 (ref 5–15)
BUN: 13 mg/dL (ref 6–20)
CO2: 26 mmol/L (ref 22–32)
Calcium: 8.3 mg/dL — ABNORMAL LOW (ref 8.9–10.3)
Chloride: 104 mmol/L (ref 98–111)
Creatinine, Ser: 0.5 mg/dL (ref 0.44–1.00)
GFR, Estimated: >60 mL/min (ref >60)
Glucose, Bld: 100 mg/dL — ABNORMAL HIGH (ref 70–99)
Potassium: 4 mmol/L (ref 3.5–5.1)
Sodium: 135 mmol/L (ref 135–145)

## 2021-09-26 LAB — MAGNESIUM: Magnesium: 1.6 mg/dL — ABNORMAL LOW (ref 1.7–2.4)

## 2021-09-26 MED ORDER — VANCOMYCIN HCL 2000 MG/400ML IV SOLN
2000.0000 mg | INTRAVENOUS | Status: DC
  Administered 2021-09-26 – 2021-09-27 (×2): 2000 mg via INTRAVENOUS
  Filled 2021-09-26 (×3): qty 400

## 2021-09-26 MED ORDER — TRIAMCINOLONE ACETONIDE 0.1 % EX CREA
TOPICAL_CREAM | Freq: Two times a day (BID) | CUTANEOUS | Status: DC | PRN
  Filled 2021-09-26: qty 15

## 2021-09-26 NOTE — Progress Notes (Signed)
Progress Note   Patient: Krystal Francis BSJ:[id number] DOB: 1960/05/02 DOA: 09/15/2021     9 DOS: the patient was seen and examined on 09/26/2021   Brief hospital course: Krystal Francis is a 61 y.o. female with medical history significant of RV failure, hypertension, GERD, PE on Lovenox, iron deficiency anemia, urinary retention with Foley catheter placement, uterine bleeding, morbid obesity with BMI 78.27, who presents with shortness of breath. Patient had a significant tachycardia, tachypnea, leukocytosis and hypoxemia with oxygen saturation 87%, she met severe sepsis criteria. Her blood culture positive for MRSA, CT angiogram showed multifocal pneumonia with evidence of pulmonary hypertension. She was seen by ID, vancomycin was started. Patient also has a severe right-sided systolic dysfunction, she developed hypotension on 5/22, she was given albumin, she was started on midodrine.  Repeated blood culture on 5/22 still positive for staph.  Assessment and Plan: Severe sepsis secondary to multifocal pneumonia. Multifocal pneumonia secondary to MRSA. MRSA septicemia. Acute hypoxemic respiratory failure secondary to pneumonia. Ruled in, POA Repeated blood culture drawn on 5/22 and 5/25 still positive for staph.   --ID consulted --TTE no mention of thrombus Plan: --cont IV vanc --due to pt's habitus and oxygen requirement, cardio will re-eval next week for safety of performing TEE --Vascular consult for possible thrombectomy of RUE DVT (for concern of MRSA seeding)  Hypotension  --partially due to severe right-sided congestive heart failure.  She was giving albumin, midodrine.  She was briefly treated in ICU, was started on IV solumedrol.  Blood pressure better, transfer patient back to PCU on 5/23.  Solumedrol d/c'ed.  Recent pulmonary emboli --on tx dose Lovenox PTA, and switched to heparin gtt while inpatient due to concern of hematuria, which has resolved. --cont home lovenox  Right arm  swelling 2/2 RUE DVT --pt with severe edema of RUE, and found to have extensive occlusive thrombus  --cont home Lovenox --Vascular consult for possible thrombectomy of RUE DVT (for concern of MRSA seeding)  Acute blood loss anemia with iron deficient anemia. Gross hematuria secondary to indwelling Foley catheter. Patient hemoglobin has been low, but no additional hematuria. Due to severe right-sided congestive heart failure, pt received 1 unit of PRBC. Plan: --cont iron suppl --Monitor Hgb   Hypomagnesemia. Hypokalemia. --monitor and replete PRN  Hyponatremia  Morbid obesity with BMI 78.27.  Chronic right-sided congestive heart failure. Assess daily for diuretic needs.   Left leg severe peripheral arterial disease. Patient has leg pain at rest, duplex ultrasound showed suspected significant peripheral vascular disease.  Patient was seen by vascular surgery, no surgery is planned due to bacteremia.   --tramadol PRN --No need for IV opioids  Contact dermatitis  Was wiped down with CHG wipes per Infectious disease protocol and developed blistering to right arm and elbow on both upper arm and forearm. --wound care per order  Foley cath, POA, removed --Pt presented with Foley cath.  Per pt, Foley cath was inserted for keeping her skin dry, not for retention.  Removed on 5/25.       Subjective:  Pt now agreeable to PT.    Unable to clear pt's blood cx.  ID concerned about RUE thrombus seeded with MRSA, therefore, vascular consulted for possible thrombectomy.   Physical Exam:  Constitutional: NAD, AAOx3 HEENT: conjunctivae and lids normal, EOMI CV: No cyanosis.   RESP: normal respiratory effort, on 1L Neuro: II - XII grossly intact.     Data Reviewed:  Lab results reviewed.  Family Communication:  Disposition: Status is: Inpatient Remains  inpatient appropriate because: Severity of disease, IV antibiotics.  Planned Discharge Destination: Skilled nursing  facility    Time spent: 35 minutes  Author: Enzo Bi, MD 09/26/2021 6:57 PM  For on call review [email or web address].

## 2021-09-26 NOTE — Consult Note (Signed)
Pharmacy Antibiotic Note  Krystal Francis is a 61 y.o. female w/ h/o RV failure, HTN, GERD, PE PTA on Lovenox, iron deficiency anemia, urinary retention with Foley catheter placement, uterine bleeding, morbid obesity with BMI 78.27, who presents with shortness of breath > w/u c/w PE & multifocal PNA. Pt admitted on 09/15/2021 with pneumonia and bacteremia & BCID resulted with MRSA 4 of 4 vials on 5/20.  Pharmacy has been consulted for escalation of abx to Vancomycin dosing.   -planning 6 weeks IV Vancomycin per ID  Day # 10 vancomycin MRSA bacteremia Renal: SCr stable Repeat blood cxs 5/25: 1 still with Staph aureus  Vancomycin levels: 5/27 Vanc '1750mg'$  hung'@2222'$  -infuse over 2 hrs 5/28  Vanc P'@0023'$ =38  mcg/ml          Vanc T'@2044'$ =10 mcg/ml   Plan:    Will increase vancomycin  to 2000 mg IV every 24 hours. F/u to check levels AUC:560 Cmin; 10.3    Height: '5\' 4"'$  (162.6 cm) Weight: (!) 208.2 kg (459 lb) IBW/kg (Calculated) : 54.7  Temp (24hrs), Avg:98.3 F (36.8 C), Min:98 F (36.7 C), Max:98.7 F (37.1 C)  Recent Labs  Lab 09/19/21 2141 09/20/21 0504 09/21/21 0705 09/21/21 2126 09/22/21 0706 09/23/21 0421 09/24/21 0351 09/25/21 0023 09/25/21 2044 09/26/21 0352  WBC  --  19.7*   < >  --  18.0* 15.2* 12.7* 12.2*  --  10.2  CREATININE  --  0.73   < >  --  0.68 0.54 0.43* 0.43*  --  0.50  LATICACIDVEN  --  1.5  --   --   --   --   --   --   --   --   VANCOTROUGH  --  30*  --   --   --   --   --   --  10*  --   VANCOPEAK 39  --   --   --   --   --   --  38  --   --   VANCORANDOM  --   --   --  15  --   --   --   --   --   --    < > = values in this interval not displayed.     Estimated Creatinine Clearance: 138.8 mL/min (by C-G formula based on SCr of 0.5 mg/dL).    Allergies  Allergen Reactions   Furosemide Rash    Per [facility] records - rash possibly to IV furosemide but tablets ok   Chlorhexidine Hives, Dermatitis, Rash and Other (See Comments)    Blisters right after CHG  wipe down    Antimicrobials this admission: Azithro (5/20-5/21) CRO x1 in ED (5/20) VAN (5/20 >>   Dose adjustments this admission: 5/22: Vancomycin levels  on 1gm IV q12h (with 5th maintenance dose Vancomycin peak 5/22 at 2141 = 39 mcg/mL Vancomycin trough 5/23 at 0504 = 30 mcg/mL Vanc adj to '1750mg'$  IV q24h  5/24 Vanc random'@2126'$ = 15 mcg/ml   No chg  5/27 Vanc '1750mg'$  hung'@2222'$  -infuse over 2 hrs         5/28   Vanc P'@0023'$ =38  mcg/ml         5/28   Vanc T'@2044'$ =10 mcg/ml        -Vanc adj to 2000 mg IV q24h   Microbiology results: 5/20 BCx: BCID 4 of 4 vials MRSA 5/22 BCx: S aureus, MRSA 5/25: Bcx: L hand 2150: NG  Bcx: L hand 2204: +Staph aureus  5/20 Sputum: MRSA 5/20 Flu/Cov PCR: negative    Thank you for allowing pharmacy to be a part of this patient's care.  Chinita Greenland PharmD Clinical Pharmacist 09/26/2021

## 2021-09-26 NOTE — Evaluation (Signed)
Physical Therapy Re-Evaluation Patient Details Name: Krystal Francis MRN: [id number] DOB: Nov 07, 1960 Today's Date: 09/26/2021  History of Present Illness  Pt is a 61 y/o F who presents with hematuria from foley catheter over the last couple of days. Pt had tachycardia, tachypnea, leukocytosis & hypoxemia & met severe sepsis criteria. Pt developed hypotension on 09/19/21 & was transferred to a higher level of care. PT has now been consulted again for PT re-evaluation. PMH: morbid obesity, DVT, PE on lovenox, HTN, LE lymphadema, abnormal uterine bleeding, anemia  Clinical Impression  Pt seen for PT re-evaluation with pt not pleased that PT is in room again. Pt reports "what do you want me to do now?" & pt reports she is going home instead of to SNF. Pt reports she only needs a bed that goes lower to the ground & she can return home. PT attempts to educate pt that this PT cannot determine what bed she receives at home and the hospital bed she is issued may very likely may not have as many features as the beds in the hospital. PT educates pt that if she cannot get a bed that goes low enough then PT will work with pt to get her stronger so she can use a traditional bed. Pt does initiate supine>sit with HOB elevated & air out of mattress but is unable to completley come to sitting EOB & requires assistance to place RLE back on bed. To attempt mobility pt does use UE to LLE towards EOB a little at a time. Will attempt to see pt again while in acute setting to increase independence with mobility as pt is agreeable. All goals remain appropriate at this time.     Recommendations for follow up therapy are one component of a multi-disciplinary discharge planning process, led by the attending physician.  Recommendations may be updated based on patient status, additional functional criteria and insurance authorization.  Follow Up Recommendations Skilled nursing-short term rehab (<3 hours/day)    Assistance Recommended  at Discharge Frequent or constant Supervision/Assistance  Patient can return home with the following  Two people to help with walking and/or transfers;Two people to help with bathing/dressing/bathroom;Help with stairs or ramp for entrance;Assist for transportation;Assistance with cooking/housework;Direct supervision/assist for financial management    Equipment Recommendations None recommended by PT  Recommendations for Other Services       Functional Status Assessment Patient has had a recent decline in their functional status and demonstrates the ability to make significant improvements in function in a reasonable and predictable amount of time.     Precautions / Restrictions Precautions Precautions: Fall Restrictions Weight Bearing Restrictions: No      Mobility  Bed Mobility               General bed mobility comments: Pt initiates supine>sit with HOB elevated, bed rails & supervision. Pt declines PT assistance for supine>sit but PT does remove all air from bed mattress to increase ease of movement. Pt unable to fully come to sitting EOB but does move R foot off of EOB & requires assistance to place it back on at end of session.    Transfers                        Ambulation/Gait                  Stairs            Wheelchair Mobility    Modified Rankin (Stroke  Patients Only)       Balance                                             Pertinent Vitals/Pain Pain Assessment Pain Assessment: No/denies pain    Home Living Family/patient expects to be discharged to:: Private residence Living Arrangements: Alone Available Help at Discharge: Family;Available PRN/intermittently Type of Home: Apartment Home Access: Level entry       Home Layout: One level Home Equipment: Conservation officer, nature (2 wheels);Hospital bed;Shower seat      Prior Function Prior Level of Function : Needs assist             Mobility Comments: Pt  reports she was mod I with rollator prior to admission to [facility]. Pt reports she left [facility] ambulatory with RW but once she returned home she was physically unable to get back in bed after getting out so she stayed in bed all the time. Her mother & another family member would come by PRN to assist with peri hygiene & meals.       Hand Dominance        Extremity/Trunk Assessment   Upper Extremity Assessment Upper Extremity Assessment: Generalized weakness    Lower Extremity Assessment Lower Extremity Assessment: Generalized weakness (pt with LE lymphadema)       Communication   Communication: No difficulties  Cognition Arousal/Alertness: Awake/alert Behavior During Therapy: WFL for tasks assessed/performed Overall Cognitive Status: Within Functional Limits for tasks assessed                                          General Comments      Exercises     Assessment/Plan    PT Assessment Patient needs continued PT services  PT Problem List Decreased strength;Decreased coordination;Cardiopulmonary status limiting activity;Decreased range of motion;Pain;Decreased activity tolerance;Decreased knowledge of use of DME;Decreased safety awareness;Decreased balance;Obesity;Decreased mobility;Decreased knowledge of precautions;Decreased skin integrity       PT Treatment Interventions Therapeutic exercise;DME instruction;Gait training;Balance training;Stair training;Neuromuscular re-education;Modalities;Manual techniques;Functional mobility training;Patient/family education;Therapeutic activities    PT Goals (Current goals can be found in the Care Plan section)  Acute Rehab PT Goals Patient Stated Goal: go home, get bed that goes lower to the floor PT Goal Formulation: With patient Time For Goal Achievement: 09/30/21 Potential to Achieve Goals: Fair    Frequency Min 2X/week     Co-evaluation               AM-PAC PT "6 Clicks" Mobility   Outcome Measure Help needed turning from your back to your side while in a flat bed without using bedrails?: A Lot Help needed moving from lying on your back to sitting on the side of a flat bed without using bedrails?: Total Help needed moving to and from a bed to a chair (including a wheelchair)?: Total Help needed standing up from a chair using your arms (e.g., wheelchair or bedside chair)?: Total Help needed to walk in hospital room?: Total Help needed climbing 3-5 steps with a railing? : Total 6 Click Score: 7    End of Session   Activity Tolerance: Patient tolerated treatment well Patient left: in bed;with call bell/phone within reach   PT Visit Diagnosis: Muscle weakness (generalized) (M62.81);Difficulty in walking, not  elsewhere classified (R26.2);Other abnormalities of gait and mobility (R26.89)    Time: 1314-3888 PT Time Calculation (min) (ACUTE ONLY): 19 min   Charges:   PT Evaluation $PT Eval Moderate Complexity: [place], PT, DPT 09/26/21, 10:07 AM   [name] 09/26/2021, 10:03 AM

## 2021-09-26 NOTE — TOC Progression Note (Signed)
Transition of Care [facility]) - Progression Note    Patient Details  Name: Krystal Francis MRN: [id number] Date of Birth: Aug 04, 1960  Transition of Care [facility]) CM/SW [place], RN Phone Number: 09/26/2021, 12:24 PM  Clinical Narrative:   No bed offers at this time, patient has agreed to participate with therapy.  RNCM will send therapy notes to facilities once participation is verified and notes are in chart.    Expected Discharge Plan: Skilled Nursing Facility Barriers to Discharge: Continued Medical Work up, SNF Pending bed offer, Ship broker  Expected Discharge Plan and Services Expected Discharge Plan: [place]                                               Social Determinants of Health (SDOH) Interventions    Readmission Risk Interventions     View : No data to display.

## 2021-09-26 NOTE — Progress Notes (Signed)
   Date of Admission:  09/15/2021   Krystal Francis is a 61 y.o. female Principal Problem:   Multifocal pneumonia Active Problems:   Morbid obesity with body mass index of 70 and over in adult [facility])   HTN (hypertension)   Severe sepsis (HCC)   Hematuria   Iron deficiency anemia   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[place])   Pulmonary embolism (HCC)   Urinary retention   MRSA bacteremia   Hypomagnesemia   Acute hypoxemic respiratory failure (HCC)   Arterial hypotension   Palliative care by specialist   Goals of care, counseling/discussion    Subjective: Patient is feeling better Still right arm swollen Medications:   enoxaparin (LOVENOX) injection  120 mg Subcutaneous Q12H   feeding supplement  237 mL Oral BID BM   iron polysaccharides  150 mg Oral Daily   magnesium oxide  400 mg Oral Daily   medroxyPROGESTERone  10 mg Oral BID   multivitamin with minerals  1 tablet Oral Daily   mupirocin ointment  1 application. Nasal BID   pantoprazole  40 mg Oral Daily   senna-docusate  2 tablet Oral BID   silver sulfADIAZINE   Topical BID    Objective: Vital signs in last 24 hours: Temp:  [98 F (36.7 C)-98.7 F (37.1 C)] 98.3 F (36.8 C) (05/29 2012) Pulse Rate:  [96-99] 96 (05/29 2012) Resp:  [16-20] 20 (05/29 2012) BP: (108-124)/(51-70) 113/65 (05/29 2012) SpO2:  [91 %-95 %] 92 % (05/29 2012)    PHYSICAL EXAM:  General: Alert, cooperative, no distress at rest , appears stated age. Morbid obesity Lungs: b/l air entry Heart: Regular rate and rhythm, no murmur, rub or gallop. Abdomen: Soft, non-tender,not distended. Bowel sounds normal. No masses Extremities:rt arm swollen- deroofed blisters Skin: AGEP much better Lymph: Cervical, supraclavicular normal. Neurologic: Grossly non-focal  Lab Results Recent Labs    09/25/21 0023 09/26/21 0352  WBC 12.2* 10.2  HGB 7.6* 7.6*  HCT 26.2* 26.2*  NA 135 135  K 3.8 4.0  CL 103 104  CO2 26 26  BUN 18 13  CREATININE 0.43* 0.50      Microbiology: 09/17/21 4/4 MRSA 09/19/21- MRSA bacteremia 2/4 blood culture 09/22/21 MRSA    Assessment/Plan:  MRSA bacteremia.  Patient had central line at [facility] for 4 weeks.  Also has extensive thrombosis of the right upper extremity.  Very likely the thrombus is being seeded.  Because of persistent bacteremia from 5/20 until 09/22/2021 she very likely will need thrombectomy.  She is on IV vancomycin.  The levels will have to be adjusted.  Initially it was very high and then it has been low.  The initial plan was to get a TEE as she had bilateral pulmonary infiltrates to rule out infective endocarditis.  But Cardiology concerned about her risk of morbid obesity and being on oxygen. Continue vancomycin Will need for minimum 6 weeks  Repeat blood culture to see clearance  Right upper extremity DVT.  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  Anemia with history of recent vaginal bleeding.  Has been on Provera Needed multiple blood transfusion in March 2023  Recent PE with right strain Foley catheter removed and patient has pure wick  Lymphedema legs.  Left worse than the right.  Discussed the management with the patient and the hospitalist..

## 2021-09-27 DIAGNOSIS — B9562 Methicillin resistant Staphylococcus aureus infection as the cause of diseases classified elsewhere: Secondary | ICD-10-CM | POA: Diagnosis not present

## 2021-09-27 DIAGNOSIS — J189 Pneumonia, unspecified organism: Secondary | ICD-10-CM | POA: Diagnosis not present

## 2021-09-27 DIAGNOSIS — I82721 Chronic embolism and thrombosis of deep veins of right upper extremity: Secondary | ICD-10-CM

## 2021-09-27 DIAGNOSIS — R7881 Bacteremia: Secondary | ICD-10-CM | POA: Diagnosis not present

## 2021-09-27 LAB — CULTURE, BLOOD (ROUTINE X 2)
Culture: NO GROWTH
Special Requests: ADEQUATE

## 2021-09-27 MED ORDER — POLYETHYLENE GLYCOL 3350 17 G PO PACK: 17.0000 g | PACK | Freq: Two times a day (BID) | ORAL | Status: DC | PRN

## 2021-09-27 MED ORDER — MAGNESIUM SULFATE 2 GM/50ML IV SOLN
2.0000 g | Freq: Once | INTRAVENOUS | Status: AC
  Administered 2021-09-27: 2 g via INTRAVENOUS
  Filled 2021-09-27: qty 50

## 2021-09-27 MED ORDER — DOCUSATE SODIUM 100 MG PO CAPS
100.0000 mg | ORAL_CAPSULE | Freq: Two times a day (BID) | ORAL | Status: DC | PRN
Start: 2021-09-27 — End: 2021-10-13
  Administered 2021-09-27: 100 mg via ORAL
  Filled 2021-09-27: qty 1

## 2021-09-27 NOTE — H&P (View-Only) (Signed)
[facility]  Daily Progress Note   Subjective  -    Krystal Francis is a 61 year old female with a significant past medical history of hypertension, GERD, pulmonary embolism, anemia and morbid obesity who presented with shortness of breath, tachycardia, tachypnea and leukocytosis and was found to meet severe sepsis criteria.  Her blood cultures were positive for MRSA and she was found to have multifocal pneumonia.  During this admission she also had extensive right upper extremity swelling and was found to have an extensive right upper extremity thrombus.  The patient has continued to have positive blood cultures despite an extensive timeframe of IV antibiotics.  There is concern that the thrombus in her right upper extremity may be present with infection thus preventing clearing of her bacteremia.  Notes that the swelling began approximately 2 to 3 days prior to her admission on 09/17/2021    Objective Vitals:   09/27/21 0534 09/27/21 0858 09/27/21 1717 09/27/21 2034  BP: (!) 120/49 (!) 116/55 127/68 117/65  Pulse: 92 (!) 103 87 99  Resp: '18 20 17 18  '$ Temp: 98.1 F (36.7 C) 98 F (36.7 C) 98.8 F (37.1 C) 98 F (36.7 C)  TempSrc:      SpO2: 97% 94% 96% 97%  Weight:      Height:        Intake/Output Summary (Last 24 hours) at 09/27/2021 2234 Last data filed at 09/27/2021 1349 Gross per 24 hour  Intake 400 ml  Output 1550 ml  Net -1150 ml    PULM  CTAB CV  RRR VASC  right upper extremity with 2+ edema  Laboratory CBC    Component Value Date/Time   WBC 10.2 09/26/2021 0352   HGB 7.6 (L) 09/26/2021 0352   HCT 26.2 (L) 09/26/2021 0352   PLT 250 09/26/2021 0352    BMET    Component Value Date/Time   NA 135 09/26/2021 0352   K 4.0 09/26/2021 0352   CL 104 09/26/2021 0352   CO2 26 09/26/2021 0352   GLUCOSE 100 (H) 09/26/2021 0352   BUN 13 09/26/2021 0352   CREATININE 0.50 09/26/2021 0352   CALCIUM 8.3 (L) 09/26/2021 0352   GFRNONAA >60 09/26/2021  0352    Assessment/Planning Right upper extremity thrombus The patient notes that her symptoms began approximately 2 weeks ago or so.  Based on this it is possible that the thrombus has begun to organize becoming chronic in nature, which will decrease the effectiveness of the thrombectomy.  However, if the patient has seeded this thrombus removing as much as possible will possibly help her to clear her bacteremia.  I have discussed the risk, benefits and alternatives with the patient in detail.  She is agreeable to proceed.  Left lower extremity PAD  The patient does not have symptoms of worsening ischemia.  Given the bacteremia we will delay intervention until she has negative blood cultures as we do not want any possible stents or interventions possibly being seeded.  Kris Hartmann  09/27/2021, 10:34 PM

## 2021-09-27 NOTE — Progress Notes (Signed)
PT Cancellation Note  Patient Details Name: Krystal Francis MRN: [id number] DOB: 1960/07/19   Cancelled Treatment:    Reason Eval/Treat Not Completed: Other (comment).  Pt resting in bed upon PT arrival.  [facility] had contacted therapist regarding concerns about pt getting back into bed at home (d/t high bed height).  Attempted to discuss concerns and problem solve with pt but pt reporting that no one was listening to her and even though pt talked about using step to get into bed, pt refused to trial step with therapist.  Pt reporting she just needed a lower bed and would figure out things when she got home (pt declined to participate in therapy).  TOC and nurse updated on above.  Leitha Bleak, PT 09/27/21, 5:33 PM

## 2021-09-27 NOTE — Progress Notes (Signed)
  Progress Note   Patient: Krystal Francis GYI:[id number] DOB: 03/01/1961 DOA: 09/15/2021     10 DOS: the patient was seen and examined on 09/27/2021   Brief hospital course: Krystal Francis is a 61 y.o. female with medical history significant of RV failure, hypertension, GERD, PE on Lovenox, iron deficiency anemia, urinary retention with Foley catheter placement, uterine bleeding, morbid obesity with BMI 78.27, who presents with shortness of breath. Patient had a significant tachycardia, tachypnea, leukocytosis and hypoxemia with oxygen saturation 87%, she met severe sepsis criteria. Her blood culture positive for MRSA, CT angiogram showed multifocal pneumonia with evidence of pulmonary hypertension. She was seen by ID, vancomycin was started.  Assessment and Plan: Severe sepsis secondary to multifocal pneumonia. Multifocal pneumonia secondary to MRSA. MRSA septicemia. Acute hypoxemic respiratory failure secondary to pneumonia. Ruled in, POA Repeated blood culture drawn on 5/22 and 5/25 still positive for staph.   --ID consulted --TTE no mention of thrombus Plan: --cont IV vanc --due to pt's habitus and oxygen requirement, cardio will re-eval for safety of performing TEE when pt is closer to discharge --Vascular consulted for possible thrombectomy of RUE DVT (for concern of MRSA seeding)  Hypotension  --partially due to severe right-sided congestive heart failure.  She was giving albumin, midodrine.  She was briefly treated in ICU, was started on IV solumedrol.  Blood pressure better, transfer patient back to PCU on 5/23.  Solumedrol d/c'ed.  Recent pulmonary emboli --on tx dose Lovenox PTA, and switched to heparin gtt while inpatient due to concern of hematuria, which has resolved. --cont home lovenox  Right arm swelling 2/2 RUE DVT --pt with severe edema of RUE, and found to have extensive occlusive thrombus  --cont home Lovenox --Vascular consulted for possible thrombectomy of RUE DVT (for  concern of MRSA seeding)  Acute blood loss anemia with iron deficient anemia. Gross hematuria secondary to indwelling Foley catheter. Patient hemoglobin has been low, but no additional hematuria. Due to severe right-sided congestive heart failure, pt received 1 unit of PRBC. Plan: --cont iron suppl --Monitor Hgb   Hypomagnesemia. Hypokalemia. --monitor and replete PRN  Hyponatremia  Morbid obesity with BMI 78.27.  Chronic right-sided congestive heart failure. Assess daily for diuretic needs.   Left leg severe peripheral arterial disease. Patient has leg pain at rest, duplex ultrasound showed suspected significant peripheral vascular disease.  Patient was seen by vascular surgery, no surgery is planned due to bacteremia.   --tramadol PRN --No need for IV opioids  Contact dermatitis  Was wiped down with CHG wipes per Infectious disease protocol and developed blistering to right arm and elbow on both upper arm and forearm. --wound care per order  Foley cath, POA, removed --Pt presented with Foley cath.  Per pt, Foley cath was inserted for keeping her skin dry, not for retention.  Removed on 5/25.       Subjective:  Pt requested stool softner today.  No other complaints.   Physical Exam:  Constitutional: NAD, AAOx3 HEENT: conjunctivae and lids normal, EOMI CV: No cyanosis.   RESP: normal respiratory effort, on 1L Neuro: II - XII grossly intact.     Data Reviewed:  Lab results reviewed.  Family Communication:  Disposition: Status is: Inpatient Remains inpatient appropriate because: Severity of disease, IV antibiotics.  Planned Discharge Destination: Skilled nursing facility    Time spent: 25 minutes  Author: Enzo Bi, MD 09/27/2021 9:06 PM  For on call review [email or web address].

## 2021-09-27 NOTE — TOC Progression Note (Signed)
Transition of Care [facility]) - Progression Note    Patient Details  Name: Macyn Shropshire MRN: [id number] Date of Birth: [birth year]  Transition of Care [facility]) CM/SW [place], [place] Phone Number: 09/27/2021, 11:11 AM  Clinical Narrative:     CSW spoke with patient to inform of no SNF bed offers despite extensive search due to feedback from facilities reporting they are unable to meet her needs.   Patient reports she does live alone however feels she could go home if her bed can go lower or if she were able to find a step. Reports she can get out of her current bed at home however cannot get herself back into bed.   CSW spoke with Thedore Mins with [employer] who reports their bariatric hospital bed at it's lowest goes to 35 inches.   CSW has relayed this information to PT to work with patient to see if she is able to get into a bed at this heigh. If so, DME can be ordered for new bariatric hospital bed.   Expected Discharge Plan: Skilled Nursing Facility Barriers to Discharge: Continued Medical Work up, SNF Pending bed offer, Ship broker  Expected Discharge Plan and Services Expected Discharge Plan: [place]                                               Social Determinants of Health (SDOH) Interventions    Readmission Risk Interventions     View : No data to display.

## 2021-09-27 NOTE — Progress Notes (Signed)
Nutrition Follow-up  DOCUMENTATION CODES:   Morbid obesity  INTERVENTION:  - Liberalize diet from a heart healthy to a regular to provide widest variety of menu options to enhance nutritional adequacy as pt is not currently meeting her nutritional needs at this time  - Ensure Enlive po BID, each supplement provides 350 kcal and 20 grams of protein.  - MVI with minerals daily  NUTRITION DIAGNOSIS:   Increased nutrient needs related to acute illness as evidenced by estimated needs.  Ongoing  GOAL:   Patient will meet greater than or equal to 90% of their needs  Progressing-addressing needs via meals and nutrition supplements  MONITOR:   PO intake, Supplement acceptance  REASON FOR ASSESSMENT:   Malnutrition Screening Tool    ASSESSMENT:   Pt with medical history significant of RV failure, hypertension, GERD, PE on Lovenox, iron deficiency anemia, urinary retention with Foley catheter placement, uterine bleeding, morbid obesity with BMI 78.27, who presents with shortness of breath.  Per ID c/s, may likely require thrombectomy d/t persistent bacteremia from 05/20-05/25, likely thrombus is seeded.   Pt evaluated by PMT, pt remains full code and full scope of offered treatment options.   Spoke with pt at bedside. She states that she has a decreased but fair appetite. She wishes she could have a salt packet on her tray to add flavor to her foods. Pt has been ordering the same menu items for each meal throughout admission. She has 2 bowls of frosted flakes with (2) 2% milk and an orange (503 kcal, 20g protein) for breakfast everyday. For lunch and dinner she has 3 oz roast [place] with bread dressing and gravy, an orange and cola (389kcal, 20g protein). Occasionally she orders a slice of cake with white icing for dessert with dinner. Pt consuming 1300kcal and 60g protein daily. She states that she is not drinking Ensure as these fill her up and she knows she will not eat lunch or  dinner if she drinks these in between. Spoke with her about the reasoning of nutrition supplements to optimize nutritional intake and help meet her nutrition needs.   Pt not currently receiving medical management for CHF. She would benefit from a liberalized diet to a regular as she is not meeting her nutritional needs at this time and suspect her intake may improve with more menu options.  Reviewed I/O's: -950 ml x24 hours; -13266m since admission  UOP: [id number] hours  No recent meal intakes documented to review.   Reviewed wt hx. Pt's wt noted to be -18.6 kg since 03/22  Edema: moderate pitting RUE  Medications: niferex, mag-ox, MVI, protonix, senna IC drips: Mg sulfate, vancomycin  Labs: Mg 1.6 (replacing)  Diet Order:   Diet Order             Diet regular Room service appropriate? Yes; Fluid consistency: Thin  Diet effective now                   EDUCATION NEEDS:   No education needs have been identified at this time  Skin:  Skin Assessment: Skin Integrity Issues: Skin Integrity Issues:: Other (Comment) Other: non-pressure wound L buttock  Last BM:  5/29 (type 5)  Height:   Ht Readings from Last 1 Encounters:  09/17/21 '5\' 4"'$  (1.626 m)    Weight:   Wt Readings from Last 1 Encounters:  09/27/21 (!) 208.2 kg    Ideal Body Weight:  54.5 kg  BMI:  Body mass index is 78.79  kg/m.  Estimated Nutritional Needs:   Kcal:  1900-2100  Protein:  120-135 grams  Fluid:  > 1.9 L  Clayborne Dana, RDN, LDN Clinical Nutrition

## 2021-09-27 NOTE — Progress Notes (Signed)
[facility]  Daily Progress Note   Subjective  -    Krystal Francis is a 61 year old female with a significant past medical history of hypertension, GERD, pulmonary embolism, anemia and morbid obesity who presented with shortness of breath, tachycardia, tachypnea and leukocytosis and was found to meet severe sepsis criteria.  Her blood cultures were positive for MRSA and she was found to have multifocal pneumonia.  During this admission she also had extensive right upper extremity swelling and was found to have an extensive right upper extremity thrombus.  The patient has continued to have positive blood cultures despite an extensive timeframe of IV antibiotics.  There is concern that the thrombus in her right upper extremity may be present with infection thus preventing clearing of her bacteremia.  Notes that the swelling began approximately 2 to 3 days prior to her admission on 09/17/2021    Objective Vitals:   09/27/21 0534 09/27/21 0858 09/27/21 1717 09/27/21 2034  BP: (!) 120/49 (!) 116/55 127/68 117/65  Pulse: 92 (!) 103 87 99  Resp: '18 20 17 18  '$ Temp: 98.1 F (36.7 C) 98 F (36.7 C) 98.8 F (37.1 C) 98 F (36.7 C)  TempSrc:      SpO2: 97% 94% 96% 97%  Weight:      Height:        Intake/Output Summary (Last 24 hours) at 09/27/2021 2234 Last data filed at 09/27/2021 1349 Gross per 24 hour  Intake 400 ml  Output 1550 ml  Net -1150 ml    PULM  CTAB CV  RRR VASC  right upper extremity with 2+ edema  Laboratory CBC    Component Value Date/Time   WBC 10.2 09/26/2021 0352   HGB 7.6 (L) 09/26/2021 0352   HCT 26.2 (L) 09/26/2021 0352   PLT 250 09/26/2021 0352    BMET    Component Value Date/Time   NA 135 09/26/2021 0352   K 4.0 09/26/2021 0352   CL 104 09/26/2021 0352   CO2 26 09/26/2021 0352   GLUCOSE 100 (H) 09/26/2021 0352   BUN 13 09/26/2021 0352   CREATININE 0.50 09/26/2021 0352   CALCIUM 8.3 (L) 09/26/2021 0352   GFRNONAA >60 09/26/2021  0352    Assessment/Planning Right upper extremity thrombus The patient notes that her symptoms began approximately 2 weeks ago or so.  Based on this it is possible that the thrombus has begun to organize becoming chronic in nature, which will decrease the effectiveness of the thrombectomy.  However, if the patient has seeded this thrombus removing as much as possible will possibly help her to clear her bacteremia.  I have discussed the risk, benefits and alternatives with the patient in detail.  She is agreeable to proceed.  Left lower extremity PAD  The patient does not have symptoms of worsening ischemia.  Given the bacteremia we will delay intervention until she has negative blood cultures as we do not want any possible stents or interventions possibly being seeded.  Kris Hartmann  09/27/2021, 10:34 PM

## 2021-09-27 NOTE — Progress Notes (Signed)
Date of Admission:  09/15/2021   Krystal Francis is a 61 y.o. female Principal Problem:   Multifocal pneumonia Active Problems:   Morbid obesity with body mass index of 70 and over in adult [facility])   HTN (hypertension)   Severe sepsis (HCC)   Hematuria   Iron deficiency anemia   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[place])   Pulmonary embolism (HCC)   Urinary retention   MRSA bacteremia   Hypomagnesemia   Acute hypoxemic respiratory failure (HCC)   Arterial hypotension   Palliative care by specialist   Goals of care, counseling/discussion    Subjective: Patient is feeling better Swollen right arm. Rash in the body has subsided Medications:   enoxaparin (LOVENOX) injection  120 mg Subcutaneous Q12H   feeding supplement  237 mL Oral BID BM   iron polysaccharides  150 mg Oral Daily   magnesium oxide  400 mg Oral Daily   medroxyPROGESTERone  10 mg Oral BID   multivitamin with minerals  1 tablet Oral Daily   mupirocin ointment  1 application. Nasal BID   pantoprazole  40 mg Oral Daily   senna-docusate  2 tablet Oral BID   silver sulfADIAZINE   Topical BID    Objective: Vital signs in last 24 hours: Temp:  [98 F (36.7 C)-98.3 F (36.8 C)] 98 F (36.7 C) (05/30 0858) Pulse Rate:  [92-103] 103 (05/30 0858) Resp:  [16-20] 20 (05/30 0858) BP: (113-124)/(49-70) 116/55 (05/30 0858) SpO2:  [92 %-97 %] 94 % (05/30 0858) Weight:  [208.2 kg] 208.2 kg (05/30 0500)    PHYSICAL EXAM:  General: Alert, cooperative, no distress at rest , . Morbid obesity Lungs: b/l air entry Heart: Regular rate and rhythm, no murmur, rub or gallop. Abdomen: Soft, non-tender,not distended. Bowel sounds normal. No masses Extremities:rt arm severely swollen .all the blisters have deroofed and it is very dry now skin: AGEP much better Lymph: Cervical, supraclavicular normal. Neurologic: Grossly non-focal  Lab Results Recent Labs    09/25/21 0023 09/26/21 0352  WBC 12.2* 10.2  HGB 7.6* 7.6*  HCT 26.2* 26.2*  NA  135 135  K 3.8 4.0  CL 103 104  CO2 26 26  BUN 18 13  CREATININE 0.43* 0.50     Microbiology: 09/17/21 4/4 MRSA 09/19/21- MRSA bacteremia 2/4 blood culture 09/22/21 MRSA 09/27/2021: Blood cultures have been sent   Assessment/Plan:  MRSA bacteremia.  Patient had central line at [facility] for 4 weeks.  Also has extensive thrombosis of the right upper extremity.  Very likely the thrombus is being seeded.  Because of persistent bacteremia from 5/20 until 09/22/2021 she very likely will need thrombectomy.  She is on IV vancomycin.  The levels will have to be adjusted.  Initially it was very high and then it has been low.  The initial plan was to get a TEE as she had bilateral pulmonary infiltrates to rule out infective endocarditis.  But Cardiology concerned about her risk of morbid obesity and being on oxygen. Continue vancomycin Will need for minimum 6 weeks  Repeat blood culture to see clearance  Right upper extremity DVT.  CPS edema anticoagulation  AGEP-allergic reaction to chlorhexidine wipes.  Has resolved  Anemia with history of recent vaginal bleeding.  Has been on Provera Needed multiple blood transfusion in March 2023  Recent PE with right strain Foley catheter removed and patient has pure wick  Lymphedema legs.  Left worse than the right.  Discussed the management with the patient and the hospitalist..

## 2021-09-28 ENCOUNTER — Encounter: Payer: Self-pay | Admitting: Internal Medicine

## 2021-09-28 ENCOUNTER — Inpatient Hospital Stay: Payer: Medicare Other

## 2021-09-28 ENCOUNTER — Inpatient Hospital Stay: Payer: Medicare Other | Admitting: Anesthesiology

## 2021-09-28 ENCOUNTER — Encounter
Admission: EM | Disposition: A | Discharge status: Home Health | Payer: Self-pay | Source: Home / Self Care | Attending: Osteopathic Medicine

## 2021-09-28 DIAGNOSIS — I82890 Acute embolism and thrombosis of other specified veins: Secondary | ICD-10-CM

## 2021-09-28 DIAGNOSIS — I82721 Chronic embolism and thrombosis of deep veins of right upper extremity: Secondary | ICD-10-CM | POA: Diagnosis not present

## 2021-09-28 DIAGNOSIS — I82B11 Acute embolism and thrombosis of right subclavian vein: Secondary | ICD-10-CM

## 2021-09-28 DIAGNOSIS — B9562 Methicillin resistant Staphylococcus aureus infection as the cause of diseases classified elsewhere: Secondary | ICD-10-CM | POA: Diagnosis not present

## 2021-09-28 DIAGNOSIS — R7881 Bacteremia: Secondary | ICD-10-CM | POA: Diagnosis not present

## 2021-09-28 DIAGNOSIS — J189 Pneumonia, unspecified organism: Secondary | ICD-10-CM | POA: Diagnosis not present

## 2021-09-28 DIAGNOSIS — I871 Compression of vein: Secondary | ICD-10-CM

## 2021-09-28 HISTORY — PX: PERIPHERAL VASCULAR THROMBECTOMY: CATH118306

## 2021-09-28 HISTORY — PX: THROMBECTOMY BRACHIAL ARTERY: SHX6649

## 2021-09-28 LAB — CBC
HCT: 26.4 % — ABNORMAL LOW (ref 36.0–46.0)
Hemoglobin: 7.7 g/dL — ABNORMAL LOW (ref 12.0–15.0)
MCH: 21.8 pg — ABNORMAL LOW (ref 26.0–34.0)
MCHC: 29.2 g/dL — ABNORMAL LOW (ref 30.0–36.0)
MCV: 74.8 fL — ABNORMAL LOW (ref 80.0–100.0)
Platelets: 281 10*3/uL (ref 150–400)
RBC: 3.53 MIL/uL — ABNORMAL LOW (ref 3.87–5.11)
RDW: 24.8 % — ABNORMAL HIGH (ref 11.5–15.5)
WBC: 8.7 10*3/uL (ref 4.0–10.5)
nRBC: 0.7 % — ABNORMAL HIGH (ref 0.0–0.2)

## 2021-09-28 LAB — BLOOD CULTURE ID PANEL (REFLEXED) - BCID2

## 2021-09-28 LAB — BASIC METABOLIC PANEL
Anion gap: 7 (ref 5–15)
BUN: 10 mg/dL (ref 6–20)
CO2: 24 mmol/L (ref 22–32)
Calcium: 8.5 mg/dL — ABNORMAL LOW (ref 8.9–10.3)
Chloride: 102 mmol/L (ref 98–111)
Creatinine, Ser: 0.39 mg/dL — ABNORMAL LOW (ref 0.44–1.00)
GFR, Estimated: >60 mL/min (ref >60)
Glucose, Bld: 97 mg/dL (ref 70–99)
Potassium: 3.8 mmol/L (ref 3.5–5.1)
Sodium: 133 mmol/L — ABNORMAL LOW (ref 135–145)

## 2021-09-28 LAB — MAGNESIUM: Magnesium: 1.8 mg/dL (ref 1.7–2.4)

## 2021-09-28 SURGERY — THROMBECTOMY, ARTERY, BRACHIAL
Anesthesia: Monitor Anesthesia Care | Laterality: Right

## 2021-09-28 SURGERY — PERIPHERAL VASCULAR THROMBECTOMY
Anesthesia: Moderate Sedation | Laterality: Right

## 2021-09-28 MED ORDER — VANCOMYCIN HCL 2000 MG/400ML IV SOLN
2000.0000 mg | INTRAVENOUS | Status: DC
Start: 2021-09-28 — End: 2021-09-28

## 2021-09-28 MED ORDER — VANCOMYCIN HCL 10 G IV SOLR
2000.0000 mg | INTRAVENOUS | Status: DC
  Administered 2021-09-28: 2000 mg via INTRAVENOUS
  Filled 2021-09-28: qty 40
  Filled 2021-09-28: qty 20

## 2021-09-28 MED ORDER — HYDROMORPHONE HCL 1 MG/ML IJ SOLN: 1.0000 mg | Freq: Once | INTRAMUSCULAR | Status: DC | PRN

## 2021-09-28 MED ORDER — MIDAZOLAM HCL 2 MG/2ML IJ SOLN
INTRAMUSCULAR | Status: DC | PRN
  Administered 2021-09-28 (×2): 2 mg via INTRAVENOUS

## 2021-09-28 MED ORDER — MIDAZOLAM HCL 2 MG/2ML IJ SOLN
INTRAMUSCULAR | Status: AC
  Filled 2021-09-28: qty 2

## 2021-09-28 MED ORDER — FENTANYL CITRATE (PF) 100 MCG/2ML IJ SOLN
INTRAMUSCULAR | Status: AC
  Filled 2021-09-28: qty 2

## 2021-09-28 MED ORDER — DEXMEDETOMIDINE (PRECEDEX) IN NS 20 MCG/5ML (4 MCG/ML) IV SYRINGE
PREFILLED_SYRINGE | INTRAVENOUS | Status: DC | PRN
  Administered 2021-09-28 (×2): 20 ug via INTRAVENOUS

## 2021-09-28 MED ORDER — DEXMEDETOMIDINE HCL IN NACL 80 MCG/20ML IV SOLN
INTRAVENOUS | Status: AC
  Filled 2021-09-28: qty 40

## 2021-09-28 MED ORDER — FAMOTIDINE 20 MG PO TABS: 40.0000 mg | ORAL_TABLET | Freq: Once | ORAL | Status: DC | PRN

## 2021-09-28 MED ORDER — FENTANYL CITRATE (PF) 100 MCG/2ML IJ SOLN
INTRAMUSCULAR | Status: DC | PRN
  Administered 2021-09-28: 50 ug via INTRAVENOUS
  Administered 2021-09-28 (×3): 25 ug via INTRAVENOUS
  Administered 2021-09-28: 50 ug via INTRAVENOUS
  Administered 2021-09-28: 25 ug via INTRAVENOUS

## 2021-09-28 MED ORDER — DEXMEDETOMIDINE HCL IN NACL 200 MCG/50ML IV SOLN
INTRAVENOUS | Status: DC | PRN
  Administered 2021-09-28: .4 ug/kg/h via INTRAVENOUS

## 2021-09-28 MED ORDER — METHYLPREDNISOLONE SODIUM SUCC 125 MG IJ SOLR: 125.0000 mg | Freq: Once | INTRAMUSCULAR | Status: DC | PRN

## 2021-09-28 MED ORDER — ALTEPLASE 2 MG IJ SOLR
INTRAMUSCULAR | Status: DC | PRN
  Administered 2021-09-28: 16 mg

## 2021-09-28 MED ORDER — MIDAZOLAM HCL 2 MG/ML PO SYRP: 8.0000 mg | ORAL_SOLUTION | Freq: Once | ORAL | Status: DC | PRN

## 2021-09-28 MED ORDER — SODIUM CHLORIDE 0.9 % IV SOLN
INTRAVENOUS | Status: DC
Start: 2021-09-28 — End: 2021-09-28

## 2021-09-28 MED ORDER — DIPHENHYDRAMINE HCL 50 MG/ML IJ SOLN: 50.0000 mg | Freq: Once | INTRAMUSCULAR | Status: DC | PRN

## 2021-09-28 MED ORDER — ONDANSETRON HCL 4 MG/2ML IJ SOLN: 4.0000 mg | Freq: Four times a day (QID) | INTRAMUSCULAR | Status: DC | PRN

## 2021-09-28 MED ORDER — HEPARIN SODIUM (PORCINE) 1000 UNIT/ML IJ SOLN
INTRAMUSCULAR | Status: DC | PRN
  Administered 2021-09-28: 4000 [IU] via INTRAVENOUS

## 2021-09-28 MED ORDER — IODIXANOL 320 MG/ML IV SOLN
INTRAVENOUS | Status: DC | PRN
  Administered 2021-09-28: 40 mL via INTRAVENOUS

## 2021-09-28 MED ORDER — ALTEPLASE 2 MG IJ SOLR
INTRAMUSCULAR | Status: AC
  Filled 2021-09-28: qty 16

## 2021-09-28 SURGICAL SUPPLY — 49 items
BAG DECANTER FOR FLEXI CONT (MISCELLANEOUS) ×2 IMPLANT
BLADE SURG SZ11 CARB STEEL (BLADE) ×2 IMPLANT
BOOT SUTURE AID YELLOW STND (SUTURE) ×2 IMPLANT
BRUSH SCRUB EZ  4% CHG (MISCELLANEOUS) ×2
BRUSH SCRUB EZ 4% CHG (MISCELLANEOUS) ×1 IMPLANT
DERMABOND ADVANCED (GAUZE/BANDAGES/DRESSINGS) ×1
DERMABOND ADVANCED .7 DNX12 (GAUZE/BANDAGES/DRESSINGS) ×1 IMPLANT
DRESSING SURGICEL FIBRLLR 1X2 (HEMOSTASIS) ×1 IMPLANT
DRSG SURGICEL FIBRILLAR 1X2 (HEMOSTASIS) ×2
ELECT CAUTERY BLADE 6.4 (BLADE) ×2 IMPLANT
ELECT REM PT RETURN 9FT ADLT (ELECTROSURGICAL) ×2
ELECTRODE REM PT RTRN 9FT ADLT (ELECTROSURGICAL) ×1 IMPLANT
GLOVE SURG SYN 8.0 (GLOVE) ×2 IMPLANT
GLOVE SURG SYN 8.0 PF PI (GLOVE) ×1 IMPLANT
GOWN STRL REUS W/ TWL LRG LVL3 (GOWN DISPOSABLE) ×1 IMPLANT
GOWN STRL REUS W/ TWL XL LVL3 (GOWN DISPOSABLE) ×1 IMPLANT
GOWN STRL REUS W/TWL LRG LVL3 (GOWN DISPOSABLE) ×2
GOWN STRL REUS W/TWL XL LVL3 (GOWN DISPOSABLE) ×2
IV NS 500ML (IV SOLUTION) ×2
IV NS 500ML BAXH (IV SOLUTION) ×1 IMPLANT
KIT TURNOVER KIT A (KITS) ×2 IMPLANT
LABEL OR SOLS (LABEL) ×2 IMPLANT
LOOP RED MAXI  1X406MM (MISCELLANEOUS) ×2
LOOP VESSEL MAXI  1X406 RED (MISCELLANEOUS) ×2
LOOP VESSEL MAXI 1X406 RED (MISCELLANEOUS) ×2 IMPLANT
LOOP VESSEL MINI 0.8X406 BLUE (MISCELLANEOUS) ×1 IMPLANT
LOOPS BLUE MINI 0.8X406MM (MISCELLANEOUS) ×1
MANIFOLD NEPTUNE II (INSTRUMENTS) ×2 IMPLANT
NDL HYPO 25X1 1.5 SAFETY (NEEDLE) ×1 IMPLANT
NEEDLE HYPO 25X1 1.5 SAFETY (NEEDLE) ×2 IMPLANT
NS IRRIG 500ML POUR BTL (IV SOLUTION) ×2 IMPLANT
PACK EXTREMITY ARMC (MISCELLANEOUS) ×2 IMPLANT
PATCH CAROTID ECM VASC 1X10 (Prosthesis & Implant Heart) IMPLANT
STOCKINETTE 48X4 2 PLY STRL (GAUZE/BANDAGES/DRESSINGS) ×1 IMPLANT
STOCKINETTE STRL 4IN 9604848 (GAUZE/BANDAGES/DRESSINGS) ×2 IMPLANT
SUT MNCRL 4-0 (SUTURE) ×2
SUT MNCRL 4-0 27XMFL (SUTURE) ×1
SUT PROLENE 6 0 BV (SUTURE) ×4 IMPLANT
SUT SILK 2 0 (SUTURE) ×2
SUT SILK 2-0 18XBRD TIE 12 (SUTURE) ×1 IMPLANT
SUT SILK 3 0 (SUTURE) ×2
SUT SILK 3-0 18XBRD TIE 12 (SUTURE) ×1 IMPLANT
SUT SILK 4 0 (SUTURE) ×2
SUT SILK 4-0 18XBRD TIE 12 (SUTURE) ×1 IMPLANT
SUT VIC AB 3-0 SH 27 (SUTURE) ×2
SUT VIC AB 3-0 SH 27X BRD (SUTURE) ×1 IMPLANT
SUTURE MNCRL 4-0 27XMF (SUTURE) ×1 IMPLANT
SYR 20ML LL LF (SYRINGE) ×2 IMPLANT
WATER STERILE IRR 500ML POUR (IV SOLUTION) ×2 IMPLANT

## 2021-09-28 SURGICAL SUPPLY — 19 items
BALLN ATG 12X6X80 (BALLOONS) ×2
BALLN DORADO 10X80X80 (BALLOONS) ×2
BALLOON ATG 12X6X80 (BALLOONS) IMPLANT
BALLOON DORADO 10X80X80 (BALLOONS) IMPLANT
CANISTER PENUMBRA ENGINE (MISCELLANEOUS) ×1 IMPLANT
CATH BEACON 5 .035 65 KMP TIP (CATHETERS) ×1 IMPLANT
CATH INDIGO 7D KIT (CATHETERS) ×1 IMPLANT
CATH INDIGO SEP 7D (CATHETERS) ×1 IMPLANT
CATH INFUS 90CMX20CM (CATHETERS) ×1 IMPLANT
COVER PROBE U/S 5X48 (MISCELLANEOUS) ×1 IMPLANT
DRAPE BRACHIAL (DRAPES) ×1 IMPLANT
GLIDEWIRE ADV .035X180CM (WIRE) ×1 IMPLANT
GLIDEWIRE STIFF .35X180X3 HYDR (WIRE) ×1 IMPLANT
KIT ENCORE 26 ADVANTAGE (KITS) ×1 IMPLANT
NDL ENTRY 21GA 7CM ECHOTIP (NEEDLE) IMPLANT
NEEDLE ENTRY 21GA 7CM ECHOTIP (NEEDLE) ×2 IMPLANT
PACK ANGIOGRAPHY (CUSTOM PROCEDURE TRAY) ×2 IMPLANT
SET INTRO CAPELLA COAXIAL (SET/KITS/TRAYS/PACK) ×1 IMPLANT
SHEATH BRITE TIP 7FRX11 (SHEATH) ×1 IMPLANT

## 2021-09-28 NOTE — Progress Notes (Signed)
Unable to change dressing on LUE, due to new surgical dressing from thrombectomy over old wound dressing.

## 2021-09-28 NOTE — Op Note (Signed)
VEIN AND VASCULAR SURGERY   OPERATIVE NOTE   PRE-OPERATIVE DIAGNOSIS: extensive right arm DVT  POST-OPERATIVE DIAGNOSIS: same   PROCEDURE: 1.   US guidance for vascular access to right brachial vein 2.   Catheter placement into the superior vena cava from right brachial venous approach 3.   SVC gram and right arm venogram 4.   Catheter directed thrombolysis with 16 mg of tPA to the right brachial vein, axillary vein, subclavian vein and innominate vein 5.   Mechanical thrombectomy right brachial vein, axillary vein, subclavian vein and innominate vein 6.   PTA of right innominate vein with 10 mm and then a 12 mm balloon    SURGEON: Hortencia Pilar, MD  ASSISTANT(S): none  ANESTHESIA: General anesthesia  ESTIMATED BLOOD LOSS: 25 cc  FINDING(S): 1.  Thrombus within the brachial veins of the right arm as well as the right axillary subclavian and [place] vein.  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  SPECIMEN(S):  none  INDICATIONS:    Patient is a 61 y.o. female who presents with massive's painful swelling of the right arm he will.  Patient has marked arm he will need swelling and pain.  Venous intervention is performed to reduce the symtpoms and avoid Diekmann term postphlebitic symptoms.  Also she has had persistent positive blood cultures and there is concern regarding septic thrombophlebitis.  Therefore thrombus retrieved from the arm was sent in a sterile specimen cup for culture.  DESCRIPTION: After obtaining full informed written consent, the patient was brought back to the vascular suite and placed supine upon the table. Moderate conscious sedation was administered during a face to face encounter with the patient throughout the procedure with my supervision of the RN administering medicines and monitoring the patient's vital  signs, pulse oximetry, telemetry and mental status throughout from the start of the procedure until the patient was taken to the recovery room.  After obtaining adequate anesthesia, the patient was prepped and draped in the standard fashion for right arm venography and intervention.     The patient was then placed into the prone position.  The right brachial vein was then accessed under direct ultrasound guidance without difficulty with a micropuncture needle and a permanent image was recorded.  I then upsized to an 7Fr sheath over a J wire.  4000 units of heparin were then given.  Imaging by hand-injection showed extensive DVT with minimal forward flow flow.  A Kumpe catheter and stiff angled glide wire were then advanced into the superior vena cava and images were performed.      16 mg of tPA was then reconstituted and delivered in a sterile fashion onto the field.  A infusion catheter with 20 cm infusion length was then advanced over the Glidewire and the Glidewire removed and the obturator wire placed.  8 cc of tPA was then infused into the innominate and subclavian.  The catheter was then repositioned and the second 8 cc was infused into the subclavian axillary and brachial veins.   After this dwelled for 15 minutes, I used the Penumbra Cat 7D catheter and evacuated about 300 cc of effluent with mechanical thrombectomy throughout the right brachial vein axillary vein subclavian vein and the innominate vein.  Multiple passes were made.  Follow-up imaging demonstrated patency of the vein now with marked reduction in the thrombus  burden.  Initially, I used a 10 mm diameter angioplasty balloon with 2 separate inflations to 16 atm for 1 minute.  This resulted in some resolution of the thrombus and improved flow.  The stenosis/occlusion and thrombus was treated with a 12 mm x 60 mm diameter angioplasty balloon.  Inflation across the innominate vein stenosis was for 1 minute at 16 atm.  Follow-up imaging  demonstrated less than 20% residual stenosis.  I then elected to terminate the procedure.  The sheath was removed and a dressing was placed.  She was taken to the recovery room in stable condition having tolerated the procedure well.    INTERPRETATION: Initial imaging demonstrates occlusive thrombus throughout the entire brachial axillary subclavian and innominate vein.  Superior vena cava is widely patent.  There is reflux of some contrast into the left innominate vein which appears patent in its visualized portions.  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  This is angioplastied to a maximal diameter of 12 mm and showed less than 20% residual stenosis.  Successful venous thrombectomy of the right arm with angioplasty of central venous stenosis.   COMPLICATIONS: None  CONDITION: Stable  Hortencia Pilar 09/28/2021 2:31 PM

## 2021-09-28 NOTE — Transfer of Care (Signed)
Immediate Anesthesia Transfer of Care Note  Patient: Krystal Francis  Procedure(s) Performed: THROMBECTOMY RIGHT UPPER EXTREMITY (Right)  Patient Location: PACU  Anesthesia Type:MAC  Level of Consciousness: awake, alert  and oriented  Airway & Oxygen Therapy: Patient Spontanous Breathing and Patient connected to nasal cannula oxygen  Post-op Assessment: Report given to RN and Post -op Vital signs reviewed and stable  Post vital signs: Reviewed and stable  Last Vitals:  Vitals Value Taken Time  BP 111/64   Temp    Pulse 78   Resp 12   SpO2 97     Last Pain:  Vitals:   09/28/21 0754  TempSrc: Oral  PainSc: 0-No pain         Complications: No notable events documented.

## 2021-09-28 NOTE — Progress Notes (Signed)
PT Cancellation Note  Patient Details Name: Krystal Francis MRN: [id number] DOB: [birth year]   Cancelled Treatment:    Reason Eval/Treat Not Completed: Patient at procedure or test/unavailable, will attempt to see pt at a future date/time as medically appropriate.     Linus Salmons PT, DPT 09/28/21, 8:41 AM

## 2021-09-28 NOTE — Anesthesia Postprocedure Evaluation (Signed)
Anesthesia Post Note  Patient: Krystal Francis  Procedure(s) Performed: THROMBECTOMY RIGHT UPPER EXTREMITY (Right)  Patient location during evaluation: PACU Anesthesia Type: MAC Level of consciousness: awake and alert Pain management: pain level controlled Vital Signs Assessment: post-procedure vital signs reviewed and stable Respiratory status: spontaneous breathing, nonlabored ventilation, respiratory function stable and patient connected to nasal cannula oxygen Cardiovascular status: blood pressure returned to baseline and stable Postop Assessment: no apparent nausea or vomiting Anesthetic complications: no   No notable events documented.   Last Vitals:  Vitals:   09/28/21 1310 09/28/21 1315  BP: 125/71 125/71  Pulse: 69 70  Resp: 18 16  Temp: 36.7 C   SpO2: 97% 100%    Last Pain:  Vitals:   09/28/21 1315  TempSrc:   PainSc: [place]

## 2021-09-28 NOTE — Progress Notes (Addendum)
Progress Note   Patient: Krystal Francis FSE:[id number] DOB: [birth year] DOA: 09/15/2021     11 DOS: the patient was seen and examined on 09/28/2021   Brief hospital course: Krystal Francis is a 61 y.o. female with medical history significant of RV failure, hypertension, GERD, PE on Lovenox, iron deficiency anemia, urinary retention with Foley catheter placement, uterine bleeding, morbid obesity with BMI 78.27, who presents with shortness of breath. Patient had a significant tachycardia, tachypnea, leukocytosis and hypoxemia with oxygen saturation 87%, she met severe sepsis criteria. Her blood culture positive for MRSA, CT angiogram showed multifocal pneumonia with evidence of pulmonary hypertension. She was seen by ID, vancomycin was started.  Assessment and Plan:  Severe sepsis secondary to multifocal pneumonia. Multifocal pneumonia secondary to MRSA. MRSA septicemia. Acute hypoxemic respiratory failure secondary to pneumonia. Ruled in, POA Repeated blood culture drawn on 5/22 and 5/25 still positive for staph.   --ID consulted --TTE no mention of thrombus Plan: --cont IV vanc --due to pt's habitus and oxygen requirement, cardio will re-eval for safety of performing TEE when pt is closer to discharge --Vascular consulted for thrombectomy of RUE DVT (for concern of MRSA seeding) --Cardiology consulted for TEE   Hypotension  --partially due to severe right-sided congestive heart failure.  She was giving albumin, midodrine.  She was briefly treated in ICU, was started on IV solumedrol.  Blood pressure better, transfer patient back to PCU on 5/23.  Solumedrol d/c'ed.   Recent pulmonary emboli --on tx dose Lovenox PTA, and switched to heparin gtt while inpatient due to concern of hematuria, which has resolved. --cont home lovenox   Right arm swelling 2/2 RUE DVT --pt with severe edema of RUE, and found to have extensive occlusive thrombus  --cont home Lovenox --Vascular consulted  --Krystal Francis for  thrombectomy of RUE DVT (for concern of MRSA seeding) today (5/31)  Acute blood loss anemia with iron deficient anemia. Gross hematuria secondary to indwelling Foley catheter. Patient hemoglobin has been low, but no additional hematuria. Due to severe right-sided congestive heart failure, pt received 1 unit of PRBC. Plan: --cont iron supplement --Monitor Hgb   Hypomagnesemia.  Resolved w replacement Hypokalemia.  Resolved w replacement --monitor and replete PRN   Hyponatremia - improved. Monitor BMP.  Morbid obesity with BMI 78.27. Complicates overall care and prognosis.  Recommend lifestyle modifications including physical activity and diet for weight loss and overall Krystal Francis health.    Chronic right-sided congestive heart failure. Assess daily for diuretic needs.   Left leg severe peripheral arterial disease. Patient has leg pain at rest, duplex ultrasound showed suspected significant peripheral vascular disease.  Patient was seen by vascular surgery, no intervention planned due to bacteremia.   --tramadol PRN --No need for IV opioids   Contact dermatitis  Was wiped down with CHG wipes per Infectious disease protocol and developed blistering to right arm and elbow on both upper arm and forearm.  Improved. --wound care per order   Foley cath, POA, removed --Pt presented with Foley cath.  Per pt, Foley cath was inserted for keeping her skin dry, not for retention.  Removed on 5/25.         Subjective: Seen awake resting bed this afternoon.  She went for thrombectomy this morning and denies any pain or discomfort in her arm at this time.  She reports being very hungry, ready to eat since she was n.p.o. all day.  She otherwise reports fatigue but no other acute complaints.  Physical Exam: Vitals:   09/28/21  1345 09/28/21 1400 09/28/21 1415 09/28/21 1430  BP: (!) 147/72 (!) 110/52 131/69 113/67  Pulse: 73 80 81 75  Resp: 16 16 (!) 23 (!) 23  Temp:   98 F (36.7 C)    TempSrc:      SpO2: 100% 99% 99% 100%  Weight:      Height:         Data Reviewed:  Notable labs: Sodium 133, creatinine 0.39, calcium 8.5, hemoglobin stable 7.7  Blood cultures collected 5/30 with MRSE  Family Communication: None  Disposition: Status is: Inpatient Remains inpatient appropriate because: Ongoing evaluation not appropriate for the outpatient setting   Planned Discharge Destination: Skilled nursing facility    Time spent: 35 minutes  Author: Ezekiel Slocumb, DO 09/28/2021 5:14 PM  For on call review [email or web address].

## 2021-09-28 NOTE — OR Nursing (Signed)
Pt Krystal Francis's OR chart was charted by IR Tech. OR circ added specimen. Please refer to IR chart for procedure info.

## 2021-09-28 NOTE — Progress Notes (Signed)
   Date of Admission:  09/15/2021   Krystal Francis is a 61 y.o. female Principal Problem:   Multifocal pneumonia Active Problems:   Morbid obesity with body mass index of 70 and over in adult [facility])   HTN (hypertension)   Severe sepsis (HCC)   Hematuria   Iron deficiency anemia   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[place])   Pulmonary embolism (HCC)   Urinary retention   MRSA bacteremia   Hypomagnesemia   Acute hypoxemic respiratory failure (HCC)   Arterial hypotension   Palliative care by specialist   Goals of care, counseling/discussion    Subjective: Pt underwent rt upper extremity thrombectomy for extensive thrombosis Culture has been sent Feeling better Has dry cough No fever Medications:   enoxaparin (LOVENOX) injection  120 mg Subcutaneous Q12H   feeding supplement  237 mL Oral BID BM   iron polysaccharides  150 mg Oral Daily   magnesium oxide  400 mg Oral Daily   medroxyPROGESTERone  10 mg Oral BID   multivitamin with minerals  1 tablet Oral Daily   pantoprazole  40 mg Oral Daily   senna-docusate  2 tablet Oral BID   silver sulfADIAZINE   Topical BID    Objective: Vital signs in last 24 hours: Temp:  [98 F (36.7 C)-98.2 F (36.8 C)] 98 F (36.7 C) (05/31 1415) Pulse Rate:  [69-104] 75 (05/31 1430) Resp:  [16-23] 23 (05/31 1430) BP: (110-147)/(52-72) 113/67 (05/31 1430) SpO2:  [93 %-100 %] 100 % (05/31 1430) Weight:  [209 kg] 209 kg (05/31 0551)    PHYSICAL EXAM:  General: Alert, cooperative, no distress at rest , . Morbid obesity Lungs: b/l air entry Few basal crepts Heart: Regular rate and rhythm, no murmur, rub or gallop. Abdomen: Soft, non-tender,not distended. Bowel sounds normal. No masses Extremities:rt arm severely swollen .rash has dried up B/l lymphedema legs Lymph: Cervical, supraclavicular normal. Neurologic: Grossly non-focal  Lab Results Recent Labs    09/26/21 0352 09/28/21 0516  WBC 10.2 8.7  HGB 7.6* 7.7*  HCT 26.2* 26.4*  NA 135 133*  K 4.0  3.8  CL 104 102  CO2 26 24  BUN 13 10  CREATININE 0.50 0.39*     Microbiology: 09/17/21 4/4 MRSA 09/19/21- MRSA bacteremia 2/4 blood culture 09/22/21 MRSA 09/27/2021: Blood cultures have been sent   Assessment/Plan:  MRSA bacteremia.  Patient had central line at [facility] for 4 weeks.  Also has extensive thrombosis of the right upper extremity.  Very likely the thrombus is being seeded.  Because of persistent bacteremia from 5/20 until 09/22/2021   The initial plan was to get a TEE as she had bilateral pulmonary infiltrates to rule out infective endocarditis.  But Cardiology concerned about her risk of morbid obesity and being on oxygen. Continue vancomycin Will need for minimum 6 weeks Will repeat CXR to look for improvement in lung infiltrates  Repeat blood culture to see clearance  Right upper extremity DVT.  Underwent thrombectomy today because eof concern for seeding of thrombus due to persistent bacteremia  AGEP-allergic reaction to chlorhexidine wipes.  Has resolved  Anemia with history of recent vaginal bleeding.  Has been on Provera Needed multiple blood transfusion in March 2023  Recent PE with right strain Foley catheter removed and patient has pure wick  Lymphedema legs.  Left worse than the right.  Discussed the management with the patient and the hospitalist..

## 2021-09-28 NOTE — Interval H&P Note (Signed)
History and Physical Interval Note:  09/28/2021 11:25 AM  Krystal Francis  has presented today for surgery, with the diagnosis of Right arm swelling.  The various methods of treatment have been discussed with the patient and family. After consideration of risks, benefits and other options for treatment, the patient has consented to  Procedure(s): PERIPHERAL VASCULAR THROMBECTOMY (Right) as a surgical intervention.  The patient's history has been reviewed, patient examined, no change in status, stable for surgery.  I have reviewed the patient's chart and labs.  Questions were answered to the patient's satisfaction.     Hortencia Pilar

## 2021-09-28 NOTE — Progress Notes (Signed)
PT Cancellation Note  Patient Details Name: Krystal Francis MRN: [id number] DOB: 01-06-1961   Cancelled Treatment:    Reason Eval/Treat Not Completed: Patient remains out of room at procedure or test and unavailable to participate with PT services.  Will attempt to see pt at a future date/time as medically appropriate.     Linus Salmons PT, DPT 09/28/21, 1:57 PM

## 2021-09-28 NOTE — Anesthesia Preprocedure Evaluation (Signed)
Anesthesia Evaluation  Patient identified by MRN, date of birth, ID band Patient awake    Reviewed: Allergy & Precautions, NPO status , Patient's Chart, lab work & pertinent test results  History of Anesthesia Complications Negative for: history of anesthetic complications  Airway Mallampati: III  TM Distance: >3 FB Neck ROM: full    Dental  (+) Chipped, Poor Dentition, Missing   Pulmonary pneumonia, COPD,  oxygen dependent,    Pulmonary exam normal        Cardiovascular Exercise Tolerance: Good hypertension, (-) angina(-) Past MI and (-) DOE Normal cardiovascular exam     Neuro/Psych negative neurological ROS  negative psych ROS   GI/Hepatic negative GI ROS, Neg liver ROS, neg GERD  ,  Endo/Other  negative endocrine ROS  Renal/GU Renal disease     Musculoskeletal   Abdominal   Peds  Hematology negative hematology ROS (+)   Anesthesia Other Findings Past Medical History: No date: Anemia, unspecified No date: Hypertension  Past Surgical History: No date: DILATION AND CURETTAGE OF UTERUS  BMI    Body Mass Index: 79.09 kg/m      Reproductive/Obstetrics negative OB ROS                             Anesthesia Physical Anesthesia Plan  ASA: 4  Anesthesia Plan: MAC   Post-op Pain Management:    Induction: Intravenous  PONV Risk Score and Plan:   Airway Management Planned: Natural Airway and Nasal Cannula  Additional Equipment:   Intra-op Plan:   Post-operative Plan:   Informed Consent: I have reviewed the patients History and Physical, chart, labs and discussed the procedure including the risks, benefits and alternatives for the proposed anesthesia with the patient or authorized representative who has indicated his/her understanding and acceptance.     Dental Advisory Given  Plan Discussed with: Anesthesiologist, CRNA and Surgeon  Anesthesia Plan Comments: (Patient  consented for risks of anesthesia including but not limited to:  - adverse reactions to medications - damage to eyes, teeth, lips or other oral mucosa - nerve damage due to positioning  - sore throat or hoarseness - Damage to heart, brain, nerves, lungs, other parts of body or loss of life  Patient voiced understanding.)        Anesthesia Quick Evaluation

## 2021-09-28 NOTE — Progress Notes (Signed)
PHARMACY - PHYSICIAN COMMUNICATION CRITICAL VALUE ALERT - BLOOD CULTURE IDENTIFICATION (BCID)  Krystal Francis is an 61 y.o. female who presented to [facility] on 09/15/2021 with a chief complaint of MRSA bacteremia  Assessment:  1/4 bottles GPC. BCID detected MRSE. Suspected contaminant  Name of physician (or Provider) Contacted: Dr. Delaine Lame & Dr. Arbutus Ped  Current antibiotics: Vancomycin  Changes to prescribed antibiotics recommended:  Further antibiotic decisions per ID  Results for orders placed or performed during the hospital encounter of 09/15/21  Blood Culture ID Panel (Reflexed) (Collected: 09/19/2021  5:06 AM)  Result Value Ref Range   Enterococcus faecalis NOT DETECTED NOT DETECTED   Enterococcus Faecium NOT DETECTED NOT DETECTED   Listeria monocytogenes NOT DETECTED NOT DETECTED   Staphylococcus species DETECTED (A) NOT DETECTED   Staphylococcus aureus (BCID) DETECTED (A) NOT DETECTED   Staphylococcus epidermidis NOT DETECTED NOT DETECTED   Staphylococcus lugdunensis NOT DETECTED NOT DETECTED   Streptococcus species NOT DETECTED NOT DETECTED   Streptococcus agalactiae NOT DETECTED NOT DETECTED   Streptococcus pneumoniae NOT DETECTED NOT DETECTED   Streptococcus pyogenes NOT DETECTED NOT DETECTED   A.calcoaceticus-baumannii NOT DETECTED NOT DETECTED   Bacteroides fragilis NOT DETECTED NOT DETECTED   Enterobacterales NOT DETECTED NOT DETECTED   Enterobacter cloacae complex NOT DETECTED NOT DETECTED   Escherichia coli NOT DETECTED NOT DETECTED   Klebsiella aerogenes NOT DETECTED NOT DETECTED   Klebsiella oxytoca NOT DETECTED NOT DETECTED   Klebsiella pneumoniae NOT DETECTED NOT DETECTED   Proteus species NOT DETECTED NOT DETECTED   Salmonella species NOT DETECTED NOT DETECTED   Serratia marcescens NOT DETECTED NOT DETECTED   Haemophilus influenzae NOT DETECTED NOT DETECTED   Neisseria meningitidis NOT DETECTED NOT DETECTED   Pseudomonas aeruginosa NOT DETECTED NOT  DETECTED   Stenotrophomonas maltophilia NOT DETECTED NOT DETECTED   Candida albicans NOT DETECTED NOT DETECTED   Candida auris NOT DETECTED NOT DETECTED   Candida glabrata NOT DETECTED NOT DETECTED   Candida krusei NOT DETECTED NOT DETECTED   Candida parapsilosis NOT DETECTED NOT DETECTED   Candida tropicalis NOT DETECTED NOT DETECTED   Cryptococcus neoformans/gattii NOT DETECTED NOT DETECTED   Meth resistant mecA/C and MREJ DETECTED (A) NOT DETECTED    Benita Gutter 09/28/2021  3:42 PM

## 2021-09-29 ENCOUNTER — Inpatient Hospital Stay: Payer: Medicare Other

## 2021-09-29 ENCOUNTER — Encounter: Payer: Self-pay | Admitting: Vascular Surgery

## 2021-09-29 DIAGNOSIS — R7881 Bacteremia: Secondary | ICD-10-CM | POA: Diagnosis not present

## 2021-09-29 DIAGNOSIS — J189 Pneumonia, unspecified organism: Secondary | ICD-10-CM | POA: Diagnosis not present

## 2021-09-29 DIAGNOSIS — B9562 Methicillin resistant Staphylococcus aureus infection as the cause of diseases classified elsewhere: Secondary | ICD-10-CM | POA: Diagnosis not present

## 2021-09-29 MED ORDER — VANCOMYCIN HCL 2000 MG/400ML IV SOLN
2000.0000 mg | INTRAVENOUS | Status: DC
  Administered 2021-09-29 – 2021-10-13 (×15): 2000 mg via INTRAVENOUS
  Filled 2021-09-29 (×16): qty 400

## 2021-09-29 NOTE — Care Management Important Message (Signed)
Important Message  Patient Details  Name: Krystal Francis MRN: [id number] Date of Birth: March 01, 1961   Medicare Important Message Given:  Yes  Patient is in an isolation room so I called and reviewed the Important Message from Medicare with her over the phone [phone]). She stated she understood her rights and a copy was not needed. I wished her a speedy recovery and thanked her for her time.   [name] 09/29/2021, 2:34 PM

## 2021-09-29 NOTE — Progress Notes (Signed)
PT Cancellation Note  Patient Details Name: Krystal Francis MRN: [id number] DOB: 07-08-1960   Cancelled Treatment:     PT attempt 2 x.Pt continues to refuse. Pt gets agitated when author attempts to encourage participation. " Get out. I don't want to. I was having a good day until you came in.Get out now!" She proceeded to turn the volume up on the TV. Author exited the room as requested by pt.    Willette Pa 09/29/2021, 11:21 AM

## 2021-09-29 NOTE — Progress Notes (Signed)
Progress Note   Patient: Krystal Francis PNT:[id number] DOB: Apr 13, 1961 DOA: 09/15/2021     12 DOS: the patient was seen and examined on 09/29/2021   Brief hospital course: Krystal Francis is a 61 y.o. female with medical history significant of RV failure, hypertension, GERD, PE on Lovenox, iron deficiency anemia, urinary retention with Foley catheter placement, uterine bleeding, morbid obesity with BMI 78.27, who presents with shortness of breath. Patient had a significant tachycardia, tachypnea, leukocytosis and hypoxemia with oxygen saturation 87%, she met severe sepsis criteria. Her blood culture positive for MRSA, CT angiogram showed multifocal pneumonia with evidence of pulmonary hypertension. She was seen by ID, vancomycin was started.  Assessment and Plan:  Severe sepsis secondary to multifocal pneumonia. Multifocal pneumonia secondary to MRSA. MRSA septicemia. Acute hypoxemic respiratory failure secondary to pneumonia. Ruled in, POA Repeated blood culture drawn on 5/22 and 5/25 still positive for staph.   --ID consulted --TTE no mention of thrombus Plan: --cont IV vanc --due to pt's habitus and oxygen requirement, cardio will re-eval for safety of performing TEE when pt is closer to discharge --Cardiology consulted for TEE   Hypotension  --partially due to severe right-sided congestive heart failure.  She was giving albumin, midodrine.  She was briefly treated in ICU, was started on IV solumedrol.  Blood pressure better, transfer patient back to PCU on 5/23.  Solumedrol d/c'ed.   Recent pulmonary emboli --on tx dose Lovenox PTA, and switched to heparin gtt while inpatient due to concern of hematuria, which has resolved. --cont home lovenox   Right arm swelling 2/2 RUE DVT --pt with severe edema of RUE, and found to have extensive occlusive thrombus  --cont home Lovenox --Vascular consulted  --Went for thrombectomy of RUE DVT due to concern of MRSA seeding from the thrombus on  5/31  Acute blood loss anemia with iron deficient anemia. Gross hematuria secondary to indwelling Foley catheter. Patient hemoglobin has been low, but no additional hematuria. Due to severe right-sided congestive heart failure, pt received 1 unit of PRBC. Plan: --cont iron supplement --Monitor Hgb   Hypomagnesemia.  Resolved w replacement Hypokalemia.  Resolved w replacement --monitor and replete PRN   Hyponatremia - improved. Monitor BMP.  Morbid obesity with BMI 78.27. Complicates overall care and prognosis.  Recommend lifestyle modifications including physical activity and diet for weight loss and overall [place]-term health.    Chronic right-sided congestive heart failure. Assess daily for diuretic needs.   Left leg severe peripheral arterial disease. Patient has leg pain at rest, duplex ultrasound showed suspected significant peripheral vascular disease.  Patient was seen by vascular surgery, no intervention planned due to bacteremia.   --tramadol PRN --No need for IV opioids   Contact dermatitis  Was wiped down with CHG wipes per Infectious disease protocol and developed blistering to right arm and elbow on both upper arm and forearm.  Improved. --wound care per order   Foley cath, POA, removed --Pt presented with Foley cath.  Per pt, Foley cath was inserted for keeping her skin dry, not for retention.  Removed on 5/25.         Subjective: Seen awake resting bed today when seen.  She expressed frustration with not understanding the plan.  She woke up to NPO diet still on her white board, but diet was ordered yesterday after procedure.  I explained in detail her clinical course, plans and when she might medically be ready for discharge.  PT/OT have signed off due to patient declining to work with  them.  Pt reports overall feeling okay and offers no specific acute complaints.   Physical Exam: Vitals:   09/28/21 1430 09/28/21 2113 09/29/21 0559 09/29/21 0832  BP: 113/67  112/72 113/67 (!) 98/59  Pulse: 75 100 96 93  Resp: (!) _0 Temp:  98.4 F (36.9 C) 98.5 F (36.9 C) 98.7 F (37.1 C)  TempSrc:  Oral    SpO2: 100% 94% 94% 96%  Weight:      Height:         Data Reviewed:  Notable labs: Na 133, Cr 0.39, Ca 8.5, Hbg 7.7 from 7.6     Family Communication: None  Disposition: Status is: Inpatient Remains inpatient appropriate because: Ongoing evaluation not appropriate for the outpatient setting, on IV antibiotics.  Will require SNF placement.   Planned Discharge Destination: Skilled nursing facility    Time spent: 35 minutes  Author: Ezekiel Slocumb, DO 09/29/2021 4:11 PM  For on call review [email or web address].

## 2021-09-29 NOTE — Progress Notes (Signed)
PT Cancellation Note  Patient Details Name: Krystal Francis MRN: [id number] DOB: 12/07/1960   Cancelled Treatment:    Reason Eval/Treat Not Completed: Other (comment) Pt continues to be resistive to therapy efforts & education. Due to multiple refusals PT to sign off at this time.  Lavone Nian, PT, DPT 09/29/21, 11:31 AM  [name] 09/29/2021, 11:30 AM

## 2021-09-29 NOTE — Consult Note (Signed)
Pharmacy Antibiotic Note  Krystal Francis is a 61 y.o. female w/ h/o RV failure, HTN, GERD, PE PTA on Lovenox, iron deficiency anemia, urinary retention with Foley catheter placement, uterine bleeding, morbid obesity with BMI 78.27, who presents with shortness of breath > w/u c/w PE & multifocal PNA. Pt admitted on 09/15/2021 with pneumonia and bacteremia & BCID resulted with MRSA 4 of 4 vials on 5/20.  Pharmacy has been consulted for escalation of abx to Vancomycin dosing.   -planning 6 weeks IV Vancomycin per ID  Day # 13 vancomycin MRSA bacteremia Renal: SCr stable Repeat blood cxs 5/25: 1 still with Staph aureus Repeat blood cx 5/30 with 1/4 GPC  but BICD detected MRSE and not MRSA  Vancomycin levels: 5/27 Vanc '1750mg'$  hung'@2222'$  -infuse over 2 hrs 5/28  Vanc P'@0023'$ =38  mcg/ml          Vanc T'@2044'$ =10 mcg/ml  6/2 vancomycin 2gm IV q24h  6/2 vanco peak =  at   6/2 random vancomycin level =  at    Plan:    Continue vancomycin  to 2000 mg IV every 24 hours.       AUC:560       Cmin; 10.3 Check vancomycin levels with tonight's dose - peak and random level Follow renal function  Follow 5/30 blood cx Await ability to place PICC once Blood cx remain negative Await OPAT and ability for patient to give antibiotics at home (? Anyone to assist)   Height: '5\' 4"'$  (162.6 cm) Weight: (!) 209 kg (460 lb 12.2 oz) IBW/kg (Calculated) : 54.7  Temp (24hrs), Avg:98.3 F (36.8 C), Min:98 F (36.7 C), Max:98.7 F (37.1 C)  Recent Labs  Lab 09/23/21 0421 09/24/21 0351 09/25/21 0023 09/25/21 2044 09/26/21 0352 09/28/21 0516  WBC 15.2* 12.7* 12.2*  --  10.2 8.7  CREATININE 0.54 0.43* 0.43*  --  0.50 0.39*  VANCOTROUGH  --   --   --  10*  --   --   VANCOPEAK  --   --  38  --   --   --      Estimated Creatinine Clearance: 139.1 mL/min (A) (by C-G formula based on SCr of 0.39 mg/dL (L)).    Allergies  Allergen Reactions   Furosemide Rash    Per [facility] records - rash possibly to IV furosemide  but tablets ok   Chlorhexidine Hives, Dermatitis, Rash and Other (See Comments)    Blisters right after CHG wipe down    Antimicrobials this admission: Azithro (5/20-5/21) CRO x1 in ED (5/20) VAN (5/20 >>   Dose adjustments this admission: 5/22: Vancomycin levels  on 1gm IV q12h (with 5th maintenance dose Vancomycin peak 5/22 at 2141 = 39 mcg/mL Vancomycin trough 5/23 at 0504 = 30 mcg/mL Vanc adj to '1750mg'$  IV q24h  5/24 Vanc random'@2126'$ = 15 mcg/ml   No chg  5/27 Vanc '1750mg'$  hung'@2222'$  -infuse over 2 hrs         5/28   Vanc P'@0023'$ =38  mcg/ml         5/28   Vanc T'@2044'$ =10 mcg/ml        -Vanc adj to 2000 mg IV q24h   Microbiology results: 5/20 BCx: BCID 4 of 4 vials MRSA 5/22 BCx: S aureus, MRSA 5/25: Bcx: L hand 2150: NG          Bcx: L hand 2204: +Staph aureus  5/20 Sputum: MRSA 5/20 Flu/Cov PCR: negative    Thank you for allowing pharmacy to be a part  of this patient's care.  Doreene Eland, PharmD, BCPS, BCIDP Work Cell: [phone] 09/29/2021 12:18 PM

## 2021-09-29 NOTE — TOC Progression Note (Signed)
Transition of Care [facility]) - Progression Note    Patient Details  Name: Krystal Francis MRN: [id number] Date of Birth: Sep 05, 1960  Transition of Care [facility]) CM/SW [place], RN Phone Number: 09/29/2021, 4:19 PM  Clinical Narrative:   Patient states she refuses PT/OT and wants to go home.  She says she would like a low bed, however the lowest bed that [facility] has is 35 inches and patient would need a specialized step stool to get into bed.  [facility] does not carry such a stool.  TOC researching for appropriate DME at this time.    Expected Discharge Plan: Skilled Nursing Facility Barriers to Discharge: Continued Medical Work up, SNF Pending bed offer, Ship broker  Expected Discharge Plan and Services Expected Discharge Plan: [place]                                               Social Determinants of Health (SDOH) Interventions    Readmission Risk Interventions     View : No data to display.

## 2021-09-29 NOTE — Progress Notes (Signed)
   Date of Admission:  09/15/2021   Loa Idler is a 61 y.o. female Principal Problem:   Multifocal pneumonia Active Problems:   Morbid obesity with body mass index of 70 and over in adult [facility])   HTN (hypertension)   Severe sepsis (HCC)   Hematuria   Iron deficiency anemia   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[place])   Pulmonary embolism ([place])   Urinary retention   MRSA bacteremia   Hypomagnesemia   Acute hypoxemic respiratory failure (HCC)   Arterial hypotension   Palliative care by specialist   Goals of care, counseling/discussion    Subjective: Rt arm is less swollen she says Able to lift it better Cough better No fever No sob  Medications:   enoxaparin (LOVENOX) injection  120 mg Subcutaneous Q12H   feeding supplement  237 mL Oral BID BM   iron polysaccharides  150 mg Oral Daily   magnesium oxide  400 mg Oral Daily   medroxyPROGESTERone  10 mg Oral BID   multivitamin with minerals  1 tablet Oral Daily   pantoprazole  40 mg Oral Daily   senna-docusate  2 tablet Oral BID   silver sulfADIAZINE   Topical BID    Objective: Vital signs in last 24 hours: Temp:  [98.3 F (36.8 C)-98.8 F (37.1 C)] 98.8 F (37.1 C) (06/01 2104) Pulse Rate:  [93-101] 97 (06/01 2104) Resp:  [17-20] 20 (06/01 2104) BP: (98-133)/(50-76) 101/50 (06/01 2104) SpO2:  [94 %-96 %] 94 % (06/01 2104)    PHYSICAL EXAM:  General: Alert, cooperative, no distress at rest , . Morbid obesity Lungs: b/l air entry Heart: s1s2. Abdomen: Soft, non-tender,not distended. Pannus- b/l groin intertrigo Extremities:rt arm swelling improved Softer  B/l lymphedema legs Left > rt Lymph: Cervical, supraclavicular normal. Neurologic: Grossly non-focal  Lab Results Recent Labs    09/28/21 0516  WBC 8.7  HGB 7.7*  HCT 26.4*  NA 133*  K 3.8  CL 102  CO2 24  BUN 10  CREATININE 0.39*     Microbiology: 09/17/21 4/4 MRSA 09/19/21- MRSA bacteremia 2/4 blood culture 09/22/21 MRSA 09/27/2021: Blood cultures 1 of 4   staph epidermidis MRSE   Assessment/Plan:  MRSA bacteremia.  Patient had central line at [facility] for 4 weeks.  Also has extensive thrombosis of the right upper extremity.  Very likely the thrombus is being seeded.  Because of persistent bacteremia from 5/20 until 09/22/2021   The initial plan was to get a TEE as she had bilateral pulmonary infiltrates to rule out infective endocarditis.  But Cardiology concerned about her risk of morbid obesity and being on oxygen. Continue vancomycin Will need for minimum 6 weeks Blood culture from 09/27/21- No MRSA Repeat CXR improving lung infiltrates  MRSE 1 of 4 on 09/27/21 blood culture is a contaminant   Right upper extremity DVT.  Underwent thrombectomy, swelling much better Thrombus culture pending  AGEP-allergic reaction to chlorhexidine wipes.  Has resolved  Anemia with history of recent vaginal bleeding.  Has been on Provera Needed multiple blood transfusion in March 2023  Recent PE with right strain Foley catheter removed and patient has pure wick  Lymphedema legs.  Left worse than the right.  Discussed the management with the patient .

## 2021-09-30 DIAGNOSIS — J189 Pneumonia, unspecified organism: Secondary | ICD-10-CM | POA: Diagnosis not present

## 2021-09-30 LAB — CBC
HCT: 25.1 % — ABNORMAL LOW (ref 36.0–46.0)
Hemoglobin: 7.3 g/dL — ABNORMAL LOW (ref 12.0–15.0)
MCH: 22.3 pg — ABNORMAL LOW (ref 26.0–34.0)
MCHC: 29.1 g/dL — ABNORMAL LOW (ref 30.0–36.0)
MCV: 76.5 fL — ABNORMAL LOW (ref 80.0–100.0)
Platelets: 266 10*3/uL (ref 150–400)
RBC: 3.28 MIL/uL — ABNORMAL LOW (ref 3.87–5.11)
RDW: 25 % — ABNORMAL HIGH (ref 11.5–15.5)
WBC: 9.5 10*3/uL (ref 4.0–10.5)
nRBC: 0.3 % — ABNORMAL HIGH (ref 0.0–0.2)

## 2021-09-30 LAB — BASIC METABOLIC PANEL
Anion gap: 5 (ref 5–15)
BUN: 9 mg/dL (ref 6–20)
CO2: 23 mmol/L (ref 22–32)
Calcium: 8.2 mg/dL — ABNORMAL LOW (ref 8.9–10.3)
Chloride: 107 mmol/L (ref 98–111)
Creatinine, Ser: 0.62 mg/dL (ref 0.44–1.00)
GFR, Estimated: >60 mL/min (ref >60)
Glucose, Bld: 108 mg/dL — ABNORMAL HIGH (ref 70–99)
Potassium: 3.8 mmol/L (ref 3.5–5.1)
Sodium: 135 mmol/L (ref 135–145)

## 2021-09-30 LAB — PREPARE RBC (CROSSMATCH)

## 2021-09-30 LAB — HEPARIN ANTI-XA: Heparin LMW: 0.73 IU/mL

## 2021-09-30 LAB — CULTURE, BLOOD (ROUTINE X 2)

## 2021-09-30 LAB — VANCOMYCIN, RANDOM: Vancomycin Rm: 12 ug/mL

## 2021-09-30 LAB — VANCOMYCIN, PEAK: Vancomycin Pk: 26 ug/mL — ABNORMAL LOW (ref 30–40)

## 2021-09-30 LAB — MAGNESIUM: Magnesium: 1.7 mg/dL (ref 1.7–2.4)

## 2021-09-30 MED ORDER — SODIUM CHLORIDE 0.9% IV SOLUTION
Freq: Once | INTRAVENOUS | Status: AC
Start: 2021-09-30 — End: 2021-09-30

## 2021-09-30 NOTE — Progress Notes (Addendum)
Progress Note   Patient: Krystal Francis TIW:[id number] DOB: 1960/07/08 DOA: 09/15/2021     13 DOS: the patient was seen and examined on 09/30/2021   Brief hospital course: Malyssa Maris is a 61 y.o. female with medical history significant of RV failure, hypertension, GERD, PE on Lovenox, iron deficiency anemia, urinary retention with Foley catheter placement, uterine bleeding, morbid obesity with BMI 78.27, who presents with shortness of breath. Patient had a significant tachycardia, tachypnea, leukocytosis and hypoxemia with oxygen saturation 87%, she met severe sepsis criteria. Her blood culture positive for MRSA, CT angiogram showed multifocal pneumonia with evidence of pulmonary hypertension. She was seen by ID, vancomycin was started.  Assessment and Plan:  Severe sepsis secondary to multifocal pneumonia. Multifocal pneumonia secondary to MRSA. MRSA septicemia. Acute hypoxemic respiratory failure secondary to pneumonia. Ruled in, POA Repeated blood culture drawn on 5/22 and 5/25 still positive for staph.   2nd repeat Bcx on 5/30 negative to date (ex contaminant MRSE in 1 of 4 bottles) TTE negative for thrombus --ID consulted --Continue IV vancomycin (will need minimum 6 weeks) --Follow repeat cultures to final --Follow RUE thrombus cultures --Cardiology consulted for TEE - to reassess risk/benefit of performing TEE when pt is closer to discharge given her morbid obesity, O2 requirements, felt higher risk   Hypotension -improved Partially due to severe right-sided congestive heart failure.   Treated with albumin, midodrine.  Briefly treated in ICU, was started on IV solumedrol.   Blood pressure better, transferred back to PCU on 5/23.   Solumedrol d/c'ed. --monitor BP --maintain MAP>65  Recent pulmonary emboli On full dose Lovenox PTA, was switched to heparin gtt due to concern of hematuria, this resolved. --cont full dose Lovenox   Right arm swelling 2/2 RUE DVT --pt with severe  edema of RUE, and found to have extensive occlusive thrombus  --cont home Lovenox --Vascular consulted  --S/P thrombectomy 5/31 due to concern of MRSA seeding from the thrombus   Acute blood loss anemia with iron deficient anemia. Francis hematuria secondary to indwelling Foley catheter. Patient hemoglobin has been low, but no additional hematuria. Due to severe right-sided congestive heart failure, pt received 1 unit of PRBC. 6/2: Hbg trending down 7.7>>7.3 today Plan: --cont iron supplement --Monitor Hgb, transfuse if < 7.0   Hypomagnesemia.  Resolved w replacement Hypokalemia.  Resolved w replacement --monitor and replete PRN   Hyponatremia - improved. Monitor BMP.  Morbid obesity with BMI 78.27. Complicates overall care and prognosis.  Recommend lifestyle modifications including physical activity and diet for weight loss and overall [place]-term health.    Chronic right-sided congestive heart failure. Assess daily for diuretic needs.   Left leg severe peripheral arterial disease. Patient has leg pain at rest, duplex ultrasound showed suspected significant peripheral vascular disease.  Patient was seen by vascular surgery, no intervention planned due to bacteremia.   --tramadol PRN --No need for IV opioids   Contact dermatitis  Was wiped down with CHG wipes per Infectious disease protocol and developed blistering to right arm and elbow on both upper arm and forearm.  Improved. --wound care per order   Foley cath, POA, removed --Pt presented with Foley cath.  Per pt, Foley cath was inserted for keeping her skin dry, not for retention.  Removed on 5/25.         Subjective: Patient awake resting in bed when seen on rounds today.  She denies having fevers chills, chest pain shortness of breath dizziness lightheaded palpitations.  Says overall feeling okay.  No acute complaints today.  Request that I call her sister to update.  Physical Exam: Vitals:   09/29/21 2104 09/30/21  0512 09/30/21 0842 09/30/21 1539  BP: (!) 101/50 115/61 (!) 111/57 122/66  Pulse: 97 (!) 106 (!) 103 94  Resp: 20 18 18 19   Temp: 98.8 F (37.1 C) 99.2 F (37.3 C) 98 F (36.7 C)   TempSrc: Oral  Oral   SpO2: 94% 93% 92% 95%  Weight:      Height:         Data Reviewed:  Notable labs: Glucose 108, calcium 8.2, hemoglobin 7.3 down from 7.7, Vanco level 26    Family Communication: Updated Sister Bonnita Nasuti by phone this afternoon  Disposition: Status is: Inpatient Remains inpatient appropriate because: Ongoing evaluation not appropriate for the outpatient setting, on IV antibiotics with cultures pending.  Will require SNF placement.   Planned Discharge Destination: Skilled nursing facility    Time spent: 35 minutes  Author: Ezekiel Slocumb, DO 09/30/2021 3:51 PM  For on call review [email or web address].

## 2021-09-30 NOTE — Consult Note (Signed)
Pharmacy Antibiotic Note  Krystal Francis is a 61 y.o. female w/ h/o RV failure, HTN, GERD, PE PTA on Lovenox, iron deficiency anemia, urinary retention with Foley catheter placement, uterine bleeding, morbid obesity with BMI 78.27, who presents with shortness of breath > w/u c/w PE & multifocal PNA. Pt admitted on 09/15/2021 with pneumonia and bacteremia & BCID resulted with MRSA 4 of 4 vials on 5/20.  Pharmacy has been consulted for escalation of abx to Vancomycin dosing.   -planning 6 weeks IV Vancomycin per ID  Day # 14 vancomycin MRSA bacteremia Renal: SCr stable Repeat blood cxs 5/25: 1 still with Staph aureus Repeat blood cx 5/30 with 1/4 GPC  but BCID detected MRSE and not MRSA  Vancomycin levels: 6/1 vancomycin 2gm IV q24h given at 22:19 6/2 vanco peak = 26 mcg/ml at  02:02 6/2 random vancomycin level =  at    Plan:    Continue vancomycin  to 2000 mg IV every 24 hours. 2nd level pending for this evening to perform pharmacokinetics an calculate trough/AUC       AUC:560       Cmin; 10.3 Check vancomycin levels with tonight's dose - peak and random level Follow renal function  Follow 5/30 blood cx Await ability to place PICC once Blood cx remain negative Await OPAT and ability for patient to give antibiotics at home (? Anyone to assist)   Height: '5\' 4"'$  (162.6 cm) Weight: (!) 209 kg (460 lb 12.2 oz) IBW/kg (Calculated) : 54.7  Temp (24hrs), Avg:98.8 F (37.1 C), Min:98.3 F (36.8 C), Max:99.2 F (37.3 C)  Recent Labs  Lab 09/24/21 0351 09/25/21 0023 09/25/21 2044 09/26/21 0352 09/28/21 0516 09/30/21 0202  WBC 12.7* 12.2*  --  10.2 8.7 9.5  CREATININE 0.43* 0.43*  --  0.50 0.39* 0.62  VANCOTROUGH  --   --  10*  --   --   --   VANCOPEAK  --  38  --   --   --  26*     Estimated Creatinine Clearance: 139.1 mL/min (by C-G formula based on SCr of 0.62 mg/dL).    Allergies  Allergen Reactions   Furosemide Rash    Per [facility] records - rash possibly to IV furosemide but  tablets ok   Chlorhexidine Hives, Dermatitis, Rash and Other (See Comments)    Blisters right after CHG wipe down    Antimicrobials this admission: Azithro (5/20-5/21) CRO x1 in ED (5/20) VAN (5/20 >>   Dose adjustments this admission: 5/22: Vancomycin levels  on 1gm IV q12h (with 5th maintenance dose Vancomycin peak 5/22 at 2141 = 39 mcg/mL Vancomycin trough 5/23 at 0504 = 30 mcg/mL Vanc adj to '1750mg'$  IV q24h  5/24 Vanc random'@2126'$ = 15 mcg/ml   No chg  5/27 Vanc '1750mg'$  hung'@2222'$  -infuse over 2 hrs         5/28   Vanc P'@0023'$ =38  mcg/ml         5/28   Vanc T'@2044'$ =10 mcg/ml        -Vanc adj to 2000 mg IV q24h   Microbiology results: 5/20 BCx: BCID 4 of 4 vials MRSA 5/22 BCx: S aureus, MRSA 5/25: Bcx: L hand 2150: NG          Bcx: L hand 2204: +Staph aureus  5/20 Sputum: MRSA 5/20 Flu/Cov PCR: negative    Thank you for allowing pharmacy to be a part of this patient's care.  Doreene Eland, PharmD, BCPS, BCIDP Work Cell: [phone] 09/30/2021 8:35 AM

## 2021-09-30 NOTE — Consult Note (Signed)
NAME:  Krystal Francis, MRN:  [id number], DOB:  10/29/1960, LOS: 47 ADMISSION DATE:  09/15/2021, CONSULTATION DATE:  09/20/21 REFERRING MD:  Morton Amy, NP, CHIEF COMPLAINT:  Shortness of breath  History of Present Illness:  61 year old female presenting to [facility] ED on 09/15/2021 from home via EMS with complaints of hematuria and her chronic Foley catheter over the last couple of days.  ED course: 09/15/2021: patient was worked up for possible UTI, vaginal bleeding and/or Foley catheter obstruction or malfunction.  Foley catheter was replaced with immediate return of clear yellow urine and suspicion that Foley balloon may have irritated urethra causing some local bleeding.  Pelvic exam revealed no vaginal bleeding.  Patient was stabilized for discharge however she states she was unable to go home.  She is unable to get in and out of bed & complete ADLs.  EDP placed TOC consult as well as PT and OT consults. 09/17/2021: EDP called bedside after patient has been boarding for 35 hours due to complaints of shortness of breath with associated tachycardia.  Patient had not received any outpatient medications including her Lasix or Lovenox.  CT angio performed to rule out pulmonary embolus, limited due to body habitus but does not show any large PEs.  CT chest however concerning for multifocal pneumonia with lab work revealing leukocytosis.  Sepsis work-up completed with cefepime and vancomycin and 1 L IV fluid bolus.  TRH consulted for admission.   09/30/21- patient reports freezing despite having external bair hugger warming system.  She has had constipation with abdominal discomfort.  We discussed severe symptomatic anemia and she is interested in having blood transfusion. Due to severe symptomatic anemia and possible ongoing blood loss will order Prbc transfusion.   CT angio chest 09/17/21 : Technically limited examination without evidence of large central pulmonary embolus. Lobar and peripheral pulmonary arterial  vasculature is not adequately opacified on this examination to definitively exclude the presence of acute pulmonary embolus. Morphologic changes in keeping with pulmonary arterial hypertension. Multifocal pulmonary consolidation in keeping with multifocal pneumonic consolidation in the appropriate clinical setting. No central obstructing lesion. Probable reactive right hilar adenopathy.  Enhancing right axillary adenopathy demonstrating perinodal infiltration in keeping with acute infection, inflammation or neoplastic infiltration. Asymmetric right axillary edema. Correlation with clinical examination including right breast examination is recommended.   US arterial left lower extremity 09/18/21: Suspected hemodynamically significant narrowings involving inflow, outflow and tibial runoff of the left lower extremity. Further evaluation with CTA run-off could be performed as indicated.  PCCM consulted for additional evaluation with prolonged hospitalization having multifocal pneumonia    Pertinent  Medical History  RV failure HTN GERD PE on lovenox Iron deficiency anemia Urinary retention with chronic foley catheter Uterine bleeding Morbid obesity BMI 78.27 HTN  Significant Hospital Events: Including procedures, antibiotic start and stop dates in addition to other pertinent events   09/17/21: admit to inpatient with sepsis s/t suspected HCAP. ID consulted, BCx + for MRSA 09/19/21: vascular consulted for severe LLE PVD 09/20/21: overnight transfer to ICU/SDU for potential vasopressors   Objective   Blood pressure 122/66, pulse 94, temperature 98 F (36.7 C), temperature source Oral, resp. rate 19, height '5\' 4"'$  (1.626 m), weight (!) 209 kg, SpO2 95 %.        Intake/Output Summary (Last 24 hours) at 09/30/2021 1735 Last data filed at 09/30/2021 1145 Gross per 24 hour  Intake 640 ml  Output 900 ml  Net -260 ml    Filed Weights   09/21/21  0737 09/27/21 0500 09/28/21 0551  Weight:  (!) 208.2 kg (!) 208.2 kg (!) 209 kg    Examination: General: Adult female, acutely ill, lying in bed, NAD HEENT: MM pink/moist, anicteric, atraumatic, neck supple Neuro: A&O x 4, able to follow commands, PERRL +3, MAE CV: s1s2 RRR, ST on monitor, no r/m/g Pulm: Regular, non labored on  , breath sounds clear-BUL & diminished-BLL GI: soft, obese, non tender, bs x 4 GU: foley in place with clear yellow urine Skin: small blisters covering body, RUE red/swollen/painful   Extremities: warm/dry, pulses + 2 R/P, +2 edema noted RUE  Resolved Hospital Problem list     Assessment & Plan:   Sepsis MRSA bacteremia & RV failure  Present on admission  Patient asymptomatic, only able to obtain BP from ankle due to body habitus and skin integrity - agree with midodrine - peripheral vasopressors PRN to maintain MAP > 60 - continuous cardiac monitoring  Multifocal pneumonia Component Ref Range & Units 3 d ago 11 d ago 13 d ago  Enterococcus faecalis NOT DETECTED NOT DETECTED  NOT DETECTED  NOT DETECTED   Enterococcus Faecium NOT DETECTED NOT DETECTED  NOT DETECTED  NOT DETECTED   Listeria monocytogenes NOT DETECTED NOT DETECTED  NOT DETECTED  NOT DETECTED   Staphylococcus species NOT DETECTED DETECTED Abnormal   DETECTED Abnormal  CM  DETECTED Abnormal  CM   Comment: CRITICAL RESULT CALLED TO, READ BACK BY AND VERIFIED WITH:  ALEX CHAPPELL AT 1540 ON 09/28/21 BY SS   Staphylococcus aureus (BCID) NOT DETECTED NOT DETECTED  DETECTED Abnormal  CM  DETECTED Abnormal  CM   Staphylococcus epidermidis NOT DETECTED DETECTED Abnormal       RUE edema and blisters secondary to suspected allergic reaction to CHG wipes vs DVT  Per patient similar thing happened at [facility] after being exposed to those wipes - RUE [place] to r/o DVT ordered, patient on heparin drip - solu-medrol 0.5 mg/kg IV ordered to assist with possible allergic rxn - benadryl PRN   Recurrent pulmonary emboli    Patient with severe anemia  now  Best Practice (right click and "Reselect all SmartList Selections" daily)  Diet/type: Regular consistency (see orders) DVT prophylaxis: systemic heparin GI prophylaxis: PPI Lines: N/A Foley:  Yes, and it is still needed Code Status:  full code Last date of multidisciplinary goals of care discussion [per primary]  Labs   CBC: Recent Labs  Lab 09/24/21 0351 09/25/21 0023 09/26/21 0352 09/28/21 0516 09/30/21 0202  WBC 12.7* 12.2* 10.2 8.7 9.5  HGB 7.9* 7.6* 7.6* 7.7* 7.3*  HCT 27.1* 26.2* 26.2* 26.4* 25.1*  MCV 75.1* 75.9* 75.3* 74.8* 76.5*  PLT 246 229 250 281 266     Basic Metabolic Panel: Recent Labs  Lab 09/24/21 0351 09/25/21 0023 09/26/21 0352 09/28/21 0516 09/30/21 0202  NA 136 135 135 133* 135  K 3.9 3.8 4.0 3.8 3.8  CL 104 103 104 102 107  CO2 '26 26 26 24 23  '$ GLUCOSE 124* 110* 100* 97 108*  BUN '20 18 13 10 9  '$ CREATININE 0.43* 0.43* 0.50 0.39* 0.62  CALCIUM 8.3* 8.1* 8.3* 8.5* 8.2*  MG 1.8 1.7 1.6* 1.8 1.7    GFR: Estimated Creatinine Clearance: 139.1 mL/min (by C-G formula based on SCr of 0.62 mg/dL). Recent Labs  Lab 09/25/21 0023 09/26/21 0352 09/28/21 0516 09/30/21 0202  WBC 12.2* 10.2 8.7 9.5     Liver Function Tests: No results for input(s): AST, ALT, ALKPHOS, BILITOT, PROT,  ALBUMIN in the last 168 hours. No results for input(s): LIPASE, AMYLASE in the last 168 hours. No results for input(s): AMMONIA in the last 168 hours.  ABG No results found for: PHART, PCO2ART, PO2ART, HCO3, TCO2, ACIDBASEDEF, O2SAT   Coagulation Profile: No results for input(s): INR, PROTIME in the last 168 hours.   Cardiac Enzymes: No results for input(s): CKTOTAL, CKMB, CKMBINDEX, TROPONINI in the last 168 hours.  HbA1C: No results found for: HGBA1C  CBG: Recent Labs  Lab 09/24/21 2054  GLUCAP 117*    Review of Systems: positives in BOLD  Gen: Denies fever, chills, weight change, fatigue, night sweats HEENT: Denies blurred vision, double  vision, hearing loss, tinnitus, sinus congestion, rhinorrhea, sore throat, neck stiffness, dysphagia PULM: Denies shortness of breath, cough, sputum production, hemoptysis, wheezing CV: Denies chest pain, edema, orthopnea, paroxysmal nocturnal dyspnea, palpitations GI: Denies abdominal pain, nausea, vomiting, diarrhea, hematochezia, melena, constipation, change in bowel habits GU: Denies dysuria, hematuria, polyuria, oliguria, urethral discharge Endocrine: Denies hot or cold intolerance, polyuria, polyphagia or appetite change Derm: Denies rash, swelling, pain, dry skin, scaling or peeling skin change Heme: Denies easy bruising, bleeding, bleeding gums Neuro: Denies headache, numbness, weakness, slurred speech, loss of memory or consciousness  Past Medical History:  She,  has a past medical history of Anemia, unspecified and Hypertension.   Surgical History:   Past Surgical History:  Procedure Laterality Date   DILATION AND CURETTAGE OF UTERUS     PERIPHERAL VASCULAR THROMBECTOMY Right 09/28/2021   Procedure: PERIPHERAL VASCULAR THROMBECTOMY;  Surgeon: Katha Cabal, MD;  Location: [facility];  Service: Cardiovascular;  Laterality: Right;   THROMBECTOMY BRACHIAL ARTERY Right 09/28/2021   Procedure: THROMBECTOMY RIGHT UPPER EXTREMITY;  Surgeon: Katha Cabal, MD;  Location: [facility] ORS;  Service: Vascular;  Laterality: Right;     Social History:   reports that she has never smoked. She has never used smokeless tobacco. She reports that she does not drink alcohol and does not use drugs.   Family History:  Her family history includes Diabetes Mellitus I in her father; Hypertension in her father and mother.   Allergies Allergies  Allergen Reactions   Furosemide Rash    Per [facility] records - rash possibly to IV furosemide but tablets ok   Chlorhexidine Hives, Dermatitis, Rash and Other (See Comments)    Blisters right after CHG wipe down     Home Medications  Prior to  Admission medications   Medication Sig Start Date End Date Taking? Authorizing Provider  enoxaparin (LOVENOX) 120 MG/0.8ML injection Inject 120 mg into the skin daily. 09/01/21  Yes [provider]  ferrous sulfate 325 (65 FE) MG EC tablet Take 1 tablet (325 mg total) by mouth 3 (three) times daily with meals. 07/21/21 07/21/22 Yes Lorella Nimrod, MD  furosemide (LASIX) 40 MG tablet Take 40 mg by mouth daily. 07/31/21  Yes [provider]  magnesium oxide (MAG-OX) 400 (240 Mg) MG tablet Take 1 tablet by mouth daily. 06/17/21  Yes [provider]  medroxyPROGESTERone (PROVERA) 10 MG tablet Take 10 mg by mouth in the morning and at bedtime. 07/12/21  Yes [provider]  pantoprazole (PROTONIX) 40 MG tablet Take 40 mg by mouth daily. 09/01/21  Yes [provider]  torsemide (DEMADEX) 20 MG tablet Take 20 mg by mouth daily. 09/01/21  Yes [provider]  vitamin B-12 (CYANOCOBALAMIN) 1000 MCG tablet Take by mouth. 07/12/21  Yes [provider]  Ottie Glazier, M.D.  Pulmonary & [place]

## 2021-09-30 NOTE — Consult Note (Addendum)
Pharmacy Antibiotic Note  Krystal Francis is a 61 y.o. female w/ h/o RV failure, HTN, GERD, PE PTA on Lovenox, iron deficiency anemia, urinary retention with Foley catheter placement, uterine bleeding, morbid obesity with BMI 78.27, who presents with shortness of breath > w/u c/w PE & multifocal PNA. Pt admitted on 09/15/2021 with pneumonia and bacteremia & BCID resulted with MRSA 4 of 4 vials on 5/20.  Pharmacy has been consulted for escalation of abx to Vancomycin dosing.   -planning 6 weeks IV Vancomycin per ID  Day # 14 vancomycin MRSA bacteremia Renal: SCr stable Repeat blood cxs 5/25: 1 still with Staph aureus Repeat blood cx 5/30 with 1/4 GPC  but BCID detected MRSE and not MRSA  Vancomycin levels: 6/1 vancomycin 2gm IV q24h given at 22:19 6/2 vanco peak = 26 mcg/ml at  02:02 6/2 random vancomycin level = 12.0 at 18:29    Plan:    Continue vancomycin  to 2000 mg IV every 24 hours.       Calculated AUC: 424.9       Cmin; 10.0 Follow renal function  Follow 5/30 blood cx Await ability to place PICC once Blood cx remain negative Await OPAT and ability for patient to give antibiotics at home (? Anyone to assist)   Height: '5\' 4"'$  (162.6 cm) Weight: (!) 209 kg (460 lb 12.2 oz) IBW/kg (Calculated) : 54.7  Temp (24hrs), Avg:98.7 F (37.1 C), Min:98 F (36.7 C), Max:99.2 F (37.3 C)  Recent Labs  Lab 09/24/21 0351 09/25/21 0023 09/25/21 2044 09/26/21 0352 09/28/21 0516 09/30/21 0202 09/30/21 1829  WBC 12.7* 12.2*  --  10.2 8.7 9.5  --   CREATININE 0.43* 0.43*  --  0.50 0.39* 0.62  --   VANCOTROUGH  --   --  10*  --   --   --   --   VANCOPEAK  --  38  --   --   --  26*  --   VANCORANDOM  --   --   --   --   --   --  12     Estimated Creatinine Clearance: 139.1 mL/min (by C-G formula based on SCr of 0.62 mg/dL).    Allergies  Allergen Reactions   Furosemide Rash    Per [facility] records - rash possibly to IV furosemide but tablets ok   Chlorhexidine Hives, Dermatitis, Rash  and Other (See Comments)    Blisters right after CHG wipe down    Antimicrobials this admission: Azithro (5/20-5/21) CRO x1 in ED (5/20) VAN (5/20 >>   Dose adjustments this admission: 5/22: Vancomycin levels  on 1gm IV q12h (with 5th maintenance dose Vancomycin peak 5/22 at 2141 = 39 mcg/mL Vancomycin trough 5/23 at 0504 = 30 mcg/mL Vanc adj to '1750mg'$  IV q24h  5/24 Vanc random'@2126'$ = 15 mcg/ml   No chg  5/27 Vanc '1750mg'$  hung'@2222'$  -infuse over 2 hrs         5/28   Vanc P'@0023'$ =38  mcg/ml         5/28   Vanc T'@2044'$ =10 mcg/ml        -Vanc adj to 2000 mg IV q24h   Microbiology results: 5/20 BCx: BCID 4 of 4 vials MRSA 5/22 BCx: S aureus, MRSA 5/25: Bcx: L hand 2150: NG          Bcx: L hand 2204: +Staph aureus  5/20 Sputum: MRSA 5/20 Flu/Cov PCR: negative    Thank you for allowing pharmacy to be a part of  this patient's care.  Doreene Eland, PharmD, BCPS, BCIDP Work Cell: [phone] 09/30/2021 7:58 PM

## 2021-10-01 DIAGNOSIS — J189 Pneumonia, unspecified organism: Secondary | ICD-10-CM | POA: Diagnosis not present

## 2021-10-01 LAB — TYPE AND SCREEN
ABO/RH(D): O POS
Antibody Screen: NEGATIVE
Unit division: 0

## 2021-10-01 LAB — CBC WITH DIFFERENTIAL/PLATELET
Abs Immature Granulocytes: 0.05 10*3/uL (ref 0.00–0.07)
Basophils Absolute: 0 10*3/uL (ref 0.0–0.1)
Basophils Relative: 1 %
Eosinophils Absolute: 0.2 10*3/uL (ref 0.0–0.5)
Eosinophils Relative: 3 %
HCT: 26.9 % — ABNORMAL LOW (ref 36.0–46.0)
Hemoglobin: 8 g/dL — ABNORMAL LOW (ref 12.0–15.0)
Immature Granulocytes: 1 %
Lymphocytes Relative: 23 %
Lymphs Abs: 1.8 10*3/uL (ref 0.7–4.0)
MCH: 22.5 pg — ABNORMAL LOW (ref 26.0–34.0)
MCHC: 29.7 g/dL — ABNORMAL LOW (ref 30.0–36.0)
MCV: 75.8 fL — ABNORMAL LOW (ref 80.0–100.0)
Monocytes Absolute: 0.6 10*3/uL (ref 0.1–1.0)
Monocytes Relative: 7 %
Neutro Abs: 5.2 10*3/uL (ref 1.7–7.7)
Neutrophils Relative %: 65 %
Platelets: 262 10*3/uL (ref 150–400)
RBC: 3.55 MIL/uL — ABNORMAL LOW (ref 3.87–5.11)
RDW: 23.9 % — ABNORMAL HIGH (ref 11.5–15.5)
WBC: 7.9 10*3/uL (ref 4.0–10.5)
nRBC: 0 % (ref 0.0–0.2)

## 2021-10-01 LAB — BPAM RBC
Blood Product Expiration Date: 202307062359
ISSUE DATE / TIME: 202306022107
Unit Type and Rh: 5100

## 2021-10-01 NOTE — Progress Notes (Signed)
Progress Note   Patient: Krystal Francis JQB:[id number] DOB: 1961-01-07 DOA: 09/15/2021     14 DOS: the patient was seen and examined on 10/01/2021   Brief hospital course: Krystal Francis is a 61 y.o. female with medical history significant of RV failure, hypertension, GERD, PE on Lovenox, iron deficiency anemia, urinary retention with Foley catheter placement, uterine bleeding, morbid obesity with BMI 78.27, who presents with shortness of breath. Patient had a significant tachycardia, tachypnea, leukocytosis and hypoxemia with oxygen saturation 87%, she met severe sepsis criteria. Her blood culture positive for MRSA, CT angiogram showed multifocal pneumonia with evidence of pulmonary hypertension. She was seen by ID, vancomycin was started.  Assessment and Plan:  Severe sepsis secondary to multifocal pneumonia. Multifocal pneumonia secondary to MRSA. MRSA septicemia. Acute hypoxemic respiratory failure secondary to pneumonia. Ruled in, POA Repeated blood culture drawn on 5/22 and 5/25 still positive for staph.   2nd repeat Bcx on 5/30 negative to date (ex contaminant MRSE in 1 of 4 bottles) TTE negative for thrombus --ID consulted --Continue IV vancomycin (will need minimum 6 weeks) --Follow repeat cultures to final --Follow RUE thrombus cultures --Cardiology consulted for TEE - to reassess risk/benefit of performing TEE when pt is closer to discharge given her morbid obesity, O2 requirements, felt higher risk   Hypotension -improved Partially due to severe right-sided congestive heart failure.   Treated with albumin, midodrine.  Briefly treated in ICU, was started on IV solumedrol.   Blood pressure better, transferred back to PCU on 5/23.   Solumedrol d/c'ed. --monitor BP --maintain MAP>65  Recent pulmonary emboli On full dose Lovenox PTA, was switched to heparin gtt due to concern of hematuria, this resolved. --cont full dose Lovenox   Right arm swelling 2/2 RUE DVT --pt with severe  edema of RUE, and found to have extensive occlusive thrombus  --cont home Lovenox --Vascular consulted  --S/P thrombectomy 5/31 due to concern of MRSA seeding from the thrombus   Acute blood loss anemia with iron deficient anemia. Gross hematuria secondary to indwelling Foley catheter. Patient hemoglobin has been low, but no additional hematuria. Due to severe right-sided congestive heart failure, pt received 1 unit of PRBC. 6/2: Hbg trending down 7.7>>7.3 today.  Pulmonology ordered 1 unit PRBCs transfusion 6/3: Hemoglobin 8.0 Plan: --cont iron supplement --Monitor Hgb, transfuse if < 7.0   Hypomagnesemia.  Resolved w replacement Hypokalemia.  Resolved w replacement --monitor and replete PRN   Hyponatremia -resolved. Monitor BMP.  Morbid obesity with BMI 78.27. Complicates overall care and prognosis.  Recommend lifestyle modifications including physical activity and diet for weight loss and overall [place]-term health.    Chronic right-sided congestive heart failure. Assess daily for diuretic needs. Volume status difficult to assess due to body habitus, no distal lower extremity edema   Left leg severe peripheral arterial disease. Patient has leg pain at rest, duplex ultrasound showed suspected significant peripheral vascular disease.  Patient was seen by vascular surgery, no intervention planned due to bacteremia.   --tramadol PRN --No need for IV opioids   Contact dermatitis  Was wiped down with CHG wipes per Infectious disease protocol and developed blistering to right arm and elbow on both upper arm and forearm.  Improved. --wound care per order   Foley cath, POA, removed --Pt presented with Foley cath.  Per pt, Foley cath was inserted for keeping her skin dry, not for retention.  Removed on 5/25.         Subjective: Patient awake resting in bed when seen  on rounds today.  She reports feeling okay and has no acute complaints today.  Confirms that she does want to  return home, declines to go to SNF.  I asked who will be able to help her at home and she said that she does not need any help, only needs a bed that she is able to get in and out of.  She says her mom used to come in to help her out when needed.  She states she can manage on her own.  Physical Exam: Vitals:   10/01/21 0500 10/01/21 0640 10/01/21 0838 10/01/21 1219  BP:  (!) 93/47 122/67 119/70  Pulse:  99 (!) 101 92  Resp:  _0 Temp:  98.7 F (37.1 C) 98 F (36.7 C) 98.2 F (36.8 C)  TempSrc:      SpO2:  95% 96% 97%  Weight: (!) 248 kg     Height:         Data Reviewed:  Notable labs: Hemoglobin 8.0 status post transfusion of 1 unit PRBCs yesterday evening for hemoglobin 7.3    Family Communication: Updated Sister Krystal Francis by phone afternoon 6/2  Disposition: Status is: Inpatient Remains inpatient appropriate because: Ongoing evaluation not appropriate for the outpatient setting, on IV antibiotics with cultures pending.    SNF recommended for rehab, patient declining, wants to return home with home health but needs hospital bed and other DME   Planned Discharge Destination: Skilled nursing facility    Time spent: 35 minutes  Author: Ezekiel Slocumb, DO 10/01/2021 2:07 PM  For on call review [email or web address].

## 2021-10-01 NOTE — Consult Note (Signed)
NAME:  Krystal Francis, MRN:  [id number], DOB:  21-Apr-1961, LOS: 66 ADMISSION DATE:  09/15/2021, CONSULTATION DATE:  09/20/21 REFERRING MD:  Morton Amy, NP, CHIEF COMPLAINT:  Shortness of breath  History of Present Illness:  61 year old female presenting to [facility] ED on 09/15/2021 from home via EMS with complaints of hematuria and her chronic Foley catheter over the last couple of days.  ED course: 09/15/2021: patient was worked up for possible UTI, vaginal bleeding and/or Foley catheter obstruction or malfunction.  Foley catheter was replaced with immediate return of clear yellow urine and suspicion that Foley balloon may have irritated urethra causing some local bleeding.  Pelvic exam revealed no vaginal bleeding.  Patient was stabilized for discharge however she states she was unable to go home.  She is unable to get in and out of bed & complete ADLs.  EDP placed TOC consult as well as PT and OT consults. 09/17/2021: EDP called bedside after patient has been boarding for 35 hours due to complaints of shortness of breath with associated tachycardia.  Patient had not received any outpatient medications including her Lasix or Lovenox.  CT angio performed to rule out pulmonary embolus, limited due to body habitus but does not show any large PEs.  CT chest however concerning for multifocal pneumonia with lab work revealing leukocytosis.  Sepsis work-up completed with cefepime and vancomycin and 1 L IV fluid bolus.  TRH consulted for admission.   09/30/21- patient reports freezing despite having external bair hugger warming system.  She has had constipation with abdominal discomfort.  We discussed severe symptomatic anemia and she is interested in having blood transfusion. Due to severe symptomatic anemia and possible ongoing blood loss will order Prbc transfusion.   10/01/21- patient is improved.  She is no longer feeling cold post 1 unit prbc.  She had bath this am. We can dc 2nd unit since theres shortage and patient  is feeling better. From pulmonary standpoint shes improving.   CT angio chest 09/17/21 : Technically limited examination without evidence of large central pulmonary embolus. Lobar and peripheral pulmonary arterial vasculature is not adequately opacified on this examination to definitively exclude the presence of acute pulmonary embolus. Morphologic changes in keeping with pulmonary arterial hypertension. Multifocal pulmonary consolidation in keeping with multifocal pneumonic consolidation in the appropriate clinical setting. No central obstructing lesion. Probable reactive right hilar adenopathy.  Enhancing right axillary adenopathy demonstrating perinodal infiltration in keeping with acute infection, inflammation or neoplastic infiltration. Asymmetric right axillary edema. Correlation with clinical examination including right breast examination is recommended.   US arterial left lower extremity 09/18/21: Suspected hemodynamically significant narrowings involving inflow, outflow and tibial runoff of the left lower extremity. Further evaluation with CTA run-off could be performed as indicated.  PCCM consulted for additional evaluation with prolonged hospitalization having multifocal pneumonia    Pertinent  Medical History  RV failure HTN GERD PE on lovenox Iron deficiency anemia Urinary retention with chronic foley catheter Uterine bleeding Morbid obesity BMI 78.27 HTN  Significant Hospital Events: Including procedures, antibiotic start and stop dates in addition to other pertinent events   09/17/21: admit to inpatient with sepsis s/t suspected HCAP. ID consulted, BCx + for MRSA 09/19/21: vascular consulted for severe LLE PVD 09/20/21: overnight transfer to ICU/SDU for potential vasopressors   Objective   Blood pressure 122/67, pulse (!) 101, temperature 98 F (36.7 C), resp. rate 18, height '5\' 4"'$  (1.626 m), weight (!) 248 kg, SpO2 96 %.  Intake/Output Summary (Last 24  hours) at 10/01/2021 0920 Last data filed at 10/01/2021 [id number] Gross per 24 hour  Intake 840 ml  Output 1300 ml  Net -460 ml    Filed Weights   09/27/21 0500 09/28/21 0551 10/01/21 0500  Weight: (!) 208.2 kg (!) 209 kg (!) 248 kg    Examination: General: Adult female, acutely ill, lying in bed, NAD HEENT: MM pink/moist, anicteric, atraumatic, neck supple Neuro: A&O x 4, able to follow commands, PERRL +3, MAE CV: s1s2 RRR, ST on monitor, no r/m/g Pulm: Regular, non labored on  , breath sounds clear-BUL & diminished-BLL GI: soft, obese, non tender, bs x 4 GU: foley in place with clear yellow urine Skin: small blisters covering body, RUE red/swollen/painful   Extremities: warm/dry, pulses + 2 R/P, +2 edema noted RUE  Resolved Hospital Problem list     Assessment & Plan:   Sepsis MRSA bacteremia & RV failure  Present on admission  Patient asymptomatic, only able to obtain BP from ankle due to body habitus and skin integrity - agree with midodrine - peripheral vasopressors PRN to maintain MAP > 60 - continuous cardiac monitoring  Multifocal pneumonia Component Ref Range & Units 3 d ago 11 d ago 13 d ago  Enterococcus faecalis NOT DETECTED NOT DETECTED  NOT DETECTED  NOT DETECTED   Enterococcus Faecium NOT DETECTED NOT DETECTED  NOT DETECTED  NOT DETECTED   Listeria monocytogenes NOT DETECTED NOT DETECTED  NOT DETECTED  NOT DETECTED   Staphylococcus species NOT DETECTED DETECTED Abnormal   DETECTED Abnormal  CM  DETECTED Abnormal  CM   Comment: CRITICAL RESULT CALLED TO, READ BACK BY AND VERIFIED WITH:  ALEX CHAPPELL AT 1540 ON 09/28/21 BY SS   Staphylococcus aureus (BCID) NOT DETECTED NOT DETECTED  DETECTED Abnormal  CM  DETECTED Abnormal  CM   Staphylococcus epidermidis NOT DETECTED DETECTED Abnormal       RUE edema and blisters secondary to suspected allergic reaction to CHG wipes vs DVT  Per patient similar thing happened at [facility] after being exposed to those wipes - RUE [place]  to r/o DVT ordered, patient on heparin drip - solu-medrol 0.5 mg/kg IV ordered to assist with possible allergic rxn - benadryl PRN   Recurrent pulmonary emboli    Patient with severe anemia now  Best Practice (right click and "Reselect all SmartList Selections" daily)  Diet/type: Regular consistency (see orders) DVT prophylaxis: systemic heparin GI prophylaxis: PPI Lines: N/A Foley:  Yes, and it is still needed Code Status:  full code Last date of multidisciplinary goals of care discussion [per primary]  Labs   CBC: Recent Labs  Lab 09/25/21 0023 09/26/21 0352 09/28/21 0516 09/30/21 0202 10/01/21 0138  WBC 12.2* 10.2 8.7 9.5 7.9  NEUTROABS  --   --   --   --  5.2  HGB 7.6* 7.6* 7.7* 7.3* 8.0*  HCT 26.2* 26.2* 26.4* 25.1* 26.9*  MCV 75.9* 75.3* 74.8* 76.5* 75.8*  PLT 229 250 281 266 262     Basic Metabolic Panel: Recent Labs  Lab 09/25/21 0023 09/26/21 0352 09/28/21 0516 09/30/21 0202  NA 135 135 133* 135  K 3.8 4.0 3.8 3.8  CL 103 104 102 107  CO2 '26 26 24 23  '$ GLUCOSE 110* 100* 97 108*  BUN '18 13 10 9  '$ CREATININE 0.43* 0.50 0.39* 0.62  CALCIUM 8.1* 8.3* 8.5* 8.2*  MG 1.7 1.6* 1.8 1.7    GFR: Estimated Creatinine Clearance: 157.8 mL/min (by  C-G formula based on SCr of 0.62 mg/dL). Recent Labs  Lab 09/26/21 0352 09/28/21 0516 09/30/21 0202 10/01/21 0138  WBC 10.2 8.7 9.5 7.9     Liver Function Tests: No results for input(s): AST, ALT, ALKPHOS, BILITOT, PROT, ALBUMIN in the last 168 hours. No results for input(s): LIPASE, AMYLASE in the last 168 hours. No results for input(s): AMMONIA in the last 168 hours.  ABG No results found for: PHART, PCO2ART, PO2ART, HCO3, TCO2, ACIDBASEDEF, O2SAT   Coagulation Profile: No results for input(s): INR, PROTIME in the last 168 hours.   Cardiac Enzymes: No results for input(s): CKTOTAL, CKMB, CKMBINDEX, TROPONINI in the last 168 hours.  HbA1C: No results found for: HGBA1C  CBG: Recent Labs  Lab  09/24/21 2054  GLUCAP 117*     Review of Systems: positives in BOLD  Gen: Denies fever, chills, weight change, fatigue, night sweats HEENT: Denies blurred vision, double vision, hearing loss, tinnitus, sinus congestion, rhinorrhea, sore throat, neck stiffness, dysphagia PULM: Denies shortness of breath, cough, sputum production, hemoptysis, wheezing CV: Denies chest pain, edema, orthopnea, paroxysmal nocturnal dyspnea, palpitations GI: Denies abdominal pain, nausea, vomiting, diarrhea, hematochezia, melena, constipation, change in bowel habits GU: Denies dysuria, hematuria, polyuria, oliguria, urethral discharge Endocrine: Denies hot or cold intolerance, polyuria, polyphagia or appetite change Derm: Denies rash, swelling, pain, dry skin, scaling or peeling skin change Heme: Denies easy bruising, bleeding, bleeding gums Neuro: Denies headache, numbness, weakness, slurred speech, loss of memory or consciousness  Past Medical History:  She,  has a past medical history of Anemia, unspecified and Hypertension.   Surgical History:   Past Surgical History:  Procedure Laterality Date   DILATION AND CURETTAGE OF UTERUS     PERIPHERAL VASCULAR THROMBECTOMY Right 09/28/2021   Procedure: PERIPHERAL VASCULAR THROMBECTOMY;  Surgeon: Katha Cabal, MD;  Location: [place] [facility];  Service: Cardiovascular;  Laterality: Right;   THROMBECTOMY BRACHIAL ARTERY Right 09/28/2021   Procedure: THROMBECTOMY RIGHT UPPER EXTREMITY;  Surgeon: Katha Cabal, MD;  Location: [facility];  Service: Vascular;  Laterality: Right;     Social History:   reports that she has never smoked. She has never used smokeless tobacco. She reports that she does not drink alcohol and does not use drugs.   Family History:  Her family history includes Diabetes Mellitus I in her father; Hypertension in her father and mother.   Allergies Allergies  Allergen Reactions   Furosemide Rash    Per [facility] records - rash  possibly to IV furosemide but tablets ok   Chlorhexidine Hives, Dermatitis, Rash and Other (See Comments)    Blisters right after CHG wipe down     Home Medications  Prior to Admission medications   Medication Sig Start Date End Date Taking? Authorizing Provider  enoxaparin (LOVENOX) 120 MG/0.8ML injection Inject 120 mg into the skin daily. 09/01/21  Yes [provider]  ferrous sulfate 325 (65 FE) MG EC tablet Take 1 tablet (325 mg total) by mouth 3 (three) times daily with meals. 07/21/21 07/21/22 Yes Lorella Nimrod, MD  furosemide (LASIX) 40 MG tablet Take 40 mg by mouth daily. 07/31/21  Yes [provider]  magnesium oxide (MAG-OX) 400 (240 Mg) MG tablet Take 1 tablet by mouth daily. 06/17/21  Yes [provider]  medroxyPROGESTERone (PROVERA) 10 MG tablet Take 10 mg by mouth in the morning and at bedtime. 07/12/21  Yes [provider]  pantoprazole (PROTONIX) 40 MG tablet Take 40 mg by mouth daily. 09/01/21  Yes [provider]  torsemide (DEMADEX) 20 MG tablet Take 20 mg by mouth daily. 09/01/21  Yes [provider]  vitamin B-12 (CYANOCOBALAMIN) 1000 MCG tablet Take by mouth. 07/12/21  Yes [provider]      Ottie Glazier, M.D.  [facility]

## 2021-10-01 NOTE — Progress Notes (Addendum)
Per Blood Bank, due to blood shortage and hospital protocol, only able to transfuse 1 unit at a time. A lab draw should follow to evaluate H/H and determine need for additional units of blood. A new order would be required at that time.  1 of 2 ordered units of blood successfully transfused. No suspected reactions. Lab order placed for f/u CBC.  Addendum: hemoglobin 8.0, VSS. Notified Sharion Settler, NP. No new orders to transfuse.

## 2021-10-02 DIAGNOSIS — J189 Pneumonia, unspecified organism: Secondary | ICD-10-CM | POA: Diagnosis not present

## 2021-10-02 LAB — GLUCOSE, CAPILLARY: Glucose-Capillary: 95 mg/dL (ref 70–99)

## 2021-10-02 LAB — CULTURE, BLOOD (ROUTINE X 2)
Culture: NO GROWTH
Special Requests: ADEQUATE

## 2021-10-02 LAB — HEMOGLOBIN AND HEMATOCRIT, BLOOD
HCT: 27.5 % — ABNORMAL LOW (ref 36.0–46.0)
Hemoglobin: 8.1 g/dL — ABNORMAL LOW (ref 12.0–15.0)

## 2021-10-02 NOTE — Consult Note (Addendum)
[facility] Cardiology  CARDIOLOGY CONSULT NOTE  Patient ID: [name] MRN: [id number] DOB/AGE: 06/06/1960 61 y.o.  Admit date: 09/15/2021 Referring Physician Ar[facility]us Ped Primary Physician [facility] Primary Cardiologist Tiburcio Pea Reason for Consultation transesophageal echocardiogram  HPI: 61 year old female referred for evaluation of transesophageal echocardiogram.  Patient admitted with respiratory failure secondary to multifocal pneumonia and sepsis.  Patient bacteremic, with facility resistant Staph aureus.  Patient was seen by Dr. Steva Ready who recommends transesophageal echocardiogram to rule out valvular vegetations.  The patient has a history of pulmonary emboli, on Lovenox.  She also has a history of right upper extremity DVT, underwent thrombectomy 09/28/2021.  The patient is also anemic with hemoglobin hematocrit of 8.0 and 26.9, respectively, received 1 unit of PRBCs.  2D echocardiogram 09/20/2021 revealed normal left ventricular function, with LVEF 60-65%  Review of systems complete and found to be negative unless listed above     Past Medical History:  Diagnosis Date   Anemia, unspecified    Hypertension     Past Surgical History:  Procedure Laterality Date   DILATION AND CURETTAGE OF UTERUS     PERIPHERAL VASCULAR THROMBECTOMY Right 09/28/2021   Procedure: PERIPHERAL VASCULAR THROMBECTOMY;  Surgeon: Katha Cabal, MD;  Location: [facility];  Service: Cardiovascular;  Laterality: Right;   THROMBECTOMY BRACHIAL ARTERY Right 09/28/2021   Procedure: THROMBECTOMY RIGHT UPPER EXTREMITY;  Surgeon: Katha Cabal, MD;  Location: [facility];  Service: Vascular;  Laterality: Right;    Medications Prior to Admission  Medication Sig Dispense Refill Last Dose   enoxaparin (LOVENOX) 120 MG/0.8ML injection Inject 120 mg into the skin daily.   09/14/2021   ferrous sulfate 325 (65 FE) MG EC tablet Take 1 tablet (325 mg total) by mouth 3 (three) times daily with meals. 90 tablet 3  09/14/2021   furosemide (LASIX) 40 MG tablet Take 40 mg by mouth daily.   09/14/2021   magnesium oxide (MAG-OX) 400 (240 Mg) MG tablet Take 1 tablet by mouth daily.   09/14/2021   medroxyPROGESTERone (PROVERA) 10 MG tablet Take 10 mg by mouth in the morning and at bedtime.   09/14/2021   pantoprazole (PROTONIX) 40 MG tablet Take 40 mg by mouth daily.   09/14/2021   torsemide (DEMADEX) 20 MG tablet Take 20 mg by mouth daily.   09/14/2021   vitamin B-12 (CYANOCOBALAMIN) 1000 MCG tablet Take by mouth.   09/14/2021   Social History   Socioeconomic History   Marital status: Single    Spouse name: Not on file   Number of children: Not on file   Years of education: Not on file   Highest education level: Not on file  Occupational History   Not on file  Tobacco Use   Smoking status: Never   Smokeless tobacco: Never  Vaping Use   Vaping Use: Never used  Substance and Sexual Activity   Alcohol use: Never   Drug use: Never   Sexual activity: Never  Other Topics Concern   Not on file  Social History Narrative   Not on file   Social Determinants of Health   Financial Resource Strain: Not on file  Food Insecurity: Not on file  Transportation Needs: Not on file  Physical Activity: Not on file  Stress: Not on file  Social Connections: Not on file  Intimate Partner Violence: Not on file    Family History  Problem Relation Age of Onset   Hypertension Mother    Hypertension Father    Diabetes Mellitus  I Father       Review of systems complete and found to be negative unless listed above      PHYSICAL EXAM  General: Well developed, well nourished, in no acute distress HEENT:  Normocephalic and atramatic Neck:  No JVD.  Lungs: Clear bilaterally to auscultation and percussion. Heart: HRRR . Normal S1 and S2 without gallops or murmurs.  Abdomen: Bowel sounds are positive, abdomen soft and non-tender  Msk:  Back normal, normal gait. Normal strength and tone for age. Extremities: No  clubbing, cyanosis or edema.   Neuro: Alert and oriented X 3. Psych:  Good affect, responds appropriately  Labs:   Lab Results  Component Value Date   WBC 7.9 10/01/2021   HGB 8.1 (L) 10/02/2021   HCT 27.5 (L) 10/02/2021   MCV 75.8 (L) 10/01/2021   PLT 262 10/01/2021    Recent Labs  Lab 09/30/21 0202  NA 135  K 3.8  CL 107  CO2 23  BUN 9  CREATININE 0.62  CALCIUM 8.2*  GLUCOSE 108*   No results found for: CKTOTAL, CKMB, CKMBINDEX, TROPONINI No results found for: CHOL No results found for: HDL No results found for: LDLCALC No results found for: TRIG No results found for: CHOLHDL No results found for: LDLDIRECT    Radiology: CT Angio Chest PE W and/or Wo Contrast  Result Date: 09/17/2021 CLINICAL DATA:  Pulmonary embolism (PE) suspected, high prob. Hematuria, dyspnea EXAM: CT ANGIOGRAPHY CHEST WITH CONTRAST TECHNIQUE: Multidetector CT imaging of the chest was performed using the standard protocol during bolus administration of intravenous contrast. Multiplanar CT image reconstructions and MIPs were obtained to evaluate the vascular anatomy. RADIATION DOSE REDUCTION: This exam was performed according to the departmental dose-optimization program which includes automated exposure control, adjustment of the mA and/or kV according to patient size and/or use of iterative reconstruction technique. CONTRAST:  236m OMNIPAQUE IOHEXOL 350 MG/ML SOLN (due to requirement for second bolus) COMPARISON:  None Available. FINDINGS: Cardiovascular: Technically limited examination due to bolus timing, body habitus, and respiratory motion artifact. No intraluminal filling defect identified within the main, right, and left pulmonary arteries. Lobar, segmental, and subsegmental pulmonary arteries are not adequately opacified to definitively exclude the presence of acute pulmonary emboli. Central pulmonary arteries are enlarged in keeping with changes of pulmonary arterial hypertension. No significant  coronary artery calcification. Global cardiac size within normal limits. No pericardial effusion. Mild atherosclerotic calcification within the thoracic aorta. No aortic aneurysm. Mediastinum/Nodes: There is right axillary adenopathy identified as well as numerous enhancing lymph nodes within the right axilla demonstrating a matted appearance with perinodal infiltration likely related to inflammation or nodal metastatic disease. There is asymmetric edema within the right axilla noted. Right hilar adenopathy is present, possibly reactive in nature. Visualized thyroid is unremarkable. Esophagus is unremarkable. Lungs/Pleura: There is multifocal consolidation within the lungs bilaterally, in keeping with changes of acute multifocal pneumonia in the appropriate clinical setting. No pneumothorax or pleural effusion. Central airways are widely patent. Upper Abdomen: No acute abnormality. Musculoskeletal: No chest wall abnormality. No acute or significant osseous findings. Review of the MIP images confirms the above findings. IMPRESSION: Technically limited examination without evidence of large central pulmonary embolus. Lobar and peripheral pulmonary arterial vasculature is not adequately opacified on this examination to definitively exclude the presence of acute pulmonary embolus. Morphologic changes in keeping with pulmonary arterial hypertension. Multifocal pulmonary consolidation in keeping with multifocal pneumonic consolidation in the appropriate clinical setting. No central obstructing lesion. Probable reactive right hilar  adenopathy. Enhancing right axillary adenopathy demonstrating perinodal infiltration in keeping with acute infection, inflammation or neoplastic infiltration. Asymmetric right axillary edema. Correlation with clinical examination including right breast examination is recommended. Electronically Signed   By: Fidela Salisbury M.D.   On: 09/17/2021 04:34   PERIPHERAL VASCULAR  CATHETERIZATION  Result Date: 09/28/2021 See surgical note for result.  US Venous Img Upper Uni Right(DVT)  Result Date: 09/20/2021 CLINICAL DATA:  Right arm edema. EXAM: RIGHT UPPER EXTREMITY VENOUS DOPPLER ULTRASOUND TECHNIQUE: Gray-scale sonography with graded compression, as well as color Doppler and duplex ultrasound were performed to evaluate the upper extremity deep venous system from the level of the subclavian vein and including the jugular, axillary, basilic, radial, ulnar and upper cephalic vein. Spectral Doppler was utilized to evaluate flow at rest and with distal augmentation maneuvers. COMPARISON:  None Available. FINDINGS: Contralateral Subclavian Vein: Respiratory phasicity is normal and symmetric with the symptomatic side. No evidence of thrombus. Normal compressibility. Internal Jugular Vein: Occlusive thrombus. Subclavian Vein: Occlusive thrombus. Axillary Vein: Occlusive thrombus. Cephalic Vein: Proximal occlusive thrombus. Basilic Vein: Nearly occlusive thrombus with only trace residual flow evident. Brachial Veins: Occlusive thrombus. Radial Veins: Occlusive thrombus. Ulnar Veins: Diminutive but patent. IMPRESSION: 1. Extensive occlusive thrombus in the right upper extremity as above. These results will be called to the ordering clinician or representative by the Radiologist Assistant, and communication documented in the PACS or [employer]. Electronically Signed   By: Misty Stanley M.D.   On: 09/20/2021 07:18   US ARTERIAL LOWER EXTREMITY DUPLEX LEFT (NON-ABI)  Result Date: 09/19/2021 CLINICAL DATA:  Left lower extremity pain.  Evaluate for PAD. EXAM: LEFT LOWER EXTREMITY ARTERIAL DUPLEX SCAN TECHNIQUE: Gray-scale sonography as well as color Doppler and duplex ultrasound was performed to evaluate the lower extremity arteries including the common, superficial and profunda femoral arteries, popliteal artery and calf arteries. COMPARISON:  None Available. FINDINGS: Left lower  Extremity: Left Common femoral artery: Triphasic waveform; 201 cm/sec Normal waveform is demonstrated within the left common femoral artery however there is borderline elevated peak systolic velocity of 338 cm/sec, findings suggestive of potential inflow (aortoiliac disease). Left superficial femoral artery: Biphasic waveform Proximal: 109 cm/sec Mid: 118 cm/sec Distal: 81 cm/sec Left deep femoral artery: Biphasic waveform; 90 cm/sec Left popliteal artery: Biphasic waveform; 78 cm/sec Biphasic waveforms are demonstrated throughout the interrogated portions of the left superficial, and deep femoral arteries as well as the popliteal artery. Velocity differences between the mid and distal aspects of the left superficial femoral artery suggests hemodynamically significant narrowing regional to this location. Left anterior tibial artery: Monophasic waveform; 18 cm/sec Left posterior tibial artery: Biphasic waveform; 97 cm/sec Tibial runoff is patent the level of the lower leg however blunted monophasic waveform is demonstrated within the left anterior tibial artery with associated markedly reduced velocity suggestive of atretic flow. Biphasic waveform is demonstrated within the left posterior tibial artery. IMPRESSION: Suspected hemodynamically significant narrowings involving inflow, outflow and tibial runoff of the left lower extremity. Further evaluation with CTA run-off could be performed as indicated. Electronically Signed   By: Sandi Mariscal M.D.   On: 09/19/2021 10:36   DG Chest Port 1 View  Result Date: 09/29/2021 CLINICAL DATA:  Pneumonia. EXAM: PORTABLE CHEST 1 VIEW COMPARISON:  CTA chest 09/17/2021 FINDINGS: Cardiac silhouette is again mildly to moderately enlarged. Mediastinal contours within normal limits. Mild bilateral interstitial thickening. There patchy heterogeneous bilateral airspace opacities that are persistent but likely slightly improved from 09/17/2021 frontal scout view for prior chest  CT. No  pleural effusion or pneumothorax. IMPRESSION: 1. Stable mild cardiomegaly. 2. Bilateral patchy heterogeneous airspace opacities again suspicious for multifocal infection. These may be slightly improved from prior 09/17/2021 CT, however differences in modality limit comparison. Electronically Signed   By: Yvonne Kendall M.D.   On: 09/29/2021 09:05   DG C-Arm 1-60 Min-No Report  Result Date: 09/28/2021 Fluoroscopy was utilized by the requesting physician.  No radiographic interpretation.   ECHOCARDIOGRAM COMPLETE  Result Date: 09/21/2021    ECHOCARDIOGRAM REPORT   Patient Name:   Krystal Francis Date of Exam: 09/21/2021 Medical Rec #:  [id number]  Height:       64.0 in Accession #:    [id number] Weight:       459.0 lb Date of Birth:  09/09/1960 BSA:          2.785 m Patient Age:    61 years   BP:           126/57 mmHg Patient Gender: F          HR:           78 bpm. Exam Location:  [facility] Procedure: 2D Echo, Cardiac Doppler and Color Doppler Indications:     Endocarditis I38  History:         Patient has no prior history of Echocardiogram examinations.                  Risk Factors:Hypertension.  Sonographer:     Sherrie Sport Referring Phys:  [id number] Sharen Hones Diagnosing Phys: Kate Sable MD  Sonographer Comments: Technically challenging study due to limited acoustic windows, no apical window and no subcostal window. IMPRESSIONS  1. Left ventricular ejection fraction, by estimation, is 60 to 65%. The left ventricle has normal function. The left ventricle has no regional wall motion abnormalities. Left ventricular diastolic function could not be evaluated.  2. Right ventricular systolic function is normal. The right ventricular size is not well visualized.  3. The mitral valve is normal in structure. No evidence of mitral valve regurgitation.  4. The aortic valve is tricuspid. Aortic valve regurgitation is not visualized. FINDINGS  Left Ventricle: Left ventricular ejection fraction, by estimation, is 60 to 65%.  The left ventricle has normal function. The left ventricle has no regional wall motion abnormalities. The left ventricular internal cavity size was normal in size. There is  no left ventricular hypertrophy. Left ventricular diastolic function could not be evaluated. Right Ventricle: The right ventricular size is not well visualized. No increase in right ventricular wall thickness. Right ventricular systolic function is normal. Left Atrium: Left atrial size was normal in size. Right Atrium: Right atrial size was not well visualized. Pericardium: There is no evidence of pericardial effusion. Mitral Valve: The mitral valve is normal in structure. No evidence of mitral valve regurgitation. Tricuspid Valve: The tricuspid valve is normal in structure. Tricuspid valve regurgitation is mild. Aortic Valve: The aortic valve is tricuspid. Aortic valve regurgitation is not visualized. Pulmonic Valve: The pulmonic valve was not well visualized. Pulmonic valve regurgitation is not visualized. Aorta: The aortic root is normal in size and structure. Venous: The inferior vena cava was not well visualized. IAS/Shunts: The interatrial septum was not well visualized.  LEFT VENTRICLE PLAX 2D LVIDd:         4.90 cm LVIDs:         3.10 cm LV PW:         1.30 cm LV IVS:  0.70 cm  LEFT ATRIUM         Index LA diam:    4.20 cm 1.51 cm/m                        PULMONIC VALVE AORTA                 PV Vmax:        0.75 m/s Ao Root diam: 3.30 cm PV Vmean:       45.200 cm/s                       PV VTI:         0.138 m                       PV Peak grad:   2.3 mmHg                       PV Mean grad:   1.0 mmHg                       RVOT Peak grad: 4 mmHg  TRICUSPID VALVE TR Peak grad:   17.0 mmHg TR Vmax:        206.00 cm/s  SHUNTS Pulmonic VTI: 0.197 m Kate Sable MD Electronically signed by Kate Sable MD Signature Date/Time: 09/21/2021/5:09:53 PM    Final     EKG: Sinus tachycardia at 108 bpm  ASSESSMENT AND PLAN:    MRSA bacteremia on vancomycin Multifocal pneumonia/sepsis, clinically improved, oxygen saturation 95% on room air Pulmonary emboli on Lovenox Right upper extremity DVT, status post thrombectomy, on Lovenox Anemia, hemoglobin adequate 8.0 and 26.9  Recommendations  1.  Agree with overall current therapy 2.  Proceed with transesophageal echocardiogram to rule out valvular vegetations.  I discussed the risk, benefits alternatives of TEE and the patient agrees to proceed. Tentatively scheduled for tomorrow.  Signed: Isaias Cowman MD,PhD, [facility] 10/02/2021, 2:13 PM

## 2021-10-02 NOTE — Progress Notes (Signed)
NAME:  Krystal Francis, MRN:  [id number], DOB:  1960-09-16, LOS: 16 ADMISSION DATE:  09/15/2021, CONSULTATION DATE:  09/20/21 REFERRING MD:  Morton Amy, NP, CHIEF COMPLAINT:  Shortness of breath  History of Present Illness:  61 year old female presenting to [facility] ED on 09/15/2021 from home via EMS with complaints of hematuria and her chronic Foley catheter over the last couple of days.  ED course: 09/15/2021: patient was worked up for possible UTI, vaginal bleeding and/or Foley catheter obstruction or malfunction.  Foley catheter was replaced with immediate return of clear yellow urine and suspicion that Foley balloon may have irritated urethra causing some local bleeding.  Pelvic exam revealed no vaginal bleeding.  Patient was stabilized for discharge however she states she was unable to go home.  She is unable to get in and out of bed & complete ADLs.  EDP placed TOC consult as well as PT and OT consults. 09/17/2021: EDP called bedside after patient has been boarding for 35 hours due to complaints of shortness of breath with associated tachycardia.  Patient had not received any outpatient medications including her Lasix or Lovenox.  CT angio performed to rule out pulmonary embolus, limited due to body habitus but does not show any large PEs.  CT chest however concerning for multifocal pneumonia with lab work revealing leukocytosis.  Sepsis work-up completed with cefepime and vancomycin and 1 L IV fluid bolus.  TRH consulted for admission.   09/30/21- patient reports freezing despite having external bair hugger warming system.  She has had constipation with abdominal discomfort.  We discussed severe symptomatic anemia and she is interested in having blood transfusion. Due to severe symptomatic anemia and possible ongoing blood loss will order Prbc transfusion.   10/01/21- patient is improved.  She is no longer feeling cold post 1 unit prbc.  She had bath this am. We can dc 2nd unit since theres shortage and patient  is feeling better. From pulmonary standpoint shes improving.  10/02/21- patient is improved, h/h is improving.  She is not coughing and she shares respiratory status is good.  PCCM will sign off at this time and ill be available if needed.    CT angio chest 09/17/21 : Technically limited examination without evidence of large central pulmonary embolus. Lobar and peripheral pulmonary arterial vasculature is not adequately opacified on this examination to definitively exclude the presence of acute pulmonary embolus. Morphologic changes in keeping with pulmonary arterial hypertension. Multifocal pulmonary consolidation in keeping with multifocal pneumonic consolidation in the appropriate clinical setting. No central obstructing lesion. Probable reactive right hilar adenopathy.  Enhancing right axillary adenopathy demonstrating perinodal infiltration in keeping with acute infection, inflammation or neoplastic infiltration. Asymmetric right axillary edema. Correlation with clinical examination including right breast examination is recommended.   US arterial left lower extremity 09/18/21: Suspected hemodynamically significant narrowings involving inflow, outflow and tibial runoff of the left lower extremity. Further evaluation with CTA run-off could be performed as indicated.  PCCM consulted for additional evaluation with prolonged hospitalization having multifocal pneumonia    Pertinent  Medical History  RV failure HTN GERD PE on lovenox Iron deficiency anemia Urinary retention with chronic foley catheter Uterine bleeding Morbid obesity BMI 78.27 HTN  Significant Hospital Events: Including procedures, antibiotic start and stop dates in addition to other pertinent events   09/17/21: admit to inpatient with sepsis s/t suspected HCAP. ID consulted, BCx + for MRSA 09/19/21: vascular consulted for severe LLE PVD 09/20/21: overnight transfer to ICU/SDU for potential vasopressors  Objective    Blood pressure 122/60, pulse (!) 102, temperature 98.2 F (36.8 C), temperature source Oral, resp. rate 18, height '5\' 4"'$  (1.626 m), weight (!) 154 kg, SpO2 95 %.        Intake/Output Summary (Last 24 hours) at 10/02/2021 1014 Last data filed at 10/02/2021 0655 Gross per 24 hour  Intake 400 ml  Output 1900 ml  Net -1500 ml    Filed Weights   09/28/21 0551 10/01/21 0500 10/02/21 0500  Weight: (!) 209 kg (!) 248 kg (!) 154 kg    Examination: General: Adult female, acutely ill, lying in bed, NAD HEENT: MM pink/moist, anicteric, atraumatic, neck supple Neuro: A&O x 4, able to follow commands, PERRL +3, MAE CV: s1s2 RRR, ST on monitor, no r/m/g Pulm: Regular, non labored on  , breath sounds clear-BUL & diminished-BLL GI: soft, obese, non tender, bs x 4 GU: foley in place with clear yellow urine Skin: small blisters covering body, RUE red/swollen/painful   Extremities: warm/dry, pulses + 2 R/P, +2 edema noted RUE  Resolved Hospital Problem list     Assessment & Plan:   Sepsis MRSA bacteremia & RV failure  Present on admission  Patient asymptomatic, only able to obtain BP from ankle due to body habitus and skin integrity - agree with midodrine - peripheral vasopressors PRN to maintain MAP > 60 - continuous cardiac monitoring  Multifocal pneumonia Component Ref Range & Units 3 d ago 11 d ago 13 d ago  Enterococcus faecalis NOT DETECTED NOT DETECTED  NOT DETECTED  NOT DETECTED   Enterococcus Faecium NOT DETECTED NOT DETECTED  NOT DETECTED  NOT DETECTED   Listeria monocytogenes NOT DETECTED NOT DETECTED  NOT DETECTED  NOT DETECTED   Staphylococcus species NOT DETECTED DETECTED Abnormal   DETECTED Abnormal  CM  DETECTED Abnormal  CM   Comment: CRITICAL RESULT CALLED TO, READ BACK BY AND VERIFIED WITH:  ALEX CHAPPELL AT 1540 ON 09/28/21 BY SS   Staphylococcus aureus (BCID) NOT DETECTED NOT DETECTED  DETECTED Abnormal  CM  DETECTED Abnormal  CM   Staphylococcus epidermidis NOT  DETECTED DETECTED Abnormal       RUE edema and blisters secondary to suspected allergic reaction to CHG wipes vs DVT  Per patient similar thing happened at [facility] after being exposed to those wipes - RUE [place] to r/o DVT ordered, patient on heparin drip - solu-medrol 0.5 mg/kg IV ordered to assist with possible allergic rxn - benadryl PRN   Recurrent pulmonary emboli    Patient with severe anemia now  Best Practice (right click and "Reselect all SmartList Selections" daily)  Diet/type: Regular consistency (see orders) DVT prophylaxis: systemic heparin GI prophylaxis: PPI Lines: N/A Foley:  Yes, and it is still needed Code Status:  full code Last date of multidisciplinary goals of care discussion [per primary]  Labs   CBC: Recent Labs  Lab 09/26/21 0352 09/28/21 0516 09/30/21 0202 10/01/21 0138  WBC 10.2 8.7 9.5 7.9  NEUTROABS  --   --   --  5.2  HGB 7.6* 7.7* 7.3* 8.0*  HCT 26.2* 26.4* 25.1* 26.9*  MCV 75.3* 74.8* 76.5* 75.8*  PLT 250 281 266 262     Basic Metabolic Panel: Recent Labs  Lab 09/26/21 0352 09/28/21 0516 09/30/21 0202  NA 135 133* 135  K 4.0 3.8 3.8  CL 104 102 107  CO2 '26 24 23  '$ GLUCOSE 100* 97 108*  BUN '13 10 9  '$ CREATININE 0.50 0.39* 0.62  CALCIUM  8.3* 8.5* 8.2*  MG 1.6* 1.8 1.7    GFR: Estimated Creatinine Clearance: 112.8 mL/min (by C-G formula based on SCr of 0.62 mg/dL). Recent Labs  Lab 09/26/21 0352 09/28/21 0516 09/30/21 0202 10/01/21 0138  WBC 10.2 8.7 9.5 7.9     Liver Function Tests: No results for input(s): AST, ALT, ALKPHOS, BILITOT, PROT, ALBUMIN in the last 168 hours. No results for input(s): LIPASE, AMYLASE in the last 168 hours. No results for input(s): AMMONIA in the last 168 hours.  ABG No results found for: PHART, PCO2ART, PO2ART, HCO3, TCO2, ACIDBASEDEF, O2SAT   Coagulation Profile: No results for input(s): INR, PROTIME in the last 168 hours.   Cardiac Enzymes: No results for input(s): CKTOTAL, CKMB,  CKMBINDEX, TROPONINI in the last 168 hours.  HbA1C: No results found for: HGBA1C  CBG: No results for input(s): GLUCAP in the last 168 hours.   Review of Systems: positives in BOLD  Gen: Denies fever, chills, weight change, fatigue, night sweats HEENT: Denies blurred vision, double vision, hearing loss, tinnitus, sinus congestion, rhinorrhea, sore throat, neck stiffness, dysphagia PULM: Denies shortness of breath, cough, sputum production, hemoptysis, wheezing CV: Denies chest pain, edema, orthopnea, paroxysmal nocturnal dyspnea, palpitations GI: Denies abdominal pain, nausea, vomiting, diarrhea, hematochezia, melena, constipation, change in bowel habits GU: Denies dysuria, hematuria, polyuria, oliguria, urethral discharge Endocrine: Denies hot or cold intolerance, polyuria, polyphagia or appetite change Derm: Denies rash, swelling, pain, dry skin, scaling or peeling skin change Heme: Denies easy bruising, bleeding, bleeding gums Neuro: Denies headache, numbness, weakness, slurred speech, loss of memory or consciousness  Past Medical History:  She,  has a past medical history of Anemia, unspecified and Hypertension.   Surgical History:   Past Surgical History:  Procedure Laterality Date   DILATION AND CURETTAGE OF UTERUS     PERIPHERAL VASCULAR THROMBECTOMY Right 09/28/2021   Procedure: PERIPHERAL VASCULAR THROMBECTOMY;  Surgeon: Katha Cabal, MD;  Location: [place] [facility];  Service: Cardiovascular;  Laterality: Right;   THROMBECTOMY BRACHIAL ARTERY Right 09/28/2021   Procedure: THROMBECTOMY RIGHT UPPER EXTREMITY;  Surgeon: Katha Cabal, MD;  Location: [facility];  Service: Vascular;  Laterality: Right;     Social History:   reports that she has never smoked. She has never used smokeless tobacco. She reports that she does not drink alcohol and does not use drugs.   Family History:  Her family history includes Diabetes Mellitus I in her father; Hypertension in her  father and mother.   Allergies Allergies  Allergen Reactions   Furosemide Rash    Per [facility] records - rash possibly to IV furosemide but tablets ok   Chlorhexidine Hives, Dermatitis, Rash and Other (See Comments)    Blisters right after CHG wipe down     Home Medications  Prior to Admission medications   Medication Sig Start Date End Date Taking? Authorizing Provider  enoxaparin (LOVENOX) 120 MG/0.8ML injection Inject 120 mg into the skin daily. 09/01/21  Yes [provider]  ferrous sulfate 325 (65 FE) MG EC tablet Take 1 tablet (325 mg total) by mouth 3 (three) times daily with meals. 07/21/21 07/21/22 Yes Lorella Nimrod, MD  furosemide (LASIX) 40 MG tablet Take 40 mg by mouth daily. 07/31/21  Yes [provider]  magnesium oxide (MAG-OX) 400 (240 Mg) MG tablet Take 1 tablet by mouth daily. 06/17/21  Yes [provider]  medroxyPROGESTERone (PROVERA) 10 MG tablet Take 10 mg by mouth in the morning and at bedtime. 07/12/21  Yes  [provider]  pantoprazole (PROTONIX) 40 MG tablet Take 40 mg by mouth daily. 09/01/21  Yes [provider]  torsemide (DEMADEX) 20 MG tablet Take 20 mg by mouth daily. 09/01/21  Yes [provider]  vitamin B-12 (CYANOCOBALAMIN) 1000 MCG tablet Take by mouth. 07/12/21  Yes [provider]      Ottie Glazier, M.D.  Pulmonary & [place]

## 2021-10-02 NOTE — Progress Notes (Signed)
Progress Note   Patient: Krystal Francis GQQ:[id number] DOB: 1960-05-20 DOA: 09/15/2021     15 DOS: the patient was seen and examined on 10/02/2021   Brief hospital course: Krystal Francis is a 61 y.o. female with medical history significant of RV failure, hypertension, GERD, PE on Lovenox, iron deficiency anemia, urinary retention with Foley catheter placement, uterine bleeding, morbid obesity with BMI 78.27, who presents with shortness of breath. Patient had a significant tachycardia, tachypnea, leukocytosis and hypoxemia with oxygen saturation 87%, she met severe sepsis criteria. Her blood culture positive for MRSA, CT angiogram showed multifocal pneumonia with evidence of pulmonary hypertension. She was seen by ID, vancomycin was started.  Assessment and Plan:  Severe sepsis secondary to multifocal pneumonia. Multifocal pneumonia secondary to MRSA. MRSA septicemia. Acute hypoxemic respiratory failure secondary to pneumonia. Ruled in, POA Repeated blood culture drawn on 5/22 and 5/25 still positive for staph.   2nd repeat Bcx on 5/30 negative to date (ex contaminant MRSE in 1 of 4 bottles) TTE negative for thrombus --ID consulted --Continue IV vancomycin (will need minimum 6 weeks) --Follow repeat cultures to final --Follow RUE thrombus cultures --Cardiology consulted for TEE -they felt patient was quite high risk, wanted to wait until closer to discharge and reassess.  I reached back out to cardiology today, awaiting response   Hypotension -improved Partially due to severe right-sided congestive heart failure.   Treated with albumin, midodrine.  Briefly treated in ICU, was started on IV solumedrol.   Blood pressure better, transferred back to PCU on 5/23.   Solumedrol d/c'ed. --monitor BP --maintain MAP>65  Recent pulmonary emboli On full dose Lovenox PTA, was switched to heparin gtt due to concern of hematuria, this resolved. --cont full dose Lovenox   Right arm swelling 2/2 RUE  DVT --pt with severe edema of RUE, and found to have extensive occlusive thrombus  --cont home Lovenox --Vascular consulted  --S/P thrombectomy 5/31 due to concern of MRSA seeding from the thrombus  -- Thrombus sent for culture  Acute blood loss anemia with iron deficient anemia. Gross hematuria secondary to indwelling Foley catheter. Patient hemoglobin has been low, but no additional hematuria. Due to severe right-sided congestive heart failure, pt received 1 unit of PRBC. 6/2: Hbg trending down 7.7>>7.3 today.  Pulmonology ordered 1 unit PRBCs transfusion 6/3: Hemoglobin 8.0 /4: Hemoglobin 8.1 stable Plan: --cont iron supplement --Monitor Hgb, transfuse if < 7.0   Hypomagnesemia.  Resolved w replacement Hypokalemia.  Resolved w replacement --monitor and replete PRN   Hyponatremia -resolved. Monitor BMP.  Morbid obesity with BMI 78.27. Significantly complicates overall care and prognosis.  Recommend lifestyle modifications including physical activity and diet for weight loss and overall [place]-term health.    Chronic right-sided congestive heart failure. Assess daily for diuretic needs. Volume status difficult to assess due to body habitus, no distal lower extremity edema   Left leg severe peripheral arterial disease. Patient has leg pain at rest, duplex ultrasound showed suspected significant peripheral vascular disease.  Patient was seen by vascular surgery, no intervention planned due to bacteremia.   --tramadol PRN --No need for IV opioids   Contact dermatitis  Was wiped down with CHG wipes per Infectious disease protocol and developed blistering to right arm and elbow on both upper arm and forearm.  Improved. --wound care per order   Foley cath, POA, removed --Pt presented with Foley cath.  Per pt, Foley cath was inserted for keeping her skin dry, not for retention.  Removed on 5/25.  Pure  wick.         Subjective: Patient awake resting in bed when seen on rounds  today.  Reports feeling overall well.  She denies having any acute complaints including fevers chills, shortness of breath chest pain, nausea vomiting.  There is blood in the PureWick tubing today, patient reports this due to her intermittent vaginal bleeding.  Denies dizziness or lightheadedness.  She continues to decline SNF and reports that she knows she can do fine at home on her own.  Physical Exam: Vitals:   10/01/21 1528 10/01/21 2103 10/02/21 0500 10/02/21 0837  BP: 134/71 (!) 117/59  122/60  Pulse: 82 81  (!) 102  Resp: 18 20  18   Temp: 98.3 F (36.8 C) 98.3 F (36.8 C)  98.2 F (36.8 C)  TempSrc: Oral Oral  Oral  SpO2: 94% 96%  95%  Weight:   (!) 154 kg   Height:         Data Reviewed:  Notable labs: Hemoglobin 8.1 (stable since transfusion 6/2)    Family Communication: Updated Sister Bonnita Nasuti by phone afternoon 6/2  Disposition: Status is: Inpatient Remains inpatient appropriate because: Ongoing evaluation not appropriate for the outpatient setting, needs TEE, on IV antibiotics with cultures pending.    SNF recommended for rehab, patient declining, wants to return home with home health but needs hospital bed and other DME   Planned Discharge Destination: Skilled nursing facility    Time spent: 35 minutes  Author: Ezekiel Slocumb, DO 10/02/2021 2:59 PM  For on call review [email or web address].

## 2021-10-03 DIAGNOSIS — I82A11 Acute embolism and thrombosis of right axillary vein: Secondary | ICD-10-CM | POA: Diagnosis not present

## 2021-10-03 DIAGNOSIS — J189 Pneumonia, unspecified organism: Secondary | ICD-10-CM | POA: Diagnosis not present

## 2021-10-03 DIAGNOSIS — N950 Postmenopausal bleeding: Secondary | ICD-10-CM | POA: Diagnosis present

## 2021-10-03 DIAGNOSIS — R7881 Bacteremia: Secondary | ICD-10-CM | POA: Diagnosis not present

## 2021-10-03 DIAGNOSIS — B9562 Methicillin resistant Staphylococcus aureus infection as the cause of diseases classified elsewhere: Secondary | ICD-10-CM | POA: Diagnosis not present

## 2021-10-03 DIAGNOSIS — Z7901 Long term (current) use of anticoagulants: Secondary | ICD-10-CM | POA: Diagnosis not present

## 2021-10-03 LAB — CBC
HCT: 28.9 % — ABNORMAL LOW (ref 36.0–46.0)
Hemoglobin: 8.5 g/dL — ABNORMAL LOW (ref 12.0–15.0)
MCH: 22.7 pg — ABNORMAL LOW (ref 26.0–34.0)
MCHC: 29.4 g/dL — ABNORMAL LOW (ref 30.0–36.0)
MCV: 77.1 fL — ABNORMAL LOW (ref 80.0–100.0)
Platelets: 277 10*3/uL (ref 150–400)
RBC: 3.75 MIL/uL — ABNORMAL LOW (ref 3.87–5.11)
RDW: 25.2 % — ABNORMAL HIGH (ref 11.5–15.5)
WBC: 7.6 10*3/uL (ref 4.0–10.5)
nRBC: 0 % (ref 0.0–0.2)

## 2021-10-03 LAB — BASIC METABOLIC PANEL
Anion gap: 5 (ref 5–15)
BUN: 9 mg/dL (ref 6–20)
CO2: 25 mmol/L (ref 22–32)
Calcium: 8.5 mg/dL — ABNORMAL LOW (ref 8.9–10.3)
Chloride: 108 mmol/L (ref 98–111)
Creatinine, Ser: 0.39 mg/dL — ABNORMAL LOW (ref 0.44–1.00)
GFR, Estimated: >60 mL/min (ref >60)
Glucose, Bld: 95 mg/dL (ref 70–99)
Potassium: 3.9 mmol/L (ref 3.5–5.1)
Sodium: 138 mmol/L (ref 135–145)

## 2021-10-03 LAB — AEROBIC/ANAEROBIC CULTURE W GRAM STAIN (SURGICAL/DEEP WOUND)
Culture: NO GROWTH
Gram Stain: NONE SEEN

## 2021-10-03 LAB — MAGNESIUM: Magnesium: 1.5 mg/dL — ABNORMAL LOW (ref 1.7–2.4)

## 2021-10-03 MED ORDER — MAGNESIUM SULFATE 2 GM/50ML IV SOLN
2.0000 g | Freq: Once | INTRAVENOUS | Status: AC
  Administered 2021-10-03: 2 g via INTRAVENOUS
  Filled 2021-10-03: qty 50

## 2021-10-03 MED ORDER — SODIUM CHLORIDE 0.9 % IV SOLN: INTRAVENOUS | Status: DC

## 2021-10-03 NOTE — Assessment & Plan Note (Addendum)
Ongoing for many months. Seen here in March and transferred to [facility] where she underwent hysteroscopy, vaginoscopy and endometrial biopsy.  I am not able to find biopsy results in Care Everywhere.  Pt reports being told it was negative. She was noted to have a submucosal fibroid, bleeding has been attributed to that.  Also has thickened endometrium.  On Provera BID --> increased to TID  F/U with Gyn at [facility] as scheduled  previous hospitalist per report has spoken w/ OBGYN and was adivsed can increase provera to tid (done) but any surgical intervention would need to be done at tertiary care center d/t weight   Needing permanent anticoagulation d/t recent DVT and Hx PE

## 2021-10-03 NOTE — Progress Notes (Signed)
Progress Note   Patient: Krystal Francis WIO:[id number] DOB: 1960/10/16 DOA: 09/15/2021     16 DOS: the patient was seen and examined on 10/03/2021   Brief hospital course: Krystal Francis is a 61 y.o. female with medical history significant of RV failure, hypertension, GERD, PE on Lovenox, iron deficiency anemia, urinary retention with Foley catheter placement, uterine bleeding, morbid obesity with BMI 78.27, who presents with shortness of breath. Patient had a significant tachycardia, tachypnea, leukocytosis and hypoxemia with oxygen saturation 87%, she met severe sepsis criteria. Her blood culture positive for MRSA, CT angiogram showed multifocal pneumonia with evidence of pulmonary hypertension. She was seen by ID, vancomycin was started.  Assessment and Plan:  Severe sepsis secondary to multifocal pneumonia. Multifocal pneumonia secondary to MRSA. MRSA septicemia. Acute hypoxemic respiratory failure secondary to pneumonia. Ruled in, POA Repeated blood culture drawn on 5/22 and 5/25 still positive for staph.   2nd repeat Bcx on 5/30 negative to date (ex contaminant MRSE in 1 of 4 bottles) TTE negative for thrombus --ID consulted --Continue IV vancomycin (will need minimum 6 weeks) --Follow repeat cultures to final --Follow RUE thrombus cultures --Cardiology consulted for TEE -they felt patient was quite high risk, wanted to wait until closer to discharge and reassess.  I reached back out to cardiology, plan for TEE tomorrow.   Hypotension -improved Partially due to severe right-sided congestive heart failure.   Treated with albumin, midodrine.  Briefly treated in ICU, was started on IV solumedrol.   Blood pressure better, transferred back to PCU on 5/23.   Solumedrol d/c'ed. --monitor BP --maintain MAP>65  Recent pulmonary emboli On full dose Lovenox PTA, was switched to heparin gtt due to concern of hematuria, this resolved. --cont full dose Lovenox   Right arm swelling 2/2 RUE  DVT --pt with severe edema of RUE, and found to have extensive occlusive thrombus  --cont home Lovenox --Vascular consulted  --S/P thrombectomy 5/31 due to concern of MRSA seeding from the thrombus  -- Thrombus sent for culture  Acute blood loss anemia with iron deficient anemia. Gross hematuria secondary to indwelling Foley catheter. Patient hemoglobin has been low, but no additional hematuria. Due to severe right-sided congestive heart failure, pt received 1 unit of PRBC. 6/2: Hbg trending down 7.7>>7.3 today.  Pulmonology ordered 1 unit PRBCs transfusion 6/3: Hemoglobin 8.0 /4: Hemoglobin 8.1 stable Plan: --cont iron supplement --Monitor Hgb, transfuse if < 7.0   Hypomagnesemia.  Resolved w replacement Hypokalemia.  Resolved w replacement --monitor and replete PRN   Hyponatremia -resolved. Monitor BMP.  Morbid obesity with BMI 78.27. Significantly complicates overall care and prognosis.  Recommend lifestyle modifications including physical activity and diet for weight loss and overall [name].    Chronic right-sided congestive heart failure. Assess daily for diuretic needs. Volume status difficult to assess due to body habitus, no distal lower extremity edema   Left leg severe peripheral arterial disease. Patient has leg pain at rest, duplex ultrasound showed suspected significant peripheral vascular disease.  Patient was seen by vascular surgery, no intervention planned due to bacteremia.   --tramadol PRN --No need for IV opioids   Contact dermatitis  Was wiped down with CHG wipes per Infectious disease protocol and developed blistering to right arm and elbow on both upper arm and forearm.  Improved. --wound care per order   Foley cath, POA, removed --Pt presented with Foley cath.  Per pt, Foley cath was inserted for keeping her skin dry, not for retention.  Removed on 5/25.  Pure wick.   Postmenopausal bleeding In March had secondary severe anemia requiring  admission and transfusions. Ongoing for many months. Seen here in March and transferred to [facility] where she underwent hysteroscopy, vaginoscopy and endometrial biopsy.  I am not able to find biopsy results in Care Everywhere.  Pt reports being told it was negative. She was noted to have a submucosal fibroid, bleeding has been attributed to that.  Also has thickened endometrium. --On Provera BID --F/U with Gyn at [facility] as scheduled --Confirm EMB results      Subjective: Patient awake resting in bed when seen on rounds today. She reprots feeling okay. Asks for ACE bandage to be removed from her arm due to itching.  She wishes we could have gynecology do something to stop her vaginal bleeding.  It comes and goes.  She continues to say she'll do fine managing at home as [employer] as she has the right bed she can get in/out of.  No other acute complaints.  TEE planned for tomorrow.   Physical Exam: Vitals:   10/03/21 0518 10/03/21 0519 10/03/21 0542 10/03/21 0910  BP: (!) 98/55 (!) 97/53  100/71  Pulse: (!) 102 (!) 101 99 100  Resp:      Temp:  98.3 F (36.8 C)  98.9 F (37.2 C)  TempSrc:    Oral  SpO2: 94% 94%  95%  Weight:      Height:         Data Reviewed:  Notable labs: Hemoglobin 8.5 (stable improving since transfusion 6/2)    Family Communication: Updated Sister Bonnita Nasuti by phone afternoon 6/2  Disposition: Status is: Inpatient Remains inpatient appropriate because: Ongoing evaluation not appropriate for the outpatient setting, TEE planned for tomorrow, on IV antibiotics with definitive therapy and duration TBD pending.  SNF recommended for rehab, patient declining, wants to return home with home health but needs hospital bed and other DME   Planned Discharge Destination: Home with [facility]      Time spent: 35 minutes  Author: Ezekiel Slocumb, DO 10/03/2021 4:34 PM  For on call review [email or web address].

## 2021-10-03 NOTE — Assessment & Plan Note (Signed)
Mg 1.5 on 6/5, given 2 g IV Mg-sulfate.  Monitor & replace Mg PRN

## 2021-10-03 NOTE — Progress Notes (Signed)
Date of Admission:  09/15/2021    ID: Krystal Francis is a 61 y.o. female  Principal Problem:   Multifocal pneumonia Active Problems:   Morbid obesity with body mass index of 70 and over in adult [facility])   HTN (hypertension)   Severe sepsis (HCC)   Hematuria   Iron deficiency anemia   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[place])   Pulmonary embolism (HCC)   Urinary retention   MRSA bacteremia   Hypomagnesemia   Acute hypoxemic respiratory failure (HCC)   Arterial hypotension   Palliative care by specialist   Goals of care, counseling/discussion    Subjective: Pt is feeling okay Swelling rt arm better Dry skin she says Cannot get oput of bed as bed does not go low  Says she cannot do IV antibiotics at home  Medications:   enoxaparin (LOVENOX) injection  120 mg Subcutaneous Q12H   feeding supplement  237 mL Oral BID BM   iron polysaccharides  150 mg Oral Daily   magnesium oxide  400 mg Oral Daily   medroxyPROGESTERone  10 mg Oral BID   multivitamin with minerals  1 tablet Oral Daily   pantoprazole  40 mg Oral Daily   senna-docusate  2 tablet Oral BID   silver sulfADIAZINE   Topical BID    Objective: Vital signs in last 24 hours: Temp:  [98.1 F (36.7 C)-98.9 F (37.2 C)] 98.9 F (37.2 C) (06/05 0910) Pulse Rate:  [84-102] 102 (06/05 0910) Resp:  [17-18] 18 (06/04 2102) BP: (97-122)/(53-71) 100/71 (06/05 0910) SpO2:  [94 %-97 %] 95 % (06/05 0910)   PHYSICAL EXAM:  General: Alert, cooperative, no distress BMI 58 Lungs: b/l air entry- few basal crepts Heart: Regular rate and rhythm, no murmur, rub or gallop. Abdomen: Soft, non-tender,not distended. Bowel sounds normal. No masses Extremities: rt arm edema much better Scaling, scabs of forearm Lymphedema legs  Left > rt Skin Peeling skin    Neurologic: Grossly non-focal  Lab Results Recent Labs    10/01/21 0138 10/02/21 1300 10/03/21 0406  WBC 7.9  --  7.6  HGB 8.0* 8.1* 8.5*  HCT 26.9* 27.5* 28.9*  NA  --   --  138   K  --   --  3.9  CL  --   --  108  CO2  --   --  25  BUN  --   --  9  CREATININE  --   --  0.39*    Microbiology: 09/17/21 4/4 MRSA 09/19/21- MRSA bacteremia 2/4 blood culture 09/22/21 MRSA 09/27/2021: Blood cultures 1 of 4  staph epidermidis MRSE ( this is not MRSA- this isa skin contaminant)    Assessment/Plan:  MRSA bacteremia with b/l infiltrates in the lung likely from MRSA and concern for endocarditis TEE was not done because of her resp status will need total of 6 weeks of IV vancomycin until 11/03/21 Pt says she cannot do IV at home and I agree she is not mobile enough to do that  Rt upper extremity DVT- underwent thrombectomy as there was concern for seeding because ofperssitent bacteremia Culture of thrombus neg  MRSE 1 of 4- skin contaminant  Allergic skin reaction to CHG  Recent PE with rt ventricle strain when she was at [facility]  Anemia with h/o vaginal bleed - on provera  Needed multiple transfusion in the past at [facility]  Lymphedema legs  She will need to go to SNF for IV antibiotic Discussed the management with the careteam

## 2021-10-04 ENCOUNTER — Encounter: Payer: Self-pay | Admitting: Registered Nurse

## 2021-10-04 ENCOUNTER — Encounter
Admission: EM | Disposition: A | Discharge status: Home Health | Payer: Self-pay | Source: Home / Self Care | Attending: Osteopathic Medicine

## 2021-10-04 ENCOUNTER — Inpatient Hospital Stay
Admit: 2021-10-04 | Discharge: 2021-10-04 | Disposition: A | Discharge status: Home / Self Care | Payer: Medicare Other | Attending: Internal Medicine | Admitting: Internal Medicine

## 2021-10-04 DIAGNOSIS — J189 Pneumonia, unspecified organism: Secondary | ICD-10-CM | POA: Diagnosis not present

## 2021-10-04 HISTORY — PX: TEE WITHOUT CARDIOVERSION: SHX5443

## 2021-10-04 SURGERY — ECHOCARDIOGRAM, TRANSESOPHAGEAL
Anesthesia: Moderate Sedation

## 2021-10-04 MED ORDER — BUTAMBEN-TETRACAINE-BENZOCAINE 2-2-14 % EX AERO
INHALATION_SPRAY | CUTANEOUS | Status: AC
  Filled 2021-10-04: qty 5

## 2021-10-04 MED ORDER — MIDAZOLAM HCL 2 MG/2ML IJ SOLN
INTRAMUSCULAR | Status: AC
  Filled 2021-10-04: qty 4

## 2021-10-04 MED ORDER — FENTANYL CITRATE (PF) 100 MCG/2ML IJ SOLN
INTRAMUSCULAR | Status: AC | PRN
  Administered 2021-10-04: 50 ug via INTRAVENOUS

## 2021-10-04 MED ORDER — LIDOCAINE VISCOUS HCL 2 % MT SOLN
OROMUCOSAL | Status: AC
  Filled 2021-10-04: qty 15

## 2021-10-04 MED ORDER — LIDOCAINE VISCOUS HCL 2 % MT SOLN
OROMUCOSAL | Status: AC | PRN
  Administered 2021-10-04: 15 mL via OROMUCOSAL

## 2021-10-04 MED ORDER — MEDROXYPROGESTERONE ACETATE 10 MG PO TABS
10.0000 mg | ORAL_TABLET | Freq: Three times a day (TID) | ORAL | Status: DC
  Administered 2021-10-04 – 2021-10-13 (×27): 10 mg via ORAL
  Filled 2021-10-04 (×27): qty 1

## 2021-10-04 MED ORDER — FENTANYL CITRATE (PF) 100 MCG/2ML IJ SOLN
INTRAMUSCULAR | Status: AC
  Filled 2021-10-04: qty 2

## 2021-10-04 MED ORDER — BUTAMBEN-TETRACAINE-BENZOCAINE 2-2-14 % EX AERO
INHALATION_SPRAY | CUTANEOUS | Status: AC | PRN
  Administered 2021-10-04: 5 via TOPICAL

## 2021-10-04 MED ORDER — MIDAZOLAM HCL 2 MG/2ML IJ SOLN
INTRAMUSCULAR | Status: AC | PRN
  Administered 2021-10-04: 2 mg via INTRAVENOUS

## 2021-10-04 MED ORDER — SODIUM CHLORIDE 0.9 % IV SOLN: INTRAVENOUS | Status: DC

## 2021-10-04 MED ORDER — SODIUM CHLORIDE FLUSH 0.9 % IV SOLN
INTRAVENOUS | Status: AC
  Filled 2021-10-04: qty 10

## 2021-10-04 NOTE — Assessment & Plan Note (Signed)
   TEE was done 06/06 no vegetations  On IV vancomycin through at least 11/03/2021 per ID

## 2021-10-04 NOTE — TOC Progression Note (Signed)
Transition of Care [facility]) - Progression Note    Patient Details  Name: Amarria Andreasen MRN: [id number] Date of Birth: [birth year]  Transition of Care [facility]) CM/SW [place], RN Phone Number: 10/04/2021, 2:07 PM  Clinical Narrative:   Patient has recommendation for skilled nursing, however she has been adamantly refusing to work with therapy services.  Patient expressed desire to return home but she is unable to use a bariatric bed due to the height of the bed.  [facility] has the bariatric bed, but is unable to supply a stool or other type of elevation equipment to allow patient to get in and out of bed.  It was explained to patient that therapy can assist in assessment for safety both at home and for the purposes of going to a facility.  Patient also requires IV antibiotics until July due to medical situation.    Continuing to explore options for placement/safe discharge TOC to follow.    Expected Discharge Plan: Skilled Nursing Facility Barriers to Discharge: Continued Medical Work up, SNF Pending bed offer, Ship broker  Expected Discharge Plan and Services Expected Discharge Plan: [place]                                               Social Determinants of Health (SDOH) Interventions    Readmission Risk Interventions     View : No data to display.

## 2021-10-04 NOTE — Assessment & Plan Note (Signed)
Resolved

## 2021-10-04 NOTE — CV Procedure (Signed)
Transesophageal echocardiogram preliminary report  Krystal Francis [id number] April 20, 1961  Preliminary diagnosis  Bacteremia with possible endocarditis  Postprocedural diagnosis Normal LV function without evidence of endocarditis or vegetation  Time out A timeout was performed by the nursing staff and physicians specifically identifying the procedure performed, identification of the patient, the type of sedation, all allergies and medications, all pertinent medical history, and presedation assessment of nasopharynx. The patient and or family understand the risks of the procedure including the rare risks of death, stroke, heart attack, esophogeal perforation, sore throat, and reaction to medications given.  Moderate sedation During this procedure the patient has received Versed 2 milligrams and fentanyl 50 micrograms to achieve appropriate moderate sedation.  The patient had continued monitoring of heart rate, oxygenation, blood pressure, respiratory rate, and extent of signs of sedation throughout the entire procedure.  The patient received this moderate sedation over a period of 8 minutes.  Both the nursing staff and I were present during the procedure when the patient had moderate sedation for 100% of the time.  Treatment considerations  No additional treatment considerations needed for bacteremia due to no current evidence of endocarditis  For further details of transesophageal echocardiogram please refer to final report.  Signed,  Corey Skains M.D. [facility] 10/04/2021 10:50 AM

## 2021-10-04 NOTE — Progress Notes (Signed)
Nutrition Follow-up  DOCUMENTATION CODES:   Morbid obesity  INTERVENTION:   -Continue Ensure Enlive po BID, each supplement provides 350 kcal and 20 grams of protein -Continue MVI with minerals daily  NUTRITION DIAGNOSIS:   Increased nutrient needs related to acute illness as evidenced by estimated needs.  Ongoing  GOAL:   Patient will meet greater than or equal to 90% of their needs  Progressing   MONITOR:   PO intake, Supplement acceptance  REASON FOR ASSESSMENT:   Malnutrition Screening Tool    ASSESSMENT:   Pt with medical history significant of RV failure, hypertension, GERD, PE on Lovenox, iron deficiency anemia, urinary retention with Foley catheter placement, uterine bleeding, morbid obesity with BMI 78.27, who presents with shortness of breath.  5/31- s/p Procedure(s): PERIPHERAL VASCULAR THROMBECTOMY (Right)  Reviewed I/O's: +124 ml x 24 hours and -11.9 L since 09/20/21  UOP: 700 ml x 24 hours  Per ID notes, plan for 6 weeks of IV vancomycin for MRSA bacteremia. Concern for endocarditis; plan for TEE today.   Pt down in OR at time of visit.   Pt with good oral intake. Noted meal completions 100%. Pt also consuming Ensure supplements.   Medications reviewed and include magnesium oxide and senokot.  Labs reviewed: CBGS: 95.   Diet Order:   Diet Order             Diet NPO time specified  Diet effective now                   EDUCATION NEEDS:   No education needs have been identified at this time  Skin:  Skin Assessment: Skin Integrity Issues: Skin Integrity Issues:: Other (Comment) Other: non-pressure wound L buttock  Last BM:  10/02/21  Height:   Ht Readings from Last 1 Encounters:  09/17/21 '5\' 4"'$  (1.626 m)    Weight:   Wt Readings from Last 1 Encounters:  10/02/21 (!) 154 kg    Ideal Body Weight:  54.5 kg  BMI:  Body mass index is 58.28 kg/m.  Estimated Nutritional Needs:   Kcal:  1900-2100  Protein:  120-135  grams  Fluid:  > 1.9 L    Loistine Chance, RD, LDN, [place] Registered Dietitian II Certified Diabetes Care and Education Specialist Please refer to [facility] for RD and/or RD on-call/weekend/after hours pager

## 2021-10-04 NOTE — Progress Notes (Signed)
Progress Note   Patient: Krystal Francis WUJ:[id number] DOB: Mar 12, 1961 DOA: 09/15/2021     17 DOS: the patient was seen and examined on 10/04/2021   Brief hospital course: Janaia Kozel is a 61 y.o. female with medical history significant of RV failure, hypertension, GERD, PE on Lovenox, iron deficiency anemia, urinary retention with Foley catheter placement, uterine bleeding, morbid obesity with BMI 78.27, who presents with shortness of breath. Patient had a significant tachycardia, tachypnea, leukocytosis and hypoxemia with oxygen saturation 87%, she met severe sepsis criteria. Her blood culture positive for MRSA, CT angiogram showed multifocal pneumonia with evidence of pulmonary hypertension. She was seen by ID, vancomycin was started.  Assessment and Plan:  Severe sepsis secondary to multifocal pneumonia. Multifocal pneumonia secondary to MRSA. MRSA septicemia. Acute hypoxemic respiratory failure secondary to pneumonia. Ruled in, POA Repeated blood culture drawn on 5/22 and 5/25 still positive for staph.   2nd repeat Bcx on 5/30 negative to date (ex contaminant MRSE in 1 of 4 bottles) TTE negative for thrombus. RUE DVT thrombus cultured and negative. --ID consulted --Continue IV vancomycin (will need minimum 6 weeks) --Cardiology consulted for TEE -done this morning negative for vegetations.   Hypotension -improved Partially due to severe right-sided congestive heart failure.   Treated with albumin, midodrine.  Briefly treated in ICU, was started on IV solumedrol.   Blood pressure better, transferred back to PCU on 5/23.   Solumedrol d/c'ed. --monitor BP --maintain MAP>65  Recent pulmonary emboli On full dose Lovenox PTA, was switched to heparin gtt due to concern of hematuria, this resolved. --cont full dose Lovenox   Right arm swelling 2/2 RUE DVT --pt with severe edema of RUE, and found to have extensive occlusive thrombus  --cont home Lovenox --Vascular consulted  --S/P  thrombectomy 5/31 due to concern of MRSA seeding from the thrombus  -- Thrombus cultured and negative  Acute blood loss anemia with iron deficient anemia. Gross hematuria secondary to indwelling Foley catheter. Patient hemoglobin has been low, but no additional hematuria. Due to severe right-sided congestive heart failure, pt received 1 unit of PRBC. 6/2: Hbg trending down 7.7>>7.3 today.  Pulmonology ordered 1 unit PRBCs transfusion 6/3-6: Hemoglobin DemeLENE improving 8.5 Plan: --cont iron supplement --Monitor Hgb, transfuse if < 7.0   Hypomagnesemia.  Resolved w replacement.  Recurrent on 6/5, replaced. Hypokalemia.  Resolved w replacement --monitor and replete PRN   Hyponatremia -resolved. Monitor BMP.  Morbid obesity with BMI 78.27. Significantly complicates overall care and prognosis.  Recommend lifestyle modifications including physical activity and diet for weight loss and overall [place]-term health.    Chronic right-sided congestive heart failure. Assess daily for diuretic needs. Volume status difficult to assess due to body habitus, no distal lower extremity edema   Left leg severe peripheral arterial disease. Patient has leg pain at rest, duplex ultrasound showed suspected significant peripheral vascular disease.  Patient was seen by vascular surgery, no intervention planned due to bacteremia.   --tramadol PRN --No need for IV opioids   Contact dermatitis  Was wiped down with CHG wipes per Infectious disease protocol and developed blistering to right arm and elbow on both upper arm and forearm.  Improved. --wound care per order   Foley cath, POA, removed --Pt presented with Foley cath.  Per pt, Foley cath was inserted for keeping her skin dry, not for retention.  Removed on 5/25.  Pure wick.   Postmenopausal bleeding In March had secondary severe anemia requiring admission and transfusions. Ongoing for many months. Seen  here in March and transferred to [facility] where she  underwent hysteroscopy, vaginoscopy and endometrial biopsy due to thickened endometrium.  I am not able to find biopsy results in Care Everywhere.  Pt reports being told it was negative. She was noted to have a submucosal fibroid, bleeding has been attributed to that.   --On Provera BID --Patient requesting GYN consult, secure chat sent to Dr. Glennon Mac today (6/6) --Monitor hemoglobin --F/U with Gyn at [facility] as scheduled --Confirm EMB results      Subjective: Patient awake resting in bed when seen on rounds today.  She went for TEE earlier this morning and reports it went well.  She request gynecology consult to do something about her vaginal bleeding.  Reports that she requested transfer to [facility] in March when she presented with anemia due to this problem.  Told her I was unable to locate the results of endometrial biopsy, she states she was told negative no cancer.  Patient otherwise reports feeling okay and has no acute complaints.  Physical Exam: Vitals:   10/04/21 0930 10/04/21 0945 10/04/21 1000 10/04/21 1138  BP: (!) 115/59 105/61 121/60 114/68  Pulse: 83 93 87 91  Resp: 17 (!) 29 20 18   Temp:   98.1 F (36.7 C) 98.2 F (36.8 C)  TempSrc:      SpO2:   93% 92%  Weight:      Height:         Data Reviewed:  Notable labs: Creatinine 0.39, calcium 8.5, magnesium 1.5, hemoglobin improved to 8.5    Family Communication: Updated Sister Bonnita Nasuti by phone afternoon 6/2  Disposition: Status is: Inpatient Remains inpatient appropriate because: Remains on IV antibiotics for bacteremia.  Requires SNF placement which is pending.  No bed offers likely due to BMI. Patient had been declining to go to SNF but reports IV antibiotics at home will not be feasible.   Planned Discharge Destination: Home with [facility]      Time spent: 35 minutes  Author: Ezekiel Slocumb, DO 10/04/2021 2:39 PM  For on call review [email or web address].

## 2021-10-04 NOTE — TOC Progression Note (Signed)
Transition of Care [facility]) - Progression Note    Patient Details  Name: Krystal Francis MRN: [id number] Date of Birth: 07/15/1960  Transition of Care [facility]) CM/SW Contact  Anselm Pancoast, RN Phone Number: 10/04/2021, 11:50 AM  Clinical Narrative:    Discussed case in DTP rounds and discussion had around possible LTAC placement vs SNF placement for IV-antibiotics. [facility] agreed to take a second look at patient as well. [facility] is also reviewing.    Expected Discharge Plan: Skilled Nursing Facility Barriers to Discharge: Continued Medical Work up, SNF Pending bed offer, Ship broker  Expected Discharge Plan and Services Expected Discharge Plan: [place]                                               Social Determinants of Health (SDOH) Interventions    Readmission Risk Interventions     View : No data to display.

## 2021-10-04 NOTE — Progress Notes (Signed)
*  PRELIMINARY RESULTS* Echocardiogram Echocardiogram Transesophageal has been performed.  Sherrie Sport 10/04/2021, 9:16 AM

## 2021-10-04 NOTE — Progress Notes (Signed)
[facility] Encounter Note  Patient: Krystal Francis / Admit Date: 09/15/2021 / Date of Encounter: 10/04/2021, 10:51 AM   Subjective: Patient overall has had some significant improvements since her hospitalization with primary sepsis.  The patient has had bacteremia with Staphylococcus and there were concerns that the patient has endocarditis.  Further evaluation treatment options will be altered with a transesophageal echocardiogram being performed.  TEE showing normal LV systolic function with normal valves and no evidence of significant regurgitation and no evidence of endocarditis.  Of note the patient does have minimal atrial septal aneurysm but no evidence of PFO  Review of Systems: Positive for: Shortness of breath Negative for: Vision change, hearing change, syncope, dizziness, nausea, vomiting,diarrhea, bloody stool, stomach pain, cough, congestion, diaphoresis, urinary frequency, urinary pain, positive for skin lesions, positive for skin rashes Others previously listed  Objective: Telemetry: Normal sinus rhythm Physical Exam: Blood pressure 121/60, pulse 87, temperature 98.1 F (36.7 C), resp. rate 20, height '5\' 4"'$  (1.626 m), weight (!) 154 kg, SpO2 93 %. Body mass index is 58.28 kg/m. General: Well developed, well nourished, in no acute distress. Head: Normocephalic, atraumatic, sclera non-icteric, no xanthomas, nares are without discharge. Neck: No apparent masses Lungs: Normal respirations with no wheezes, no rhonchi, no rales , no crackles   Heart: Regular rate and rhythm, normal S1 S2, no murmur, no rub, no gallop, PMI is normal size and placement, carotid upstroke normal without bruit, jugular venous pressure normal Abdomen: Soft, non-tender, non-distended with normoactive bowel sounds. No hepatosplenomegaly. Abdominal aorta is normal size without bruit Extremities: Severe bilateral edema, no clubbing, no cyanosis, significant Cawley-term skin changes and  possible ulceration Peripheral: 2+ radial, 0+ femoral, trace + dorsal pedal pulses Neuro: Alert and oriented. Moves all extremities spontaneously. Psych:  Responds to questions appropriately with a normal affect.   Intake/Output Summary (Last 24 hours) at 10/04/2021 1051 Last data filed at 10/04/2021 0800 Gross per 24 hour  Intake 823.55 ml  Output 1200 ml  Net -376.45 ml    Inpatient Medications:   butamben-tetracaine-benzocaine       enoxaparin (LOVENOX) injection  120 mg Subcutaneous Q12H   feeding supplement  237 mL Oral BID BM   fentaNYL       iron polysaccharides  150 mg Oral Daily   lidocaine       magnesium oxide  400 mg Oral Daily   medroxyPROGESTERone  10 mg Oral BID   midazolam       multivitamin with minerals  1 tablet Oral Daily   pantoprazole  40 mg Oral Daily   senna-docusate  2 tablet Oral BID   silver sulfADIAZINE   Topical BID   sodium chloride flush       Infusions:   sodium chloride 10 mL/hr at 09/28/21 1136   vancomycin 2,000 mg (10/03/21 2147)    Labs: Recent Labs    10/03/21 0406  NA 138  K 3.9  CL 108  CO2 25  GLUCOSE 95  BUN 9  CREATININE 0.39*  CALCIUM 8.5*  MG 1.5*   No results for input(s): AST, ALT, ALKPHOS, BILITOT, PROT, ALBUMIN in the last 72 hours. Recent Labs    10/02/21 1300 10/03/21 0406  WBC  --  7.6  HGB 8.1* 8.5*  HCT 27.5* 28.9*  MCV  --  77.1*  PLT  --  277   No results for input(s): CKTOTAL, CKMB, TROPONINI in the last 72 hours. Invalid input(s): POCBNP No results for input(s): HGBA1C in  the last 72 hours.   Weights: Filed Weights   09/28/21 0551 10/01/21 0500 10/02/21 0500  Weight: (!) 209 kg (!) 248 kg (!) 154 kg     Radiology/Studies:  CT Angio Chest PE W and/or Wo Contrast  Result Date: 09/17/2021 CLINICAL DATA:  Pulmonary embolism (PE) suspected, high prob. Hematuria, dyspnea EXAM: CT ANGIOGRAPHY CHEST WITH CONTRAST TECHNIQUE: Multidetector CT imaging of the chest was performed using the standard  protocol during bolus administration of intravenous contrast. Multiplanar CT image reconstructions and MIPs were obtained to evaluate the vascular anatomy. RADIATION DOSE REDUCTION: This exam was performed according to the departmental dose-optimization program which includes automated exposure control, adjustment of the mA and/or kV according to patient size and/or use of iterative reconstruction technique. CONTRAST:  270m OMNIPAQUE IOHEXOL 350 MG/ML SOLN (due to requirement for second bolus) COMPARISON:  None Available. FINDINGS: Cardiovascular: Technically limited examination due to bolus timing, body habitus, and respiratory motion artifact. No intraluminal filling defect identified within the main, right, and left pulmonary arteries. Lobar, segmental, and subsegmental pulmonary arteries are not adequately opacified to definitively exclude the presence of acute pulmonary emboli. Central pulmonary arteries are enlarged in keeping with changes of pulmonary arterial hypertension. No significant coronary artery calcification. Global cardiac size within normal limits. No pericardial effusion. Mild atherosclerotic calcification within the thoracic aorta. No aortic aneurysm. Mediastinum/Nodes: There is right axillary adenopathy identified as well as numerous enhancing lymph nodes within the right axilla demonstrating a matted appearance with perinodal infiltration likely related to inflammation or nodal metastatic disease. There is asymmetric edema within the right axilla noted. Right hilar adenopathy is present, possibly reactive in nature. Visualized thyroid is unremarkable. Esophagus is unremarkable. Lungs/Pleura: There is multifocal consolidation within the lungs bilaterally, in keeping with changes of acute multifocal pneumonia in the appropriate clinical setting. No pneumothorax or pleural effusion. Central airways are widely patent. Upper Abdomen: No acute abnormality. Musculoskeletal: No chest wall  abnormality. No acute or significant osseous findings. Review of the MIP images confirms the above findings. IMPRESSION: Technically limited examination without evidence of large central pulmonary embolus. Lobar and peripheral pulmonary arterial vasculature is not adequately opacified on this examination to definitively exclude the presence of acute pulmonary embolus. Morphologic changes in keeping with pulmonary arterial hypertension. Multifocal pulmonary consolidation in keeping with multifocal pneumonic consolidation in the appropriate clinical setting. No central obstructing lesion. Probable reactive right hilar adenopathy. Enhancing right axillary adenopathy demonstrating perinodal infiltration in keeping with acute infection, inflammation or neoplastic infiltration. Asymmetric right axillary edema. Correlation with clinical examination including right breast examination is recommended. Electronically Signed   By: AFidela SalisburyM.D.   On: 09/17/2021 04:34   PERIPHERAL VASCULAR CATHETERIZATION  Result Date: 09/28/2021 See surgical note for result.  [place]Venous Img Upper Uni Right(DVT)  Result Date: 09/20/2021 CLINICAL DATA:  Right arm edema. EXAM: RIGHT UPPER EXTREMITY VENOUS DOPPLER ULTRASOUND TECHNIQUE: Gray-scale sonography with graded compression, as well as color Doppler and duplex ultrasound were performed to evaluate the upper extremity deep venous system from the level of the subclavian vein and including the jugular, axillary, basilic, radial, ulnar and upper cephalic vein. Spectral Doppler was utilized to evaluate flow at rest and with distal augmentation maneuvers. COMPARISON:  None Available. FINDINGS: Contralateral Subclavian Vein: Respiratory phasicity is normal and symmetric with the symptomatic side. No evidence of thrombus. Normal compressibility. Internal Jugular Vein: Occlusive thrombus. Subclavian Vein: Occlusive thrombus. Axillary Vein: Occlusive thrombus. Cephalic Vein: Proximal  occlusive thrombus. Basilic Vein: Nearly occlusive thrombus with  only trace residual flow evident. Brachial Veins: Occlusive thrombus. Radial Veins: Occlusive thrombus. Ulnar Veins: Diminutive but patent. IMPRESSION: 1. Extensive occlusive thrombus in the right upper extremity as above. These results will be called to the ordering clinician or representative by the Radiologist Assistant, and communication documented in the PACS or [employer]. Electronically Signed   By: Misty Stanley M.D.   On: 09/20/2021 07:18   US ARTERIAL LOWER EXTREMITY DUPLEX LEFT (NON-ABI)  Result Date: 09/19/2021 CLINICAL DATA:  Left lower extremity pain.  Evaluate for PAD. EXAM: LEFT LOWER EXTREMITY ARTERIAL DUPLEX SCAN TECHNIQUE: Gray-scale sonography as well as color Doppler and duplex ultrasound was performed to evaluate the lower extremity arteries including the common, superficial and profunda femoral arteries, popliteal artery and calf arteries. COMPARISON:  None Available. FINDINGS: Left lower Extremity: Left Common femoral artery: Triphasic waveform; 201 cm/sec Normal waveform is demonstrated within the left common femoral artery however there is borderline elevated peak systolic velocity of 299 cm/sec, findings suggestive of potential inflow (aortoiliac disease). Left superficial femoral artery: Biphasic waveform Proximal: 109 cm/sec Mid: 118 cm/sec Distal: 81 cm/sec Left deep femoral artery: Biphasic waveform; 90 cm/sec Left popliteal artery: Biphasic waveform; 78 cm/sec Biphasic waveforms are demonstrated throughout the interrogated portions of the left superficial, and deep femoral arteries as well as the popliteal artery. Velocity differences between the mid and distal aspects of the left superficial femoral artery suggests hemodynamically significant narrowing regional to this location. Left anterior tibial artery: Monophasic waveform; 18 cm/sec Left posterior tibial artery: Biphasic waveform; 97 cm/sec Tibial runoff  is patent the level of the lower leg however blunted monophasic waveform is demonstrated within the left anterior tibial artery with associated markedly reduced velocity suggestive of atretic flow. Biphasic waveform is demonstrated within the left posterior tibial artery. IMPRESSION: Suspected hemodynamically significant narrowings involving inflow, outflow and tibial runoff of the left lower extremity. Further evaluation with CTA run-off could be performed as indicated. Electronically Signed   By: Sandi Mariscal M.D.   On: 09/19/2021 10:36   DG Chest Port 1 View  Result Date: 09/29/2021 CLINICAL DATA:  Pneumonia. EXAM: PORTABLE CHEST 1 VIEW COMPARISON:  CTA chest 09/17/2021 FINDINGS: Cardiac silhouette is again mildly to moderately enlarged. Mediastinal contours within normal limits. Mild bilateral interstitial thickening. There patchy heterogeneous bilateral airspace opacities that are persistent but likely slightly improved from 09/17/2021 frontal scout view for prior chest CT. No pleural effusion or pneumothorax. IMPRESSION: 1. Stable mild cardiomegaly. 2. Bilateral patchy heterogeneous airspace opacities again suspicious for multifocal infection. These may be slightly improved from prior 09/17/2021 CT, however differences in modality limit comparison. Electronically Signed   By: Yvonne Kendall M.D.   On: 09/29/2021 09:05   DG C-Arm 1-60 Min-No Report  Result Date: 09/28/2021 Fluoroscopy was utilized by the requesting physician.  No radiographic interpretation.   ECHOCARDIOGRAM COMPLETE  Result Date: 09/21/2021    ECHOCARDIOGRAM REPORT   Patient Name:   Krystal Francis Date of Exam: 09/21/2021 Medical Rec #:  [id number]  Height:       64.0 in Accession #:    [id number] Weight:       459.0 lb Date of Birth:  Feb 23, 1961 BSA:          2.785 m Patient Age:    73 years   BP:           126/57 mmHg Patient Gender: F          HR:  78 bpm. Exam Location:  [facility] Procedure: 2D Echo, Cardiac Doppler and Color  Doppler Indications:     Endocarditis I38  History:         Patient has no prior history of Echocardiogram examinations.                  Risk Factors:Hypertension.  Sonographer:     Sherrie Sport Referring Phys:  [id number] Sharen Hones Diagnosing Phys: Kate Sable MD  Sonographer Comments: Technically challenging study due to limited acoustic windows, no apical window and no subcostal window. IMPRESSIONS  1. Left ventricular ejection fraction, by estimation, is 60 to 65%. The left ventricle has normal function. The left ventricle has no regional wall motion abnormalities. Left ventricular diastolic function could not be evaluated.  2. Right ventricular systolic function is normal. The right ventricular size is not well visualized.  3. The mitral valve is normal in structure. No evidence of mitral valve regurgitation.  4. The aortic valve is tricuspid. Aortic valve regurgitation is not visualized. FINDINGS  Left Ventricle: Left ventricular ejection fraction, by estimation, is 60 to 65%. The left ventricle has normal function. The left ventricle has no regional wall motion abnormalities. The left ventricular internal cavity size was normal in size. There is  no left ventricular hypertrophy. Left ventricular diastolic function could not be evaluated. Right Ventricle: The right ventricular size is not well visualized. No increase in right ventricular wall thickness. Right ventricular systolic function is normal. Left Atrium: Left atrial size was normal in size. Right Atrium: Right atrial size was not well visualized. Pericardium: There is no evidence of pericardial effusion. Mitral Valve: The mitral valve is normal in structure. No evidence of mitral valve regurgitation. Tricuspid Valve: The tricuspid valve is normal in structure. Tricuspid valve regurgitation is mild. Aortic Valve: The aortic valve is tricuspid. Aortic valve regurgitation is not visualized. Pulmonic Valve: The pulmonic valve was not well visualized.  Pulmonic valve regurgitation is not visualized. Aorta: The aortic root is normal in size and structure. Venous: The inferior vena cava was not well visualized. IAS/Shunts: The interatrial septum was not well visualized.  LEFT VENTRICLE PLAX 2D LVIDd:         4.90 cm LVIDs:         3.10 cm LV PW:         1.30 cm LV IVS:        0.70 cm  LEFT ATRIUM         Index LA diam:    4.20 cm 1.51 cm/m                        PULMONIC VALVE AORTA                 PV Vmax:        0.75 m/s Ao Root diam: 3.30 cm PV Vmean:       45.200 cm/s                       PV VTI:         0.138 m                       PV Peak grad:   2.3 mmHg                       PV Mean grad:   1.0 mmHg  RVOT Peak grad: 4 mmHg  TRICUSPID VALVE TR Peak grad:   17.0 mmHg TR Vmax:        206.00 cm/s  SHUNTS Pulmonic VTI: 0.197 m Kate Sable MD Electronically signed by Kate Sable MD Signature Date/Time: 09/21/2021/5:09:53 PM    Final      Assessment and Recommendation  61 y.o. female with multiple medical issues having admission for apparent sepsis and bacteremia for major concerns for the possibility of endocarditis now improved with appropriate medication management.  The patient's transesophageal echocardiogram showed no evidence of endocarditis or other structural or functional issues requiring further intervention 1.  Continue supportive care, for multiple medical problems, previous sepsis, and current antibiotic treatment for bacteremia 2.  No further cardiac intervention and/or diagnostics necessary at this time due to no evidence of endocarditis or structural cardiovascular symptoms  Signed, Serafina Royals M.D. FACC

## 2021-10-05 ENCOUNTER — Encounter: Payer: Self-pay | Admitting: Internal Medicine

## 2021-10-05 DIAGNOSIS — J189 Pneumonia, unspecified organism: Secondary | ICD-10-CM | POA: Diagnosis not present

## 2021-10-05 LAB — BASIC METABOLIC PANEL
Anion gap: 7 (ref 5–15)
BUN: 8 mg/dL (ref 6–20)
CO2: 24 mmol/L (ref 22–32)
Calcium: 8.6 mg/dL — ABNORMAL LOW (ref 8.9–10.3)
Chloride: 105 mmol/L (ref 98–111)
Creatinine, Ser: 0.42 mg/dL — ABNORMAL LOW (ref 0.44–1.00)
GFR, Estimated: >60 mL/min (ref >60)
Glucose, Bld: 99 mg/dL (ref 70–99)
Potassium: 3.7 mmol/L (ref 3.5–5.1)
Sodium: 136 mmol/L (ref 135–145)

## 2021-10-05 LAB — CBC
HCT: 28.3 % — ABNORMAL LOW (ref 36.0–46.0)
Hemoglobin: 8.3 g/dL — ABNORMAL LOW (ref 12.0–15.0)
MCH: 22.7 pg — ABNORMAL LOW (ref 26.0–34.0)
MCHC: 29.3 g/dL — ABNORMAL LOW (ref 30.0–36.0)
MCV: 77.5 fL — ABNORMAL LOW (ref 80.0–100.0)
Platelets: 281 10*3/uL (ref 150–400)
RBC: 3.65 MIL/uL — ABNORMAL LOW (ref 3.87–5.11)
RDW: 24.7 % — ABNORMAL HIGH (ref 11.5–15.5)
WBC: 5 10*3/uL (ref 4.0–10.5)
nRBC: 0 % (ref 0.0–0.2)

## 2021-10-05 LAB — MAGNESIUM: Magnesium: 1.6 mg/dL — ABNORMAL LOW (ref 1.7–2.4)

## 2021-10-05 LAB — VANCOMYCIN, TROUGH: Vancomycin Tr: 12 ug/mL — ABNORMAL LOW (ref 15–20)

## 2021-10-05 MED ORDER — MAGNESIUM SULFATE 2 GM/50ML IV SOLN
2.0000 g | Freq: Once | INTRAVENOUS | Status: AC
  Administered 2021-10-05: 2 g via INTRAVENOUS
  Filled 2021-10-05: qty 50

## 2021-10-05 NOTE — Progress Notes (Signed)
Patient refused lab work. MD, Sheppard Coil notified.

## 2021-10-05 NOTE — Consult Note (Signed)
Pharmacy Antibiotic Note  Krystal Francis is a 61 y.o. female w/ h/o RV failure, HTN, GERD, PE PTA on Lovenox, iron deficiency anemia, urinary retention with Foley catheter placement, uterine bleeding, morbid obesity with BMI 78.27, who presents with shortness of breath > w/u c/w PE & multifocal PNA. Pt admitted on 09/15/2021 with pneumonia and bacteremia & BCID resulted with MRSA 4 of 4 vials on 5/20.  Pharmacy has been consulted for escalation of abx to Vancomycin dosing.   -planning 6 weeks IV Vancomycin per ID  Day # 19 vancomycin MRSA bacteremia Day # 8 from first blood culture with No growth Renal: SCr stable Repeat blood cxs 5/25: 1 still with Staph aureus Repeat blood cx 5/30 with 1/4 GPC  but BCID detected MRSE (contaminant) 6/6 TEE: neg for endocarditis  6/7: vancomycin trough 12 mcg/mL at 2138 =  on vancomycin 2gm IV q24h  Previous Vancomycin levels: 6/1 vancomycin 2gm IV q24h given at 22:19 6/2 vanco peak = 26 mcg/ml at  02:02 6/2 random vancomycin level = 12.0 at 18:29 True Cmax = 28.2, Cmin = 10 and AUC = 425    Plan:    Continue vancomycin  to 2000 mg IV every 24 hours. Follow renal function  Follow 5/30 blood cx Await ability to place PICC once Blood cx remain negative Await OPAT and discharge disposition, patient unable to give IV antibiotic to self at home   Height: '5\' 4"'$  (162.6 cm) Weight: (!) 240 kg (529 lb 1.7 oz) IBW/kg (Calculated) : 54.7  Temp (24hrs), Avg:98.3 F (36.8 C), Min:98.1 F (36.7 C), Max:98.8 F (37.1 C)  Recent Labs  Lab 09/30/21 0202 09/30/21 1829 10/01/21 0138 10/03/21 0406 10/05/21 0528  WBC 9.5  --  7.9 7.6 5.0  CREATININE 0.62  --   --  0.39* 0.42*  VANCOPEAK 26*  --   --   --   --   VANCORANDOM  --  12  --   --   --      Estimated Creatinine Clearance: 154 mL/min (A) (by C-G formula based on SCr of 0.42 mg/dL (L)).    Allergies  Allergen Reactions   Furosemide Rash    Per [facility] records - rash possibly to IV furosemide but  tablets ok   Chlorhexidine Hives, Dermatitis, Rash and Other (See Comments)    Blisters right after CHG wipe down    Antimicrobials this admission: Azithro (5/20-5/21) CRO x1 in ED (5/20) VAN (5/20 >>   Dose adjustments this admission: 5/22: Vancomycin levels  on 1gm IV q12h (with 5th maintenance dose Vancomycin peak 5/22 at 2141 = 39 mcg/mL Vancomycin trough 5/23 at 0504 = 30 mcg/mL Vanc adj to '1750mg'$  IV q24h  5/24 Vanc random'@2126'$ = 15 mcg/ml   No chg  5/27 Vanc '1750mg'$  hung'@2222'$  -infuse over 2 hrs         5/28   Vanc P'@0023'$ =38  mcg/ml         5/28   Vanc T'@2044'$ =10 mcg/ml        -Vanc adj to 2000 mg IV q24h   Microbiology results: 5/20 BCx: BCID 4 of 4 vials MRSA 5/22 BCx: S aureus, MRSA 5/25: Bcx: L hand 2150: NG          Bcx: L hand 2204: +Staph aureus  5/20 Sputum: MRSA 5/20 Flu/Cov PCR: negative    Thank you for allowing pharmacy to be a part of this patient's care.  Vallery Sa, PharmD, BCPS  10/05/2021 2:35 PM

## 2021-10-05 NOTE — Consult Note (Signed)
Pharmacy Antibiotic Note  Krystal Francis is a 61 y.o. female w/ h/o RV failure, HTN, GERD, PE PTA on Lovenox, iron deficiency anemia, urinary retention with Foley catheter placement, uterine bleeding, morbid obesity with BMI 78.27, who presents with shortness of breath > w/u c/w PE & multifocal PNA. Pt admitted on 09/15/2021 with pneumonia and bacteremia & BCID resulted with MRSA 4 of 4 vials on 5/20.  Pharmacy has been consulted for escalation of abx to Vancomycin dosing.   -planning 6 weeks IV Vancomycin per ID  Day # 19 vancomycin MRSA bacteremia Day # 8 from first blood culture with No growth Renal: SCr stable Repeat blood cxs 5/25: 1 still with Staph aureus Repeat blood cx 5/30 with 1/4 GPC  but BCID detected MRSE (contaminant) 6/6 TEE: neg for endocarditis  6/7: vancomycin trough at 2130 =  on vancomycin 2gm IV q24h  Previous Vancomycin levels: 6/1 vancomycin 2gm IV q24h given at 22:19 6/2 vanco peak = 26 mcg/ml at  02:02 6/2 random vancomycin level = 12.0 at 18:29 True Cmax = 28.2, Cmin = 10 and AUC = 425    Plan:    Continue vancomycin  to 2000 mg IV every 24 hours. Check vancomycin trough this evening Follow renal function  Follow 5/30 blood cx Await ability to place PICC once Blood cx remain negative Await OPAT and discharge disposition, patient unable to give IV antibiotic to self at home   Height: '5\' 4"'$  (162.6 cm) Weight: (!) 240 kg (529 lb 1.7 oz) IBW/kg (Calculated) : 54.7  Temp (24hrs), Avg:98.3 F (36.8 C), Min:98.1 F (36.7 C), Max:98.8 F (37.1 C)  Recent Labs  Lab 09/30/21 0202 09/30/21 1829 10/01/21 0138 10/03/21 0406 10/05/21 0528  WBC 9.5  --  7.9 7.6 5.0  CREATININE 0.62  --   --  0.39* 0.42*  VANCOPEAK 26*  --   --   --   --   VANCORANDOM  --  12  --   --   --      Estimated Creatinine Clearance: 154 mL/min (A) (by C-G formula based on SCr of 0.42 mg/dL (L)).    Allergies  Allergen Reactions   Furosemide Rash    Per [facility] records - rash  possibly to IV furosemide but tablets ok   Chlorhexidine Hives, Dermatitis, Rash and Other (See Comments)    Blisters right after CHG wipe down    Antimicrobials this admission: Azithro (5/20-5/21) CRO x1 in ED (5/20) VAN (5/20 >>   Dose adjustments this admission: 5/22: Vancomycin levels  on 1gm IV q12h (with 5th maintenance dose Vancomycin peak 5/22 at 2141 = 39 mcg/mL Vancomycin trough 5/23 at 0504 = 30 mcg/mL Vanc adj to '1750mg'$  IV q24h  5/24 Vanc random'@2126'$ = 15 mcg/ml   No chg  5/27 Vanc '1750mg'$  hung'@2222'$  -infuse over 2 hrs         5/28   Vanc P'@0023'$ =38  mcg/ml         5/28   Vanc T'@2044'$ =10 mcg/ml        -Vanc adj to 2000 mg IV q24h   Microbiology results: 5/20 BCx: BCID 4 of 4 vials MRSA 5/22 BCx: S aureus, MRSA 5/25: Bcx: L hand 2150: NG          Bcx: L hand 2204: +Staph aureus  5/20 Sputum: MRSA 5/20 Flu/Cov PCR: negative    Thank you for allowing pharmacy to be a part of this patient's care.  Doreene Eland, PharmD, BCPS, BCIDP Work Cell: [phone] 10/05/2021  9:57 AM

## 2021-10-05 NOTE — Progress Notes (Signed)
PROGRESS NOTE    Krystal Francis  TWS:[id number] DOB: [birth year]  DOA: 09/15/2021 Date of Service: 10/05/21 PCP: Latanya Maudlin, NP     Brief Narrative / Hospital Course:  Krystal Francis is a 61 y.o. female with medical history significant of RV failure, hypertension, GERD, PE on Lovenox, iron deficiency anemia, urinary retention with Foley catheter placement, uterine bleeding, morbid obesity with BMI 78.27, who presents with shortness of breath 09/17/2021. Patient had a significant tachycardia, tachypnea, leukocytosis and hypoxemia with oxygen saturation 87%, she met severe sepsis criteria. Her blood culture positive for MRSA, CT angiogram showed multifocal pneumonia with evidence of pulmonary hypertension. No PE noted but unable to r/o either. Extensive occlusive thrombus RUE notes 05/23 on [place], vascular consulted. Thrombectomy done 05/31. She was seen by ID, vancomycin was started to continue for min 6 weeks. TEE 06/06 no vegetations, cardiology s/o.   Consultants:  Infectious Disease Vascular Surgery  Cardiology  Procedures: 09/28/21: Thrombectomy RUE 10/04/21: Transesophageal Echocardiogram     Subjective: Patient reports greatest complaint is vaginal bleeding,    ASSESSMENT & PLAN:   Principal Problem:   Multifocal pneumonia Active Problems:   Severe sepsis ([place])   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HCC)   Pulmonary embolism (HCC)   HTN (hypertension)   Urinary retention   Iron deficiency anemia   Hematuria   Morbid obesity with body mass index of 70 and over in adult [facility])   MRSA bacteremia   Hypomagnesemia   Acute hypoxemic respiratory failure (HCC)   Arterial hypotension   Palliative care by specialist   Goals of care, counseling/discussion   Postmenopausal bleeding   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[place]) Patient has morbid obesity, very difficult to assess volume status.  BNP is normal CT 7.1.  2D echo on 08/16/21: showed normal left ventricular systolic function with mild LVH, moderate  RV systolic dysfunction. Patient received 40 mg Lasix in the ED Will continue torsemide 20 mg daily 06/06 TEE no cocnerns, cardiology has signed off    Hematuria Urinalysis negative. Overall suspecting that the Foley balloon may have been in the urethra causing some local bleeding,  no evidence of ongoing hematuria. Pt also has intermittent vaginal bleeding. Monitor Hbg  Multifocal pneumonia Possible multifocal pneumonia and sepsis due to pneumonia: CTA is limited study, did not show central PE, showed multifocal infiltration.  Patient had leukocytosis with WBC 16.7, indicating possible multifocal pneumonia.  Patient met criteria for sepsis with WBC 16.7, heart rate 114, 26.  Lactic acid normal 0.9, 1.4. IV Rocephin and azithromycin (pt received one dose of IV Vancomycin and cefepime in ED) --> vancomycin per ID  Severe sepsis ([place]) Resolved   Pulmonary embolism ([place]) Patient is on Lovenox, hx PE and recent DVT RUE s/p thrombectomy  HTN (hypertension) IV hydralazine as needed Patient is on torsemide  Iron deficiency anemia Hemoglobin 8.6 on 09/15/2021, 7.7 today.  Hemoglobin drop is likely due to vaginal bleeding which is chronic.  She also had hematuria earlier this admission which has resolved. Continue iron supplement Likely uterine bleeding also a contributing factor May need transfer to [facility] for OBGYN to eval for hysterectomy if Hgb dropping  Morbid obesity with body mass index of 70 and over in adult [facility]) BMI=78.27   and BW= 206.8 Diet and exercise.   Encouraged to lose weight.   Postmenopausal bleeding Ongoing for many months. Seen here in March and transferred to [facility] where she underwent hysteroscopy, vaginoscopy and endometrial biopsy.  I am not able to find biopsy results  in [place].  Pt reports being told it was negative. She was noted to have a submucosal fibroid, bleeding has been attributed to that.  Also has thickened endometrium. On Provera BID -->  increased to TID F/U with Gyn at [facility] as scheduled previous hospitalist per report has spoken w/ OBGYN and was adivsed can increase provera to tid but any surgical intervention would need to be done at tertiary care center d/t weight   Palliative care by specialist .  Hypomagnesemia Mg 1.5 on 6/5, given 2 g IV Mg-sulfate. Monitor & replace Mg PRN  Arterial hypotension Resolved.  MRSA bacteremia TEE was done 06/06 no vegetations On IV vancomycin through at least 11/03/2021 per ID    DVT prophylaxis: Lovenox Code Status: FULL Family Communication: none Disposition Plan / TOC needs: pt is refusing labs, refusing to participate w/ PT/OT Barriers to discharge / significant pending items: need to confirm Hgb to possibly transfer to intervene on uterine bleeding, if pt declines labs again tomorrow will discsuss further             Objective: Vitals:   10/05/21 0359 10/05/21 0433 10/05/21 0833 10/05/21 1406  BP: 124/68  109/63 (!) 114/56  Pulse: 97  (!) 102 84  Resp: 18   16  Temp: 98.1 F (36.7 C)  98.2 F (36.8 C) 98.2 F (36.8 C)  TempSrc:      SpO2: 95%  93% 95%  Weight:  (!) 240 kg    Height:       No intake or output data in the 24 hours ending 10/05/21 1524 Filed Weights   10/01/21 0500 10/02/21 0500 10/05/21 0433  Weight: (!) 248 kg (!) 154 kg (!) 240 kg    Examination:  Constitutional:  VS as above General Appearance: alert, well-developed, well-nourished, NAD Eyes: Normal lids and conjunctive, non-icteric sclera Ears, Nose, Mouth, Throat: Normal appearance Neck: No masses, trachea midline Respiratory: Normal respiratory effort Breath sounds normal, no wheeze/rhonchi/rales Cardiovascular: S1/S2 normal, no murmur/rub/gallop auscultated No lower extremity edema Gastrointestinal: Nontender, no masses Habitus limits exam  Musculoskeletal:  No clubbing/cyanosis of digits Neurological: No cranial nerve deficit on limited exam Motor and  sensation intact and symmetric Psychiatric: Normal judgment/insight Normal mood and affect       Scheduled Medications:   enoxaparin (LOVENOX) injection  120 mg Subcutaneous Q12H   feeding supplement  237 mL Oral BID BM   iron polysaccharides  150 mg Oral Daily   magnesium oxide  400 mg Oral Daily   medroxyPROGESTERone  10 mg Oral TID   multivitamin with minerals  1 tablet Oral Daily   pantoprazole  40 mg Oral Daily   senna-docusate  2 tablet Oral BID    Continuous Infusions:  sodium chloride 10 mL/hr at 09/28/21 1136   magnesium sulfate bolus IVPB 2 g (10/05/21 1455)   vancomycin Stopped (10/05/21 0849)    PRN Medications:  acetaminophen, albuterol, diphenhydrAMINE, docusate sodium, guaiFENesin-dextromethorphan, hydrALAZINE, HYDROmorphone (DILAUDID) injection, magnesium hydroxide, ondansetron **OR** ondansetron (ZOFRAN) IV, polyethylene glycol, traMADol, traZODone, triamcinolone cream  Antimicrobials:  Anti-infectives (From admission, onward)    Start     Dose/Rate Route Frequency Ordered Stop   09/29/21 2100  vancomycin (VANCOREADY) IVPB 2000 mg/400 mL        2,000 mg 200 mL/hr over 120 Minutes Intravenous Every 24 hours 09/29/21 1211     09/28/21 2000  vancomycin (VANCOCIN) 2,000 mg in sodium chloride 0.9 % 500 mL IVPB  Status:  Discontinued  2,000 mg 260 mL/hr over 120 Minutes Intravenous Every 24 hours 09/28/21 1522 09/29/21 1211   09/28/21 2000  vancomycin (VANCOREADY) IVPB 2000 mg/400 mL  Status:  Discontinued        2,000 mg 200 mL/hr over 120 Minutes Intravenous Every 24 hours 09/28/21 1543 09/28/21 1545   09/26/21 2000  vancomycin (VANCOREADY) IVPB 2000 mg/400 mL  Status:  Discontinued        2,000 mg 200 mL/hr over 120 Minutes Intravenous Every 24 hours 09/26/21 1451 09/28/21 1522   09/20/21 1800  vancomycin (VANCOREADY) IVPB 1750 mg/350 mL  Status:  Discontinued        1,750 mg 175 mL/hr over 120 Minutes Intravenous Every 24 hours 09/20/21 0616  09/26/21 1451   09/20/21 0600  vancomycin (VANCOCIN) 1,500 mg in sodium chloride 0.9 % 500 mL IVPB  Status:  Discontinued        1,500 mg 250 mL/hr over 120 Minutes Intravenous Every 12 hours 09/20/21 0102 09/20/21 0615   09/17/21 1900  vancomycin (VANCOREADY) IVPB 1500 mg/300 mL  Status:  Discontinued        1,500 mg 150 mL/hr over 120 Minutes Intravenous Every 12 hours 09/17/21 1857 09/20/21 0101   09/17/21 1200  cefTRIAXone (ROCEPHIN) 2 g in sodium chloride 0.9 % 100 mL IVPB  Status:  Discontinued        2 g 200 mL/hr over 30 Minutes Intravenous Every 24 hours 09/17/21 0622 09/17/21 1844   09/17/21 1000  azithromycin (ZITHROMAX) 500 mg in sodium chloride 0.9 % 250 mL IVPB  Status:  Discontinued        500 mg 250 mL/hr over 60 Minutes Intravenous Every 24 hours 09/17/21 0622 09/18/21 1247   09/17/21 0445  ceFEPIme (MAXIPIME) 1 g in sodium chloride 0.9 % 100 mL IVPB        1 g 200 mL/hr over 30 Minutes Intravenous  Once 09/17/21 0441 09/18/21 0743   09/17/21 0445  vancomycin (VANCOREADY) IVPB 2000 mg/400 mL        2,000 mg 200 mL/hr over 120 Minutes Intravenous  Once 09/17/21 0441 09/18/21 1437       Data Reviewed: I have personally reviewed following labs and imaging studies  CBC: Recent Labs  Lab 09/30/21 0202 10/01/21 0138 10/02/21 1300 10/03/21 0406 10/05/21 0528  WBC 9.5 7.9  --  7.6 5.0  NEUTROABS  --  5.2  --   --   --   HGB 7.3* 8.0* 8.1* 8.5* 8.3*  HCT 25.1* 26.9* 27.5* 28.9* 28.3*  MCV 76.5* 75.8*  --  77.1* 77.5*  PLT 266 262  --  277 188   Basic Metabolic Panel: Recent Labs  Lab 09/30/21 0202 10/03/21 0406 10/05/21 0528  NA 135 138 136  K 3.8 3.9 3.7  CL 107 108 105  CO2 23 25 24   GLUCOSE 108* 95 99  BUN 9 9 8   CREATININE 0.62 0.39* 0.42*  CALCIUM 8.2* 8.5* 8.6*  MG 1.7 1.5* 1.6*   GFR: Estimated Creatinine Clearance: 154 mL/min (A) (by C-G formula based on SCr of 0.42 mg/dL (L)). Liver Function Tests: No results for input(s): AST, ALT, ALKPHOS,  BILITOT, PROT, ALBUMIN in the last 168 hours. No results for input(s): LIPASE, AMYLASE in the last 168 hours. No results for input(s): AMMONIA in the last 168 hours. Coagulation Profile: No results for input(s): INR, PROTIME in the last 168 hours. Cardiac Enzymes: No results for input(s): CKTOTAL, CKMB, CKMBINDEX, TROPONINI in the last 168 hours. BNP (  last 3 results) No results for input(s): PROBNP in the last 8760 hours. HbA1C: No results for input(s): HGBA1C in the last 72 hours. CBG: Recent Labs  Lab 10/02/21 2103  GLUCAP 95   Lipid Profile: No results for input(s): CHOL, HDL, LDLCALC, TRIG, CHOLHDL, LDLDIRECT in the last 72 hours. Thyroid Function Tests: No results for input(s): TSH, T4TOTAL, FREET4, T3FREE, THYROIDAB in the last 72 hours. Anemia Panel: No results for input(s): VITAMINB12, FOLATE, FERRITIN, TIBC, IRON, RETICCTPCT in the last 72 hours. Urine analysis:    Component Value Date/Time   COLORURINE AMBER (A) 09/15/2021 1554   APPEARANCEUR HAZY (A) 09/15/2021 1554   LABSPEC 1.020 09/15/2021 1554   PHURINE 6.0 09/15/2021 1554   GLUCOSEU NEGATIVE 09/15/2021 1554   HGBUR SMALL (A) 09/15/2021 1554   BILIRUBINUR NEGATIVE 09/15/2021 1554   KETONESUR NEGATIVE 09/15/2021 1554   PROTEINUR NEGATIVE 09/15/2021 1554   NITRITE NEGATIVE 09/15/2021 1554   LEUKOCYTESUR NEGATIVE 09/15/2021 1554   Sepsis Labs: @LABRCNTIP (procalcitonin:4,lacticidven:4)  Recent Results (from the past 240 hour(s))  Culture, blood (Routine X 2) w Reflex to ID Panel     Status: Abnormal   Collection Time: 09/27/21  5:19 AM   Specimen: BLOOD  Result Value Ref Range Status   Specimen Description   Final    BLOOD LEFT AC Performed at [facility], [address], [place], [place] [address]    Special Requests   Final    BOTTLES DRAWN AEROBIC ONLY Blood Culture results may not be optimal due to an inadequate volume of blood received in culture bottles Performed at [facility], [address] [place], [place] [address]    Culture  Setup Time   Final    GRAM POSITIVE COCCI AEROBIC BOTTLE ONLY Organism ID to follow CRITICAL RESULT CALLED TO, READ BACK BY AND VERIFIED WITH: ALEX CHAPPELL AT 7510 ON 09/28/21 BY SS Performed at [facility], [address] [place], [place] [address]    Culture (A)  Final    STAPHYLOCOCCUS EPIDERMIDIS THE SIGNIFICANCE OF ISOLATING THIS ORGANISM FROM A SINGLE SET OF BLOOD CULTURES WHEN MULTIPLE SETS ARE DRAWN IS UNCERTAIN. PLEASE NOTIFY THE MICROBIOLOGY DEPARTMENT WITHIN ONE WEEK IF SPECIATION AND SENSITIVITIES ARE REQUIRED. Performed at [facility], [place] [address] [place], [place] [address]    Report Status 09/30/2021 FINAL  Final  Blood Culture ID Panel (Reflexed)     Status: Abnormal   Collection Time: 09/27/21  5:19 AM  Result Value Ref Range Status   Enterococcus faecalis NOT DETECTED NOT DETECTED Final   Enterococcus Faecium NOT DETECTED NOT DETECTED Final   Listeria monocytogenes NOT DETECTED NOT DETECTED Final   Staphylococcus species DETECTED (A) NOT DETECTED Final    Comment: CRITICAL RESULT CALLED TO, READ BACK BY AND VERIFIED WITH: ALEX CHAPPELL AT 1540 ON 09/28/21 BY SS    Staphylococcus aureus (BCID) NOT DETECTED NOT DETECTED Final   Staphylococcus epidermidis DETECTED (A) NOT DETECTED Final    Comment: Methicillin (oxacillin) resistant coagulase negative staphylococcus. Possible blood culture contaminant (unless isolated from more than one blood culture draw or clinical case suggests pathogenicity). No antibiotic treatment is indicated for blood  culture contaminants. CRITICAL RESULT CALLED TO, READ BACK BY AND VERIFIED WITH: ALEX CHAPPELL AT 2353 ON 09/28/21 BY SS    Staphylococcus lugdunensis NOT DETECTED NOT DETECTED Final   Streptococcus species NOT DETECTED NOT DETECTED Final   Streptococcus agalactiae NOT DETECTED NOT DETECTED Final   Streptococcus pneumoniae NOT DETECTED NOT DETECTED  Final   Streptococcus pyogenes NOT DETECTED NOT DETECTED Final   A.calcoaceticus-baumannii NOT DETECTED NOT DETECTED Final   Bacteroides fragilis NOT DETECTED NOT DETECTED Final   Enterobacterales NOT DETECTED NOT DETECTED Final   Enterobacter cloacae complex NOT DETECTED NOT DETECTED Final   Escherichia coli NOT DETECTED NOT DETECTED Final   Klebsiella aerogenes NOT DETECTED NOT DETECTED Final   Klebsiella oxytoca NOT DETECTED NOT DETECTED Final   Klebsiella pneumoniae NOT DETECTED NOT DETECTED Final   Proteus species NOT DETECTED NOT DETECTED Final   Salmonella species NOT DETECTED NOT DETECTED Final   Serratia marcescens NOT DETECTED NOT DETECTED Final   Haemophilus influenzae NOT DETECTED NOT DETECTED Final   Neisseria meningitidis NOT DETECTED NOT DETECTED Final   Pseudomonas aeruginosa NOT DETECTED NOT DETECTED Final   Stenotrophomonas maltophilia NOT DETECTED NOT DETECTED Final   Candida albicans NOT DETECTED NOT DETECTED Final   Candida auris NOT DETECTED NOT DETECTED Final   Candida glabrata NOT DETECTED NOT DETECTED Final   Candida krusei NOT DETECTED NOT DETECTED Final   Candida parapsilosis NOT DETECTED NOT DETECTED Final   Candida tropicalis NOT DETECTED NOT DETECTED Final   Cryptococcus neoformans/gattii NOT DETECTED NOT DETECTED Final   Methicillin resistance mecA/C DETECTED (A) NOT DETECTED Final    Comment: CRITICAL RESULT CALLED TO, READ BACK BY AND VERIFIED WITH: ALEX CHAPPELL AT 2505 ON 09/28/21 BY SS Performed at [facility], [address] [place], [place] [address]   Culture, blood (Routine X 2) w Reflex to ID Panel     Status: None   Collection Time: 09/27/21  5:20 AM   Specimen: BLOOD  Result Value Ref Range Status   Specimen Description BLOOD LEFT HAND  Final   Special Requests   Final    BOTTLES DRAWN AEROBIC AND ANAEROBIC Blood Culture adequate volume   Culture   Final    NO GROWTH 5 DAYS Performed at [facility], [address] [place], [place] [address]    Report Status 10/02/2021 FINAL  Final  Aerobic/Anaerobic Culture w Gram Stain (surgical/deep wound)     Status: None   Collection Time: 09/28/21  1:16 PM   Specimen: PATH Other; Tissue  Result Value Ref Range Status   Specimen Description TISSUE  Final   Special Requests RIGHT ARM TISSUE  Final   Gram Stain   Final    NO SQUAMOUS EPITHELIAL CELLS SEEN FEW WBC SEEN NO ORGANISMS SEEN    Culture   Final    No growth aerobically or anaerobically. Performed at [facility], [place] [address] [place], [place] [address]    Report Status 10/03/2021 FINAL  Final         Radiology Studies last 96 hours: ECHO TEE  Result Date: 10/04/2021    TRANSESOPHOGEAL ECHO REPORT   Patient Name:   Krystal Francis Date of Exam: 10/04/2021 Medical Rec #:  [id number]  Height:       64.0 in Accession #:    [id number] Weight:       339.5 lb Date of Birth:  January 28, 1961 BSA:          2.450 m Patient Age:    73 years   BP:           101/53 mmHg Patient Gender: F          HR:           90 bpm. Exam Location:  [facility] Procedure: Transesophageal Echo, Color Doppler and Cardiac  Doppler Indications:     Not listed on TEE check-in sheet  History:         Patient has prior history of Echocardiogram examinations, most                  recent 09/21/2021. Risk Factors:Hypertension. Anemia.  Sonographer:     Sherrie Sport Referring Phys:  [place] Diagnosing Phys: Serafina Royals MD PROCEDURE: The transesophogeal probe was passed without difficulty through the esophogus of the patient. Sedation performed by performing physician. The patient developed no complications during the procedure. IMPRESSIONS  1. Left ventricular ejection fraction, by estimation, is 60 to 65%. The left ventricle has normal function. The left ventricle has no regional wall motion abnormalities.  2. Right ventricular systolic function is normal. The right ventricular size is normal.  3. No left atrial/left atrial  appendage thrombus was detected.  4. The mitral valve is normal in structure. Trivial mitral valve regurgitation.  5. The aortic valve is normal in structure. Aortic valve regurgitation is not visualized.  6. Agitated saline contrast bubble study was negative, with no evidence of any interatrial shunt. FINDINGS  Left Ventricle: Left ventricular ejection fraction, by estimation, is 60 to 65%. The left ventricle has normal function. The left ventricle has no regional wall motion abnormalities. The left ventricular internal cavity size was normal in size. Right Ventricle: The right ventricular size is normal. No increase in right ventricular wall thickness. Right ventricular systolic function is normal. Left Atrium: Left atrial size was normal in size. No left atrial/left atrial appendage thrombus was detected. Right Atrium: Right atrial size was normal in size. Pericardium: There is no evidence of pericardial effusion. Mitral Valve: The mitral valve is normal in structure. Trivial mitral valve regurgitation. There is no evidence of mitral valve vegetation. Tricuspid Valve: The tricuspid valve is normal in structure. Tricuspid valve regurgitation is trivial. There is no evidence of tricuspid valve vegetation. Aortic Valve: The aortic valve is normal in structure. Aortic valve regurgitation is not visualized. There is no evidence of aortic valve vegetation. Pulmonic Valve: The pulmonic valve was normal in structure. Pulmonic valve regurgitation is trivial. There is no evidence of pulmonic valve vegetation. Aorta: The aortic root and ascending aorta are structurally normal, with no evidence of dilitation. There is minimal (Grade I) plaque. IAS/Shunts: The interatrial septum is aneurysmal. No atrial level shunt detected by color flow Doppler. Agitated saline contrast was given intravenously to evaluate for intracardiac shunting. Agitated saline contrast bubble study was negative, with no evidence of any interatrial shunt.  Serafina Royals MD Electronically signed by Serafina Royals MD Signature Date/Time: 10/04/2021/1:30:19 PM    Final             LOS: 18 days    Time spent: 77 min    Emeterio Reeve, DO [facility] 10/05/2021, 3:24 PM   Staff may message me via secure chat in [place]  but this may not receive immediate response,  please page for urgent matters!  If 7PM-7AM, please contact night-coverage [email or web address]  Dictation software was used to generate the above note. Typos may occur and escape review, as with typed/written notes. Please contact Dr Sheppard Coil directly for clarity if needed.

## 2021-10-06 DIAGNOSIS — J189 Pneumonia, unspecified organism: Secondary | ICD-10-CM | POA: Diagnosis not present

## 2021-10-06 DIAGNOSIS — R7881 Bacteremia: Secondary | ICD-10-CM | POA: Diagnosis not present

## 2021-10-06 DIAGNOSIS — B9562 Methicillin resistant Staphylococcus aureus infection as the cause of diseases classified elsewhere: Secondary | ICD-10-CM | POA: Diagnosis not present

## 2021-10-06 DIAGNOSIS — J15212 Pneumonia due to Methicillin resistant Staphylococcus aureus: Secondary | ICD-10-CM | POA: Diagnosis not present

## 2021-10-06 DIAGNOSIS — I82721 Chronic embolism and thrombosis of deep veins of right upper extremity: Secondary | ICD-10-CM | POA: Diagnosis not present

## 2021-10-06 LAB — BASIC METABOLIC PANEL
Anion gap: 4 — ABNORMAL LOW (ref 5–15)
BUN: 8 mg/dL (ref 6–20)
CO2: 26 mmol/L (ref 22–32)
Calcium: 8.8 mg/dL — ABNORMAL LOW (ref 8.9–10.3)
Chloride: 107 mmol/L (ref 98–111)
Creatinine, Ser: 0.44 mg/dL (ref 0.44–1.00)
GFR, Estimated: >60 mL/min (ref >60)
Glucose, Bld: 89 mg/dL (ref 70–99)
Potassium: 4 mmol/L (ref 3.5–5.1)
Sodium: 137 mmol/L (ref 135–145)

## 2021-10-06 LAB — CBC
HCT: 28 % — ABNORMAL LOW (ref 36.0–46.0)
Hemoglobin: 8.1 g/dL — ABNORMAL LOW (ref 12.0–15.0)
MCH: 22.6 pg — ABNORMAL LOW (ref 26.0–34.0)
MCHC: 28.9 g/dL — ABNORMAL LOW (ref 30.0–36.0)
MCV: 78.2 fL — ABNORMAL LOW (ref 80.0–100.0)
Platelets: 283 10*3/uL (ref 150–400)
RBC: 3.58 MIL/uL — ABNORMAL LOW (ref 3.87–5.11)
RDW: 24.7 % — ABNORMAL HIGH (ref 11.5–15.5)
WBC: 4.3 10*3/uL (ref 4.0–10.5)
nRBC: 0 % (ref 0.0–0.2)

## 2021-10-06 NOTE — TOC Progression Note (Signed)
Transition of Care [facility]) - Progression Note    Patient Details  Name: Merleen Picazo MRN: [id number] Date of Birth: October 28, 1960  Transition of Care [facility]) CM/SW [place], RN Phone Number: 10/06/2021, 3:49 PM  Clinical Narrative:   Patient continues to refuse to work with therapy, stating not needed.  Patient continues to refuse to go to SNF.  All members of care team have spoken with patient at great lengths, and numerous times to discuss the safety risk of patient discharging home, especially not knowing her capability to get into and out of bed and to be able to provide for herself.  Patient has been advised that only bed is 35 inches off ground and DME agency not able to supply stepstool to facilitate transfer.  Patient continues to request discharge home.  Carolynn Sayers advised due to patient requiring IV antibiotics, states she is aware of patient discharge home.   [place] advised of need for home health.  [facility] notified of bed, but again uncertain about getting step to assist patient into bed.  TOC following to coordinate discharge.    Expected Discharge Plan: Skilled Nursing Facility Barriers to Discharge: Continued Medical Work up, SNF Pending bed offer, Ship broker  Expected Discharge Plan and Services Expected Discharge Plan: [place]                                               Social Determinants of Health (SDOH) Interventions    Readmission Risk Interventions     No data to display

## 2021-10-06 NOTE — Progress Notes (Signed)
   Date of Admission:  09/15/2021    ID: Krystal Francis is a 61 y.o. female  Principal Problem:   Multifocal pneumonia Active Problems:   Morbid obesity with body mass index of 70 and over in adult [facility])   HTN (hypertension)   Severe sepsis (HCC)   Hematuria   Iron deficiency anemia   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[place])   Pulmonary embolism (HCC)   Urinary retention   MRSA bacteremia   Hypomagnesemia   Acute hypoxemic respiratory failure (HCC)   Arterial hypotension   Palliative care by specialist   Goals of care, counseling/discussion   Postmenopausal bleeding    Subjective: Pt is feeling okay Arranging to have a new bed and also trying to see whether some one can help with Iv antibiotic at home   Medications:   enoxaparin (LOVENOX) injection  120 mg Subcutaneous Q12H   feeding supplement  237 mL Oral BID BM   iron polysaccharides  150 mg Oral Daily   magnesium oxide  400 mg Oral Daily   medroxyPROGESTERone  10 mg Oral TID   multivitamin with minerals  1 tablet Oral Daily   pantoprazole  40 mg Oral Daily   senna-docusate  2 tablet Oral BID    Objective: Vital signs in last 24 hours: Temp:  [98 F (36.7 C)-98.6 F (37 C)] 98.2 F (36.8 C) (06/08 1601) Pulse Rate:  [80-99] 81 (06/08 1601) Resp:  [16-18] 16 (06/08 1601) BP: (108-120)/(57-62) 119/61 (06/08 1601) SpO2:  [93 %-97 %] 96 % (06/08 1601) Weight:  [234.5 kg] 234.5 kg (06/08 0500)   PHYSICAL EXAM:  General: Alert, cooperative, no distress BMI 58 Lungs: b/l air entry- few basal crepts Heart: Regular rate and rhythm, no murmur, rub or gallop. Abdomen: Soft, non-tender,not distended. Bowel sounds normal. No masses Extremities: rt arm edema much better Scaling, scabs of forearm Lymphedema legs  Left > rt Skin Peeling skin    Neurologic: Grossly non-focal  Lab Results Recent Labs    10/05/21 0528 10/06/21 0719  WBC 5.0 4.3  HGB 8.3* 8.1*  HCT 28.3* 28.0*  NA 136 137  K 3.7 4.0  CL 105 107  CO2 24 26   BUN 8 8  CREATININE 0.42* 0.44    Microbiology: 09/17/21 4/4 MRSA 09/19/21- MRSA bacteremia 2/4 blood culture 09/22/21 MRSA 09/27/2021: Blood cultures 1 of 4  staph epidermidis MRSE ( this is not MRSA- this is a skin contaminant) MRSA nares positive  09/17/21- MRSA in sputum culture Assessment/Plan:  MRSA bacteremia with b/l MRSA pneumonia Concern for rt upper extremity thrombus seeded with MRSA- s/p thrombectomy TEE done on 10/04/21 no endocarditis 6 weeks of IV vancomycin until 11/03/21   Rt upper extremity DVT- underwent thrombectomy as there was concern for seeding because ofperssitent bacteremia Culture of thrombus neg  MRSE 1 of 4- skin contaminant  Allergic skin reaction to CHG  Recent PE with rt ventricle strain when she was at [facility]  Anemia with h/o vaginal bleed - on provera  Needed multiple transfusion in the past at [facility]  Lymphedema legs  Disposition pending Discussed the management with the careteam

## 2021-10-06 NOTE — Progress Notes (Addendum)
NAME:  Krystal Francis, MRN:  [id number], DOB:  March 15, 1961, LOS: 40 ADMISSION DATE:  09/15/2021, CONSULTATION DATE:  09/20/21 REFERRING MD:  Morton Amy, NP, CHIEF COMPLAINT:  Shortness of breath  History of Present Illness:  61 year old female presenting to [facility] ED on 09/15/2021 from home via EMS with complaints of hematuria and her chronic Foley catheter over the last couple of days.  ED course: 09/15/2021: patient was worked up for possible UTI, vaginal bleeding and/or Foley catheter obstruction or malfunction.  Foley catheter was replaced with immediate return of clear yellow urine and suspicion that Foley balloon may have irritated urethra causing some local bleeding.  Pelvic exam revealed no vaginal bleeding.  Patient was stabilized for discharge however she states she was unable to go home.  She is unable to get in and out of bed & complete ADLs.  EDP placed TOC consult as well as PT and OT consults. 09/17/2021: EDP called bedside after patient has been boarding for 35 hours due to complaints of shortness of breath with associated tachycardia.  Patient had not received any outpatient medications including her Lasix or Lovenox.  CT angio performed to rule out pulmonary embolus, limited due to body habitus but does not show any large PEs.  CT chest however concerning for multifocal pneumonia with lab work revealing leukocytosis.  Sepsis work-up completed with cefepime and vancomycin and 1 L IV fluid bolus.  TRH consulted for admission.   09/30/21- patient reports freezing despite having external bair hugger warming system.  She has had constipation with abdominal discomfort.  We discussed severe symptomatic anemia and she is interested in having blood transfusion. Due to severe symptomatic anemia and possible ongoing blood loss will order Prbc transfusion.   10/06/21- patient is on 2L/min [place].  No hemoptysis.  She continues to lose low level blood. She is interested in hysterectomy.  From pulmonary standpoint she  is stable, no hemoptysis.  Multiple specialists on case   CT angio chest 09/17/21 : Technically limited examination without evidence of large central pulmonary embolus. Lobar and peripheral pulmonary arterial vasculature is not adequately opacified on this examination to definitively exclude the presence of acute pulmonary embolus. Morphologic changes in keeping with pulmonary arterial hypertension. Multifocal pulmonary consolidation in keeping with multifocal pneumonic consolidation in the appropriate clinical setting. No central obstructing lesion. Probable reactive right hilar adenopathy.  Enhancing right axillary adenopathy demonstrating perinodal infiltration in keeping with acute infection, inflammation or neoplastic infiltration. Asymmetric right axillary edema. Correlation with clinical examination including right breast examination is recommended.   US arterial left lower extremity 09/18/21: Suspected hemodynamically significant narrowings involving inflow, outflow and tibial runoff of the left lower extremity. Further evaluation with CTA run-off could be performed as indicated.  PCCM consulted for additional evaluation with prolonged hospitalization having multifocal pneumonia    Pertinent  Medical History  RV failure HTN GERD PE on lovenox Iron deficiency anemia Urinary retention with chronic foley catheter Uterine bleeding Morbid obesity BMI 78.27 HTN  Significant Hospital Events: Including procedures, antibiotic start and stop dates in addition to other pertinent events   09/17/21: admit to inpatient with sepsis s/t suspected HCAP. ID consulted, BCx + for MRSA 09/19/21: vascular consulted for severe LLE PVD 09/20/21: overnight transfer to ICU/SDU for potential vasopressors   Objective   Blood pressure 108/62, pulse 80, temperature 98.6 F (37 C), temperature source Oral, resp. rate 16, height '5\' 4"'$  (1.626 m), weight (!) 234.5 kg, SpO2 95 %.  Intake/Output  Summary (Last 24 hours) at 10/06/2021 1403 Last data filed at 10/05/2021 1647 Gross per 24 hour  Intake 50 ml  Output --  Net 50 ml    Filed Weights   10/02/21 0500 10/05/21 0433 10/06/21 0500  Weight: (!) 154 kg (!) 240 kg (!) 234.5 kg    Examination: General: Adult female,, lying in bed, NAD HEENT: MM pink/moist, anicteric, atraumatic, neck supple Neuro: A&O x 4, able to follow commands, PERRL +3, MAE CV: s1s2 RRR, ST on monitor, no r/m/g Pulm: Regular, non labored on  , breath sounds clear-BUL & diminished-BLL GI: soft, obese, non tender, bs x 4 GU: foley in place with clear yellow urine Skin: small blisters covering body, RUE red/swollen/painful   Extremities: warm/dry, pulses + 2 R/P, +2 edema noted RUE  Resolved Hospital Problem list     Assessment & Plan:   Sepsis MRSA bacteremia & RV failure  Present on admission  Patient asymptomatic now - continuous cardiac monitoring  Multifocal pneumonia Component Ref Range & Units 3 d ago 11 d ago 13 d ago  Enterococcus faecalis NOT DETECTED NOT DETECTED  NOT DETECTED  NOT DETECTED   Enterococcus Faecium NOT DETECTED NOT DETECTED  NOT DETECTED  NOT DETECTED   Listeria monocytogenes NOT DETECTED NOT DETECTED  NOT DETECTED  NOT DETECTED   Staphylococcus species NOT DETECTED DETECTED Abnormal   DETECTED Abnormal  CM  DETECTED Abnormal  CM   Comment: CRITICAL RESULT CALLED TO, READ BACK BY AND VERIFIED WITH:  ALEX CHAPPELL AT 1540 ON 09/28/21 BY SS   Staphylococcus aureus (BCID) NOT DETECTED NOT DETECTED  DETECTED Abnormal  CM  DETECTED Abnormal  CM   Staphylococcus epidermidis NOT DETECTED DETECTED Abnormal       RUE edema and blisters secondary to suspected allergic reaction to CHG wipes vs DVT  Per patient similar thing happened at [facility] after being exposed to those wipes - benadryl PRN   Recurrent pulmonary emboli    Patient with anemia s/p prbc transfusion  Best Practice (right click and "Reselect all SmartList  Selections" daily)  Diet/type: Regular consistency (see orders) DVT prophylaxis: systemic heparin GI prophylaxis: PPI Lines: N/A Foley:  Yes, and it is still needed Code Status:  full code Last date of multidisciplinary goals of care discussion [per primary]  Labs   CBC: Recent Labs  Lab 09/30/21 0202 10/01/21 0138 10/02/21 1300 10/03/21 0406 10/05/21 0528 10/06/21 0719  WBC 9.5 7.9  --  7.6 5.0 4.3  NEUTROABS  --  5.2  --   --   --   --   HGB 7.3* 8.0* 8.1* 8.5* 8.3* 8.1*  HCT 25.1* 26.9* 27.5* 28.9* 28.3* 28.0*  MCV 76.5* 75.8*  --  77.1* 77.5* 78.2*  PLT 266 262  --  277 281 283     Basic Metabolic Panel: Recent Labs  Lab 09/30/21 0202 10/03/21 0406 10/05/21 0528 10/06/21 0719  NA 135 138 136 137  K 3.8 3.9 3.7 4.0  CL 107 108 105 107  CO2 '23 25 24 26  '$ GLUCOSE 108* 95 99 89  BUN '9 9 8 8  '$ CREATININE 0.62 0.39* 0.42* 0.44  CALCIUM 8.2* 8.5* 8.6* 8.8*  MG 1.7 1.5* 1.6*  --     GFR: Estimated Creatinine Clearance: 151.3 mL/min (by C-G formula based on SCr of 0.44 mg/dL). Recent Labs  Lab 10/01/21 0138 10/03/21 0406 10/05/21 0528 10/06/21 0719  WBC 7.9 7.6 5.0 4.3     Liver Function Tests:  No results for input(s): "AST", "ALT", "ALKPHOS", "BILITOT", "PROT", "ALBUMIN" in the last 168 hours. No results for input(s): "LIPASE", "AMYLASE" in the last 168 hours. No results for input(s): "AMMONIA" in the last 168 hours.  ABG No results found for: "PHART", "PCO2ART", "PO2ART", "HCO3", "TCO2", "ACIDBASEDEF", "O2SAT"   Coagulation Profile: No results for input(s): "INR", "PROTIME" in the last 168 hours.   Cardiac Enzymes: No results for input(s): "CKTOTAL", "CKMB", "CKMBINDEX", "TROPONINI" in the last 168 hours.  HbA1C: No results found for: "HGBA1C"  CBG: Recent Labs  Lab 10/02/21 2103  GLUCAP 95     Review of Systems: positives in BOLD  Gen: Denies fever, chills, weight change, fatigue, night sweats HEENT: Denies blurred vision, double  vision, hearing loss, tinnitus, sinus congestion, rhinorrhea, sore throat, neck stiffness, dysphagia PULM: Denies shortness of breath, cough, sputum production, hemoptysis, wheezing CV: Denies chest pain, edema, orthopnea, paroxysmal nocturnal dyspnea, palpitations GI: Denies abdominal pain, nausea, vomiting, diarrhea, hematochezia, melena, constipation, change in bowel habits GU: Denies dysuria, hematuria, polyuria, oliguria, urethral discharge Endocrine: Denies hot or cold intolerance, polyuria, polyphagia or appetite change Derm: Denies rash, swelling, pain, dry skin, scaling or peeling skin change Heme: Denies easy bruising, bleeding, bleeding gums Neuro: Denies headache, numbness, weakness, slurred speech, loss of memory or consciousness  Past Medical History:  She,  has a past medical history of Anemia, unspecified and Hypertension.   Surgical History:   Past Surgical History:  Procedure Laterality Date   DILATION AND CURETTAGE OF UTERUS     PERIPHERAL VASCULAR THROMBECTOMY Right 09/28/2021   Procedure: PERIPHERAL VASCULAR THROMBECTOMY;  Surgeon: Katha Cabal, MD;  Location: [place] [facility];  Service: Cardiovascular;  Laterality: Right;   TEE WITHOUT CARDIOVERSION N/A 10/04/2021   Procedure: TRANSESOPHAGEAL ECHOCARDIOGRAM (TEE);  Surgeon: Corey Skains, MD;  Location: [facility];  Service: Cardiovascular;  Laterality: N/A;   THROMBECTOMY BRACHIAL ARTERY Right 09/28/2021   Procedure: THROMBECTOMY RIGHT UPPER EXTREMITY;  Surgeon: Katha Cabal, MD;  Location: [facility] OR[facility];  Service: Vascular;  Laterality: Right;     Social History:   reports that she has never smoked. She has never used smokeless tobacco. She reports that she does not drink alcohol and does not use drugs.   Family History:  Her family history includes Diabetes Mellitus I in her father; Hypertension in her father and mother.   Allergies Allergies  Allergen Reactions   Furosemide Rash    Per [facility]  records - rash possibly to IV furosemide but tablets ok   Chlorhexidine Hives, Dermatitis, Rash and Other (See Comments)    Blisters right after CHG wipe down     Home Medications  Prior to Admission medications   Medication Sig Start Date End Date Taking? Authorizing Provider  enoxaparin (LOVENOX) 120 MG/0.8ML injection Inject 120 mg into the skin daily. 09/01/21  Yes [provider]  ferrous sulfate 325 (65 FE) MG EC tablet Take 1 tablet (325 mg total) by mouth 3 (three) times daily with meals. 07/21/21 07/21/22 Yes Lorella Nimrod, MD  furosemide (LASIX) 40 MG tablet Take 40 mg by mouth daily. 07/31/21  Yes [provider]  magnesium oxide (MAG-OX) 400 (240 Mg) MG tablet Take 1 tablet by mouth daily. 06/17/21  Yes [provider]  medroxyPROGESTERone (PROVERA) 10 MG tablet Take 10 mg by mouth in the morning and at bedtime. 07/12/21  Yes [provider]  pantoprazole (PROTONIX) 40 MG tablet Take 40 mg by mouth daily. 09/01/21  Yes [provider]  torsemide (DEMADEX) 20 MG tablet Take 20 mg by mouth daily. 09/01/21  Yes [provider]  vitamin B-12 (CYANOCOBALAMIN) 1000 MCG tablet Take by mouth. 07/12/21  Yes [provider]      Ottie Glazier, M.D.  [facility] [place]

## 2021-10-06 NOTE — Progress Notes (Signed)
PROGRESS NOTE    Krystal Francis  QPY:[id number] DOB: 1961/03/09  DOA: 09/15/2021 Date of Service: 10/06/21 PCP: Latanya Maudlin, NP     Brief Narrative / Hospital Course:  Krystal Francis is a 61 y.o. female with medical history significant of RV failure, hypertension, GERD, PE on Lovenox, iron deficiency anemia, urinary retention with Foley catheter placement, uterine bleeding, morbid obesity with BMI 78.27, who presents with shortness of breath 09/17/2021. Patient had a significant tachycardia, tachypnea, leukocytosis and hypoxemia with oxygen saturation 87%, she met severe sepsis criteria. Her blood culture positive for MRSA, CT angiogram showed multifocal pneumonia with evidence of pulmonary hypertension. No PE noted but unable to r/o either. Extensive occlusive thrombus RUE notes 05/23 on [place], vascular consulted. Thrombectomy done 05/31. She was seen by ID, vancomycin was started to continue for min 6 weeks. TEE 06/06 no vegetations, cardiology s/o. Respiratory stable 06/08 pulmonary s/o. 06/08 dispo continued to be an issue - pt refusing PT and refusing SNF, requesting home w/ HH to manage IV abx and different hospital bed, TOC following.   Consultants:  Infectious Disease Vascular Surgery  Cardiology PCCU  Procedures: 09/28/21: Thrombectomy RUE 10/04/21: Transesophageal Echocardiogram     Subjective: Patient states no complaints today other than would like to go home when possible, trying to arrange bed. She states she cannot get out of bed and back INTO it which is why she's been refusing PT here. She still does NOT want to go to SNF for PT/IV Abx   ASSESSMENT & PLAN:   Principal Problem:   Multifocal pneumonia Active Problems:   Severe sepsis ([place])   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HCC)   Pulmonary embolism (HCC)   HTN (hypertension)   Urinary retention   Iron deficiency anemia   Hematuria   Morbid obesity with body mass index of 70 and over in adult [facility])   MRSA bacteremia    Hypomagnesemia   Acute hypoxemic respiratory failure (HCC)   Arterial hypotension   Palliative care by specialist   Goals of care, counseling/discussion   Postmenopausal bleeding   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[place]) Patient has morbid obesity, very difficult to assess volume status.   2D echo on 08/16/21: showed normal left ventricular systolic function with mild LVH, moderate RV systolic dysfunction. Will continue torsemide 20 mg daily 06/06 TEE no cocnerns, cardiology has signed off    Hematuria Urinalysis negative. Overall suspecting that the Foley balloon may have been in the urethra causing some local bleeding,  no evidence of ongoing hematuria. Pt also has intermittent vaginal bleeding. Monitor Hbg  Multifocal pneumonia multifocal pneumonia and sepsis due to pneumonia: CTA is limited study, did not show central PE, showed multifocal infiltration.  Patient had leukocytosis with WBC 16.7, indicating possible multifocal pneumonia.  Patient met criteria for sepsis with WBC 16.7, heart rate 114, 26.  Lactic acid normal 0.9, 1.4. IV Rocephin and azithromycin (pt received one dose of IV Vancomycin and cefepime in ED) --> vancomycin per ID Given MRSA bacteremia will need IV abx x6 weeks total  Severe sepsis (HCC) Resolved   Pulmonary embolism (HCC) Patient is on Lovenox, hx PE and recent DVT RUE s/p thrombectomy Plan to transition to Xarelto given BMI   HTN (hypertension) IV hydralazine as needed Patient is on torsemide  Iron deficiency anemia Hemoglobin 8.6 on 09/15/2021, 7.7 today.  Hemoglobin drop is likely due to vaginal bleeding which is chronic.  She also had hematuria earlier this admission which has resolved. Continue iron supplement uterine bleeding and  need for anticoagulation are also contributing  May need transfer to [facility] for OBGYN to eval for hysterectomy if Hgb dropping but has been stable for now  Morbid obesity with body mass index of 70 and over in adult  [facility]) BMI=78.27   and BW= 206.8 Diet and exercise.   Encouraged to lose weight. BMI and associated ambulatory dysfunction precludes safe placement home until equipment can be arranged   Postmenopausal bleeding Ongoing for many months. Seen here in March and transferred to [facility] where she underwent hysteroscopy, vaginoscopy and endometrial biopsy.  I am not able to find biopsy results in Care Everywhere.  Pt reports being told it was negative. She was noted to have a submucosal fibroid, bleeding has been attributed to that.  Also has thickened endometrium. On Provera BID --> increased to TID F/U with Gyn at [facility] as scheduled previous hospitalist per report has spoken w/ OBGYN and was adivsed can increase provera to tid (done) but any surgical intervention would need to be done at tertiary care center d/t weight  Needing permanent anticoagulation d/t recent DVT and Hx PE  Palliative care by specialist .  Hypomagnesemia Mg 1.5 on 6/5, given 2 g IV Mg-sulfate. Monitor & replace Mg PRN  Arterial hypotension Resolved.  MRSA bacteremia TEE was done 06/06 no vegetations On IV vancomycin through at least 11/03/2021 per ID    DVT prophylaxis: Lovenox Code Status: FULL Family Communication: none Disposition Plan / TOC needs: if dropping Hgb may be able to justify transfer for hysterectomy, given BMI will need to be teritary care center. Pt requesting home discharge, we are trying to arrange IV abx at home and DME.  Barriers to discharge / significant pending items: see above, TOC following              Objective: Vitals:   10/06/21 0500 10/06/21 0509 10/06/21 0800 10/06/21 1601  BP:  (!) 115/57 108/62 119/61  Pulse:  99 80 81  Resp:  _0 Temp:  98.3 F (36.8 C) 98.6 F (37 C) 98.2 F (36.8 C)  TempSrc:  Oral Oral   SpO2:  93% 95% 96%  Weight: (!) 234.5 kg     Height:        Intake/Output Summary (Last 24 hours) at 10/06/2021 1656 Last data filed at 10/06/2021  1634 Gross per 24 hour  Intake --  Output 900 ml  Net -900 ml   Filed Weights   10/02/21 0500 10/05/21 0433 10/06/21 0500  Weight: (!) 154 kg (!) 240 kg (!) 234.5 kg    Examination:  Constitutional:  VS as above General Appearance: alert, well-developed, well-nourished, NAD Eyes: Normal lids and conjunctive, non-icteric sclera Ears, Nose, Mouth, Throat: Normal appearance Neck: No masses, trachea midline Respiratory: Normal respiratory effort Breath sounds normal, no wheeze/rhonchi/rales Cardiovascular: S1/S2 normal, no murmur/rub/gallop auscultated No lower extremity edema Gastrointestinal: Nontender, no masses Habitus limits exam  Musculoskeletal:  No clubbing/cyanosis of digits Neurological: No cranial nerve deficit on limited exam Motor and sensation intact and symmetric Psychiatric: Normal judgment/insight Normal mood and affect       Scheduled Medications:   enoxaparin (LOVENOX) injection  120 mg Subcutaneous Q12H   feeding supplement  237 mL Oral BID BM   iron polysaccharides  150 mg Oral Daily   magnesium oxide  400 mg Oral Daily   medroxyPROGESTERone  10 mg Oral TID   multivitamin with minerals  1 tablet Oral Daily   pantoprazole  40 mg Oral Daily  senna-docusate  2 tablet Oral BID    Continuous Infusions:  sodium chloride 10 mL/hr at 09/28/21 1136   vancomycin Stopped (10/06/21 0937)    PRN Medications:  acetaminophen, albuterol, diphenhydrAMINE, docusate sodium, guaiFENesin-dextromethorphan, hydrALAZINE, HYDROmorphone (DILAUDID) injection, magnesium hydroxide, ondansetron **OR** ondansetron (ZOFRAN) IV, polyethylene glycol, traMADol, traZODone, triamcinolone cream  Antimicrobials:  Anti-infectives (From admission, onward)    Start     Dose/Rate Route Frequency Ordered Stop   09/29/21 2100  vancomycin (VANCOREADY) IVPB 2000 mg/400 mL        2,000 mg 200 mL/hr over 120 Minutes Intravenous Every 24 hours 09/29/21 1211     09/28/21 2000   vancomycin (VANCOCIN) 2,000 mg in sodium chloride 0.9 % 500 mL IVPB  Status:  Discontinued        2,000 mg 260 mL/hr over 120 Minutes Intravenous Every 24 hours 09/28/21 1522 09/29/21 1211   09/28/21 2000  vancomycin (VANCOREADY) IVPB 2000 mg/400 mL  Status:  Discontinued        2,000 mg 200 mL/hr over 120 Minutes Intravenous Every 24 hours 09/28/21 1543 09/28/21 1545   09/26/21 2000  vancomycin (VANCOREADY) IVPB 2000 mg/400 mL  Status:  Discontinued        2,000 mg 200 mL/hr over 120 Minutes Intravenous Every 24 hours 09/26/21 1451 09/28/21 1522   09/20/21 1800  vancomycin (VANCOREADY) IVPB 1750 mg/350 mL  Status:  Discontinued        1,750 mg 175 mL/hr over 120 Minutes Intravenous Every 24 hours 09/20/21 0616 09/26/21 1451   09/20/21 0600  vancomycin (VANCOCIN) 1,500 mg in sodium chloride 0.9 % 500 mL IVPB  Status:  Discontinued        1,500 mg 250 mL/hr over 120 Minutes Intravenous Every 12 hours 09/20/21 0102 09/20/21 0615   09/17/21 1900  vancomycin (VANCOREADY) IVPB 1500 mg/300 mL  Status:  Discontinued        1,500 mg 150 mL/hr over 120 Minutes Intravenous Every 12 hours 09/17/21 1857 09/20/21 0101   09/17/21 1200  cefTRIAXone (ROCEPHIN) 2 g in sodium chloride 0.9 % 100 mL IVPB  Status:  Discontinued        2 g 200 mL/hr over 30 Minutes Intravenous Every 24 hours 09/17/21 0622 09/17/21 1844   09/17/21 1000  azithromycin (ZITHROMAX) 500 mg in sodium chloride 0.9 % 250 mL IVPB  Status:  Discontinued        500 mg 250 mL/hr over 60 Minutes Intravenous Every 24 hours 09/17/21 0622 09/18/21 1247   09/17/21 0445  ceFEPIme (MAXIPIME) 1 g in sodium chloride 0.9 % 100 mL IVPB        1 g 200 mL/hr over 30 Minutes Intravenous  Once 09/17/21 0441 09/18/21 0743   09/17/21 0445  vancomycin (VANCOREADY) IVPB 2000 mg/400 mL        2,000 mg 200 mL/hr over 120 Minutes Intravenous  Once 09/17/21 0441 09/18/21 1437       Data Reviewed: I have personally reviewed following labs and imaging  studies  CBC: Recent Labs  Lab 09/30/21 0202 10/01/21 0138 10/02/21 1300 10/03/21 0406 10/05/21 0528 10/06/21 0719  WBC 9.5 7.9  --  7.6 5.0 4.3  NEUTROABS  --  5.2  --   --   --   --   HGB 7.3* 8.0* 8.1* 8.5* 8.3* 8.1*  HCT 25.1* 26.9* 27.5* 28.9* 28.3* 28.0*  MCV 76.5* 75.8*  --  77.1* 77.5* 78.2*  PLT 266 262  --  277 281 283  Basic Metabolic Panel: Recent Labs  Lab 09/30/21 0202 10/03/21 0406 10/05/21 0528 10/06/21 0719  NA 135 138 136 137  K 3.8 3.9 3.7 4.0  CL 107 108 105 107  CO2 _0 GLUCOSE 108* 95 99 89  BUN _1 CREATININE 0.62 0.39* 0.42* 0.44  CALCIUM 8.2* 8.5* 8.6* 8.8*  MG 1.7 1.5* 1.6*  --    GFR: Estimated Creatinine Clearance: 151.3 mL/min (by C-G formula based on SCr of 0.44 mg/dL). Liver Function Tests: No results for input(s): "AST", "ALT", "ALKPHOS", "BILITOT", "PROT", "ALBUMIN" in the last 168 hours. No results for input(s): "LIPASE", "AMYLASE" in the last 168 hours. No results for input(s): "AMMONIA" in the last 168 hours. Coagulation Profile: No results for input(s): "INR", "PROTIME" in the last 168 hours. Cardiac Enzymes: No results for input(s): "CKTOTAL", "CKMB", "CKMBINDEX", "TROPONINI" in the last 168 hours. BNP (last 3 results) No results for input(s): "PROBNP" in the last 8760 hours. HbA1C: No results for input(s): "HGBA1C" in the last 72 hours. CBG: Recent Labs  Lab 10/02/21 2103  GLUCAP 95   Lipid Profile: No results for input(s): "CHOL", "HDL", "LDLCALC", "TRIG", "CHOLHDL", "LDLDIRECT" in the last 72 hours. Thyroid Function Tests: No results for input(s): "TSH", "T4TOTAL", "FREET4", "T3FREE", "THYROIDAB" in the last 72 hours. Anemia Panel: No results for input(s): "VITAMINB12", "FOLATE", "FERRITIN", "TIBC", "IRON", "RETICCTPCT" in the last 72 hours. Urine analysis:    Component Value Date/Time   COLORURINE AMBER (A) 09/15/2021 1554   APPEARANCEUR HAZY (A) 09/15/2021 1554   LABSPEC 1.020 09/15/2021 1554    PHURINE 6.0 09/15/2021 1554   GLUCOSEU NEGATIVE 09/15/2021 1554   HGBUR SMALL (A) 09/15/2021 1554   BILIRUBINUR NEGATIVE 09/15/2021 1554   KETONESUR NEGATIVE 09/15/2021 1554   PROTEINUR NEGATIVE 09/15/2021 1554   NITRITE NEGATIVE 09/15/2021 1554   LEUKOCYTESUR NEGATIVE 09/15/2021 1554   Sepsis Labs: _2 (procalcitonin:4,lacticidven:4)  Recent Results (from the past 240 hour(s))  Culture, blood (Routine X 2) w Reflex to ID Panel     Status: Abnormal   Collection Time: 09/27/21  5:19 AM   Specimen: BLOOD  Result Value Ref Range Status   Specimen Description   Final    BLOOD LEFT AC Performed at [facility], [address], [place], [place] [address]    Special Requests   Final    BOTTLES DRAWN AEROBIC ONLY Blood Culture results may not be optimal due to an inadequate volume of blood received in culture bottles Performed at [facility], [address] [place], [place] [address]    Culture  Setup Time   Final    GRAM POSITIVE COCCI AEROBIC BOTTLE ONLY Organism ID to follow CRITICAL RESULT CALLED TO, READ BACK BY AND VERIFIED WITH: ALEX CHAPPELL AT 8937 ON 09/28/21 BY SS Performed at [facility], [address] [place], [place] [address]    Culture (A)  Final    STAPHYLOCOCCUS EPIDERMIDIS THE SIGNIFICANCE OF ISOLATING THIS ORGANISM FROM A SINGLE SET OF BLOOD CULTURES WHEN MULTIPLE SETS ARE DRAWN IS UNCERTAIN. PLEASE NOTIFY THE MICROBIOLOGY DEPARTMENT WITHIN ONE WEEK IF SPECIATION AND SENSITIVITIES ARE REQUIRED. Performed at [facility], [place] [address] [place], [place] [address]    Report Status 09/30/2021 FINAL  Final  Blood Culture ID Panel (Reflexed)     Status: Abnormal   Collection Time: 09/27/21  5:19 AM  Result Value Ref Range Status   Enterococcus faecalis NOT DETECTED NOT DETECTED Final   Enterococcus Faecium NOT  DETECTED NOT DETECTED Final   Listeria monocytogenes NOT DETECTED NOT DETECTED Final   Staphylococcus  species DETECTED (A) NOT DETECTED Final    Comment: CRITICAL RESULT CALLED TO, READ BACK BY AND VERIFIED WITH: ALEX CHAPPELL AT 1540 ON 09/28/21 BY SS    Staphylococcus aureus (BCID) NOT DETECTED NOT DETECTED Final   Staphylococcus epidermidis DETECTED (A) NOT DETECTED Final    Comment: Methicillin (oxacillin) resistant coagulase negative staphylococcus. Possible blood culture contaminant (unless isolated from more than one blood culture draw or clinical case suggests pathogenicity). No antibiotic treatment is indicated for blood  culture contaminants. CRITICAL RESULT CALLED TO, READ BACK BY AND VERIFIED WITH: ALEX CHAPPELL AT 8250 ON 09/28/21 BY SS    Staphylococcus lugdunensis NOT DETECTED NOT DETECTED Final   Streptococcus species NOT DETECTED NOT DETECTED Final   Streptococcus agalactiae NOT DETECTED NOT DETECTED Final   Streptococcus pneumoniae NOT DETECTED NOT DETECTED Final   Streptococcus pyogenes NOT DETECTED NOT DETECTED Final   A.calcoaceticus-baumannii NOT DETECTED NOT DETECTED Final   Bacteroides fragilis NOT DETECTED NOT DETECTED Final   Enterobacterales NOT DETECTED NOT DETECTED Final   Enterobacter cloacae complex NOT DETECTED NOT DETECTED Final   Escherichia coli NOT DETECTED NOT DETECTED Final   Klebsiella aerogenes NOT DETECTED NOT DETECTED Final   Klebsiella oxytoca NOT DETECTED NOT DETECTED Final   Klebsiella pneumoniae NOT DETECTED NOT DETECTED Final   Proteus species NOT DETECTED NOT DETECTED Final   Salmonella species NOT DETECTED NOT DETECTED Final   Serratia marcescens NOT DETECTED NOT DETECTED Final   Haemophilus influenzae NOT DETECTED NOT DETECTED Final   Neisseria meningitidis NOT DETECTED NOT DETECTED Final   Pseudomonas aeruginosa NOT DETECTED NOT DETECTED Final   Stenotrophomonas maltophilia NOT DETECTED NOT DETECTED Final   Candida albicans NOT DETECTED NOT DETECTED Final   Candida auris NOT DETECTED NOT DETECTED Final   Candida glabrata NOT DETECTED  NOT DETECTED Final   Candida krusei NOT DETECTED NOT DETECTED Final   Candida parapsilosis NOT DETECTED NOT DETECTED Final   Candida tropicalis NOT DETECTED NOT DETECTED Final   Cryptococcus neoformans/gattii NOT DETECTED NOT DETECTED Final   Methicillin resistance mecA/C DETECTED (A) NOT DETECTED Final    Comment: CRITICAL RESULT CALLED TO, READ BACK BY AND VERIFIED WITH: ALEX CHAPPELL AT 0370 ON 09/28/21 BY SS Performed at [facility], [address] [place], [place] [address]   Culture, blood (Routine X 2) w Reflex to ID Panel     Status: None   Collection Time: 09/27/21  5:20 AM   Specimen: BLOOD  Result Value Ref Range Status   Specimen Description BLOOD LEFT HAND  Final   Special Requests   Final    BOTTLES DRAWN AEROBIC AND ANAEROBIC Blood Culture adequate volume   Culture   Final    NO GROWTH 5 DAYS Performed at [facility], [address] [place], [place] [address]    Report Status 10/02/2021 FINAL  Final  Aerobic/Anaerobic Culture w Gram Stain (surgical/deep wound)     Status: None   Collection Time: 09/28/21  1:16 PM   Specimen: PATH Other; Tissue  Result Value Ref Range Status   Specimen Description TISSUE  Final   Special Requests RIGHT ARM TISSUE  Final   Gram Stain   Final    NO SQUAMOUS EPITHELIAL CELLS SEEN FEW WBC SEEN NO ORGANISMS SEEN    Culture   Final    No growth aerobically or anaerobically. Performed at [facility], [address] [place], [place] [address]    Report Status 10/03/2021 FINAL  Final         Radiology Studies last 96 hours: ECHO TEE  Result Date: 10/04/2021    TRANSESOPHOGEAL ECHO REPORT   Patient Name:   Krystal Francis Date of Exam: 10/04/2021 Medical Rec #:  [id number]  Height:       64.0 in Accession #:    [id number] Weight:       339.5 lb Date of Birth:  1960/07/13 BSA:          2.450 m Patient Age:    72 years   BP:           101/53 mmHg Patient Gender: F          HR:           90 bpm. Exam Location:   [facility] Procedure: Transesophageal Echo, Color Doppler and Cardiac Doppler Indications:     Not listed on TEE check-in sheet  History:         Patient has prior history of Echocardiogram examinations, most                  recent 09/21/2021. Risk Factors:Hypertension. Anemia.  Sonographer:     Sherrie Sport Referring Phys:  [place] Diagnosing Phys: Serafina Royals MD PROCEDURE: The transesophogeal probe was passed without difficulty through the esophogus of the patient. Sedation performed by performing physician. The patient developed no complications during the procedure. IMPRESSIONS  1. Left ventricular ejection fraction, by estimation, is 60 to 65%. The left ventricle has normal function. The left ventricle has no regional wall motion abnormalities.  2. Right ventricular systolic function is normal. The right ventricular size is normal.  3. No left atrial/left atrial appendage thrombus was detected.  4. The mitral valve is normal in structure. Trivial mitral valve regurgitation.  5. The aortic valve is normal in structure. Aortic valve regurgitation is not visualized.  6. Agitated saline contrast bubble study was negative, with no evidence of any interatrial shunt. FINDINGS  Left Ventricle: Left ventricular ejection fraction, by estimation, is 60 to 65%. The left ventricle has normal function. The left ventricle has no regional wall motion abnormalities. The left ventricular internal cavity size was normal in size. Right Ventricle: The right ventricular size is normal. No increase in right ventricular wall thickness. Right ventricular systolic function is normal. Left Atrium: Left atrial size was normal in size. No left atrial/left atrial appendage thrombus was detected. Right Atrium: Right atrial size was normal in size. Pericardium: There is no evidence of pericardial effusion. Mitral Valve: The mitral valve is normal in structure. Trivial mitral valve regurgitation. There is no evidence of mitral valve  vegetation. Tricuspid Valve: The tricuspid valve is normal in structure. Tricuspid valve regurgitation is trivial. There is no evidence of tricuspid valve vegetation. Aortic Valve: The aortic valve is normal in structure. Aortic valve regurgitation is not visualized. There is no evidence of aortic valve vegetation. Pulmonic Valve: The pulmonic valve was normal in structure. Pulmonic valve regurgitation is trivial. There is no evidence of pulmonic valve vegetation. Aorta: The aortic root and ascending aorta are structurally normal, with no evidence of dilitation. There is minimal (Grade I) plaque. IAS/Shunts: The interatrial septum is aneurysmal. No atrial level shunt detected by color flow Doppler. Agitated saline contrast was given intravenously to evaluate for intracardiac shunting. Agitated saline contrast bubble study was negative, with no evidence of any interatrial shunt. Serafina Royals MD  Electronically signed by Serafina Royals MD Signature Date/Time: 10/04/2021/1:30:19 PM    Final             LOS: 19 days    Time spent: 28 min    Emeterio Reeve, DO [facility] 10/06/2021, 4:56 PM   Staff may message me via secure chat in [place]  but this may not receive immediate response,  please page for urgent matters!  If 7PM-7AM, please contact night-coverage [email or web address]  Dictation software was used to generate the above note. Typos may occur and escape review, as with typed/written notes. Please contact Dr Sheppard Coil directly for clarity if needed.

## 2021-10-07 DIAGNOSIS — B9562 Methicillin resistant Staphylococcus aureus infection as the cause of diseases classified elsewhere: Secondary | ICD-10-CM | POA: Diagnosis not present

## 2021-10-07 DIAGNOSIS — I82721 Chronic embolism and thrombosis of deep veins of right upper extremity: Secondary | ICD-10-CM | POA: Diagnosis not present

## 2021-10-07 DIAGNOSIS — J15212 Pneumonia due to Methicillin resistant Staphylococcus aureus: Secondary | ICD-10-CM | POA: Diagnosis not present

## 2021-10-07 DIAGNOSIS — R7881 Bacteremia: Secondary | ICD-10-CM | POA: Diagnosis not present

## 2021-10-07 LAB — CBC
HCT: 27.3 % — ABNORMAL LOW (ref 36.0–46.0)
Hemoglobin: 8.2 g/dL — ABNORMAL LOW (ref 12.0–15.0)
MCH: 23.1 pg — ABNORMAL LOW (ref 26.0–34.0)
MCHC: 30 g/dL (ref 30.0–36.0)
MCV: 76.9 fL — ABNORMAL LOW (ref 80.0–100.0)
Platelets: 280 10*3/uL (ref 150–400)
RBC: 3.55 MIL/uL — ABNORMAL LOW (ref 3.87–5.11)
RDW: 24.1 % — ABNORMAL HIGH (ref 11.5–15.5)
WBC: 4.3 10*3/uL (ref 4.0–10.5)
nRBC: 0 % (ref 0.0–0.2)

## 2021-10-07 NOTE — Treatment Plan (Signed)
Diagnosis: MRSA bacteremia and MRSA pneumonia Baseline Creatinine  < 1  Culture Result: MRSA  Allergies  Allergen Reactions   Furosemide Rash    Per [facility] records - rash possibly to IV furosemide but tablets ok   Chlorhexidine Hives, Dermatitis, Rash and Other (See Comments)    Blisters right after CHG wipe down    OPAT Orders Discharge antibiotics: Vancomycin 2 grams IV every 24 hours  Per pharmacy protocol  Aim for Vancomycin trough 15-17 (unless otherwise indicated) Duration: 6 weeks  End Date: 11/03/21  [facility] Care Per Protocol:  Labs weekly on Monday while on IV antibiotics: _X_ CBC with differential  _X_ CMP _ X_ Vancomycin trough   Labs weekly on Thursday While on antibiotics X Vancomycin Trough X  BMP  __ Please pull PIC at completion of IV antibiotics   Fax weekly labs to [phone]  Clinic Follow Up Appt: 10/25/21 at 9.30 [facility] video visit   Call [phone] with any questions or concerns

## 2021-10-07 NOTE — Progress Notes (Signed)
PROGRESS NOTE    Krystal Francis  NOB:[id number] DOB: 1960-09-20  DOA: 09/15/2021 Date of Service: 10/07/21 PCP: Krystal Maudlin, NP     Brief Narrative / Hospital Course:  Krystal Francis is a 61 y.o. female with medical history significant of RV failure, hypertension, GERD, PE on Lovenox, iron deficiency anemia, urinary retention with Foley catheter placement, uterine bleeding, morbid obesity with BMI 78.27, who presents with shortness of breath 09/17/2021. Patient had a significant tachycardia, tachypnea, leukocytosis and hypoxemia with oxygen saturation 87%, she met severe sepsis criteria. Her blood culture positive for MRSA, CT angiogram showed multifocal pneumonia with evidence of pulmonary hypertension. No PE noted but unable to r/o either. Extensive occlusive thrombus RUE notes 05/23 on [place], vascular consulted. Thrombectomy done 05/31. She was seen by ID, vancomycin was started to continue for min 6 weeks. TEE 06/06 no vegetations, cardiology s/o. Respiratory stable 06/08 pulmonary s/o. 06/08 dispo continued to be an issue - pt refusing PT and refusing SNF, requesting home w/ HH to manage IV abx and different hospital bed, TOC following. Attempted to transfer to [facility] since she will need to be there anyway for hysterectomy - they have declined transfer 06/09. Patient working on arranging acceptable bed at home, states would be open to SNF but she is declining to work w/ PT here, trying to find assistance w/ IV abx at home [facility] to come to educate her.   Consultants:  Infectious Disease Vascular Surgery  Cardiology PCCU  Procedures: 09/28/21: Thrombectomy RUE 10/04/21: Transesophageal Echocardiogram     Subjective: Patient states no complaints today other than would like to go home when possible or ideally to [facility] for hysterectomy. SHe states she's found a bed for home use. She reiterates she cannot get out of bed and back INTO it which is why she's been refusing PT here.    ASSESSMENT &  PLAN:   Principal Problem:   Multifocal pneumonia Active Problems:   Severe sepsis ([place])   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HCC)   Pulmonary embolism (HCC)   HTN (hypertension)   Urinary retention   Iron deficiency anemia   Hematuria   Morbid obesity with body mass index of 70 and over in adult [facility])   MRSA bacteremia   Hypomagnesemia   Acute hypoxemic respiratory failure (HCC)   Arterial hypotension   Palliative care by specialist   Goals of care, counseling/discussion   Postmenopausal bleeding   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[place]) Patient has morbid obesity, very difficult to assess volume status.   2D echo on 08/16/21: showed normal left ventricular systolic function with mild LVH, moderate RV systolic dysfunction. Will continue torsemide 20 mg daily 06/06 TEE no cocnerns, cardiology has signed off    Hematuria Urinalysis negative. Overall suspecting that the Foley balloon may have been in the urethra causing some local bleeding,  no evidence of ongoing hematuria. Pt also has intermittent vaginal bleeding. Monitor Hbg  Multifocal pneumonia multifocal pneumonia and sepsis due to pneumonia: CTA is limited study, did not show central PE, showed multifocal infiltration.  Patient had leukocytosis with WBC 16.7, indicating possible multifocal pneumonia.  Patient met criteria for sepsis with WBC 16.7, heart rate 114, 26.  Lactic acid normal 0.9, 1.4. IV Rocephin and azithromycin (pt received one dose of IV Vancomycin and cefepime in ED) --> vancomycin per ID Given MRSA bacteremia will need IV abx x6 weeks total  Severe sepsis (HCC) Resolved   Pulmonary embolism ([place]) Patient is on Lovenox, hx PE and recent DVT RUE s/p  thrombectomy Plan to transition to Xarelto given BMI   HTN (hypertension) IV hydralazine as needed Patient is on torsemide  Iron deficiency anemia Hemoglobin 8.6 on 09/15/2021, 7.7 today.  Hemoglobin drop is likely due to vaginal bleeding which is chronic.  She also had hematuria  earlier this admission which has resolved. Continue iron supplement uterine bleeding and need for anticoagulation are also contributing  May need transfer to [facility] for OBGYN to eval for hysterectomy if Hgb dropping but has been stable for now  Morbid obesity with body mass index of 70 and over in adult [facility]) BMI=78.27   and BW= 206.8 Diet and exercise.   Encouraged to lose weight. BMI and associated ambulatory dysfunction precludes safe placement home until equipment can be arranged   Postmenopausal bleeding Ongoing for many months. Seen here in March and transferred to [facility] where she underwent hysteroscopy, vaginoscopy and endometrial biopsy.  I am not able to find biopsy results in Care Everywhere.  Pt reports being told it was negative. She was noted to have a submucosal fibroid, bleeding has been attributed to that.  Also has thickened endometrium. On Provera BID --> increased to TID F/U with Gyn at [facility] as scheduled previous hospitalist per report has spoken w/ OBGYN and was adivsed can increase provera to tid (done) but any surgical intervention would need to be done at tertiary care center d/t weight  Needing permanent anticoagulation d/t recent DVT and Hx PE  Palliative care by specialist .  Hypomagnesemia Mg 1.5 on 6/5, given 2 g IV Mg-sulfate. Monitor & replace Mg PRN  Arterial hypotension Resolved.  MRSA bacteremia TEE was done 06/06 no vegetations On IV vancomycin through at least 11/03/2021 per ID    DVT prophylaxis: Lovenox Code Status: FULL Family Communication: none Disposition Plan / TOC needs: home equipment and IV Abx need to be ensured, transfer to [place] attempted and declined  Barriers to discharge / significant pending items: see above, TOC following              Objective: Vitals:   10/06/21 2216 10/07/21 0549 10/07/21 0920 10/07/21 1205  BP: (!) 113/54 (!) 114/59 117/68 (!) 109/59  Pulse: 79 87 89 86  Resp:  18 16 16   Temp: 98.1 F (36.7  C) 97.8 F (36.6 C) 98.4 F (36.9 C)   TempSrc: Oral  Oral   SpO2: 96% 95% 96% 96%  Weight:      Height:        Intake/Output Summary (Last 24 hours) at 10/07/2021 1508 Last data filed at 10/06/2021 1634 Gross per 24 hour  Intake --  Output 900 ml  Net -900 ml    Filed Weights   10/02/21 0500 10/05/21 0433 10/06/21 0500  Weight: (!) 154 kg (!) 240 kg (!) 234.5 kg    Examination:  Constitutional:  VS as above General Appearance: alert, well-developed, well-nourished, NAD Eyes: Normal lids and conjunctive, non-icteric sclera Ears, Nose, Mouth, Throat: Normal appearance Neck: No masses, trachea midline Respiratory: Normal respiratory effort Breath sounds normal, no wheeze/rhonchi/rales Cardiovascular: S1/S2 normal, no murmur/rub/gallop auscultated No lower extremity edema Gastrointestinal: Nontender, no masses Habitus limits exam  Musculoskeletal:  No clubbing/cyanosis of digits Neurological: No cranial nerve deficit on limited exam Motor and sensation intact and symmetric Psychiatric: Normal judgment/insight Normal mood and affect       Scheduled Medications:   enoxaparin (LOVENOX) injection  120 mg Subcutaneous Q12H   iron polysaccharides  150 mg Oral Daily   magnesium oxide  400  mg Oral Daily   medroxyPROGESTERone  10 mg Oral TID   multivitamin with minerals  1 tablet Oral Daily   pantoprazole  40 mg Oral Daily   senna-docusate  2 tablet Oral BID    Continuous Infusions:  sodium chloride 10 mL/hr at 09/28/21 1136   vancomycin 2,000 mg (10/06/21 2214)    PRN Medications:  acetaminophen, albuterol, diphenhydrAMINE, docusate sodium, guaiFENesin-dextromethorphan, hydrALAZINE, HYDROmorphone (DILAUDID) injection, magnesium hydroxide, ondansetron **OR** ondansetron (ZOFRAN) IV, polyethylene glycol, traMADol, traZODone, triamcinolone cream  Antimicrobials:  Anti-infectives (From admission, onward)    Start     Dose/Rate Route Frequency Ordered Stop    09/29/21 2100  vancomycin (VANCOREADY) IVPB 2000 mg/400 mL        2,000 mg 200 mL/hr over 120 Minutes Intravenous Every 24 hours 09/29/21 1211     09/28/21 2000  vancomycin (VANCOCIN) 2,000 mg in sodium chloride 0.9 % 500 mL IVPB  Status:  Discontinued        2,000 mg 260 mL/hr over 120 Minutes Intravenous Every 24 hours 09/28/21 1522 09/29/21 1211   09/28/21 2000  vancomycin (VANCOREADY) IVPB 2000 mg/400 mL  Status:  Discontinued        2,000 mg 200 mL/hr over 120 Minutes Intravenous Every 24 hours 09/28/21 1543 09/28/21 1545   09/26/21 2000  vancomycin (VANCOREADY) IVPB 2000 mg/400 mL  Status:  Discontinued        2,000 mg 200 mL/hr over 120 Minutes Intravenous Every 24 hours 09/26/21 1451 09/28/21 1522   09/20/21 1800  vancomycin (VANCOREADY) IVPB 1750 mg/350 mL  Status:  Discontinued        1,750 mg 175 mL/hr over 120 Minutes Intravenous Every 24 hours 09/20/21 0616 09/26/21 1451   09/20/21 0600  vancomycin (VANCOCIN) 1,500 mg in sodium chloride 0.9 % 500 mL IVPB  Status:  Discontinued        1,500 mg 250 mL/hr over 120 Minutes Intravenous Every 12 hours 09/20/21 0102 09/20/21 0615   09/17/21 1900  vancomycin (VANCOREADY) IVPB 1500 mg/300 mL  Status:  Discontinued        1,500 mg 150 mL/hr over 120 Minutes Intravenous Every 12 hours 09/17/21 1857 09/20/21 0101   09/17/21 1200  cefTRIAXone (ROCEPHIN) 2 g in sodium chloride 0.9 % 100 mL IVPB  Status:  Discontinued        2 g 200 mL/hr over 30 Minutes Intravenous Every 24 hours 09/17/21 0622 09/17/21 1844   09/17/21 1000  azithromycin (ZITHROMAX) 500 mg in sodium chloride 0.9 % 250 mL IVPB  Status:  Discontinued        500 mg 250 mL/hr over 60 Minutes Intravenous Every 24 hours 09/17/21 0622 09/18/21 1247   09/17/21 0445  ceFEPIme (MAXIPIME) 1 g in sodium chloride 0.9 % 100 mL IVPB        1 g 200 mL/hr over 30 Minutes Intravenous  Once 09/17/21 0441 09/18/21 0743   09/17/21 0445  vancomycin (VANCOREADY) IVPB 2000 mg/400 mL         2,000 mg 200 mL/hr over 120 Minutes Intravenous  Once 09/17/21 0441 09/18/21 1437       Data Reviewed: I have personally reviewed following labs and imaging studies  CBC: Recent Labs  Lab 10/01/21 0138 10/02/21 1300 10/03/21 0406 10/05/21 0528 10/06/21 0719 10/07/21 0637  WBC 7.9  --  7.6 5.0 4.3 4.3  NEUTROABS 5.2  --   --   --   --   --   HGB 8.0* 8.1* 8.5* 8.3*  8.1* 8.2*  HCT 26.9* 27.5* 28.9* 28.3* 28.0* 27.3*  MCV 75.8*  --  77.1* 77.5* 78.2* 76.9*  PLT 262  --  277 281 283 144    Basic Metabolic Panel: Recent Labs  Lab 10/03/21 0406 10/05/21 0528 10/06/21 0719  NA 138 136 137  K 3.9 3.7 4.0  CL 108 105 107  CO2 25 24 26   GLUCOSE 95 99 89  BUN 9 8 8   CREATININE 0.39* 0.42* 0.44  CALCIUM 8.5* 8.6* 8.8*  MG 1.5* 1.6*  --     GFR: Estimated Creatinine Clearance: 151.3 mL/min (by C-G formula based on SCr of 0.44 mg/dL). Liver Function Tests: No results for input(s): "AST", "ALT", "ALKPHOS", "BILITOT", "PROT", "ALBUMIN" in the last 168 hours. No results for input(s): "LIPASE", "AMYLASE" in the last 168 hours. No results for input(s): "AMMONIA" in the last 168 hours. Coagulation Profile: No results for input(s): "INR", "PROTIME" in the last 168 hours. Cardiac Enzymes: No results for input(s): "CKTOTAL", "CKMB", "CKMBINDEX", "TROPONINI" in the last 168 hours. BNP (last 3 results) No results for input(s): "PROBNP" in the last 8760 hours. HbA1C: No results for input(s): "HGBA1C" in the last 72 hours. CBG: Recent Labs  Lab 10/02/21 2103  GLUCAP 95    Lipid Profile: No results for input(s): "CHOL", "HDL", "LDLCALC", "TRIG", "CHOLHDL", "LDLDIRECT" in the last 72 hours. Thyroid Function Tests: No results for input(s): "TSH", "T4TOTAL", "FREET4", "T3FREE", "THYROIDAB" in the last 72 hours. Anemia Panel: No results for input(s): "VITAMINB12", "FOLATE", "FERRITIN", "TIBC", "IRON", "RETICCTPCT" in the last 72 hours. Urine analysis:    Component Value  Date/Time   COLORURINE AMBER (A) 09/15/2021 1554   APPEARANCEUR HAZY (A) 09/15/2021 1554   LABSPEC 1.020 09/15/2021 1554   PHURINE 6.0 09/15/2021 1554   GLUCOSEU NEGATIVE 09/15/2021 1554   HGBUR SMALL (A) 09/15/2021 1554   BILIRUBINUR NEGATIVE 09/15/2021 1554   KETONESUR NEGATIVE 09/15/2021 1554   PROTEINUR NEGATIVE 09/15/2021 1554   NITRITE NEGATIVE 09/15/2021 1554   LEUKOCYTESUR NEGATIVE 09/15/2021 1554   Sepsis Labs: @LABRCNTIP (procalcitonin:4,lacticidven:4)  Recent Results (from the past 240 hour(s))  Aerobic/Anaerobic Culture w Gram Stain (surgical/deep wound)     Status: None   Collection Time: 09/28/21  1:16 PM   Specimen: PATH Other; Tissue  Result Value Ref Range Status   Specimen Description TISSUE  Final   Special Requests RIGHT ARM TISSUE  Final   Gram Stain   Final    NO SQUAMOUS EPITHELIAL CELLS SEEN FEW WBC SEEN NO ORGANISMS SEEN    Culture   Final    No growth aerobically or anaerobically. Performed at [facility], [place] [address] [place], [place] [address]    Report Status 10/03/2021 FINAL  Final         Radiology Studies last 96 hours: ECHO TEE  Result Date: 10/04/2021    TRANSESOPHOGEAL ECHO REPORT   Patient Name:   Krystal Francis Date of Exam: 10/04/2021 Medical Rec #:  [id number]  Height:       64.0 in Accession #:    [id number] Weight:       339.5 lb Date of Birth:  [birth year] BSA:          2.450 m Patient Age:    14 years   BP:           101/53 mmHg Patient Gender: F          HR:           90 bpm. Exam Location:  [facility] Procedure: Transesophageal Echo, Color Doppler and Cardiac Doppler Indications:     Not listed on TEE check-in sheet  History:         Patient has prior history of Echocardiogram examinations, most                  recent 09/21/2021. Risk Factors:Hypertension. Anemia.  Sonographer:     Sherrie Sport Referring Phys:  [place] Diagnosing Phys: Serafina Royals MD PROCEDURE: The transesophogeal probe was passed without difficulty  through the esophogus of the patient. Sedation performed by performing physician. The patient developed no complications during the procedure. IMPRESSIONS  1. Left ventricular ejection fraction, by estimation, is 60 to 65%. The left ventricle has normal function. The left ventricle has no regional wall motion abnormalities.  2. Right ventricular systolic function is normal. The right ventricular size is normal.  3. No left atrial/left atrial appendage thrombus was detected.  4. The mitral valve is normal in structure. Trivial mitral valve regurgitation.  5. The aortic valve is normal in structure. Aortic valve regurgitation is not visualized.  6. Agitated saline contrast bubble study was negative, with no evidence of any interatrial shunt. FINDINGS  Left Ventricle: Left ventricular ejection fraction, by estimation, is 60 to 65%. The left ventricle has normal function. The left ventricle has no regional wall motion abnormalities. The left ventricular internal cavity size was normal in size. Right Ventricle: The right ventricular size is normal. No increase in right ventricular wall thickness. Right ventricular systolic function is normal. Left Atrium: Left atrial size was normal in size. No left atrial/left atrial appendage thrombus was detected. Right Atrium: Right atrial size was normal in size. Pericardium: There is no evidence of pericardial effusion. Mitral Valve: The mitral valve is normal in structure. Trivial mitral valve regurgitation. There is no evidence of mitral valve vegetation. Tricuspid Valve: The tricuspid valve is normal in structure. Tricuspid valve regurgitation is trivial. There is no evidence of tricuspid valve vegetation. Aortic Valve: The aortic valve is normal in structure. Aortic valve regurgitation is not visualized. There is no evidence of aortic valve vegetation. Pulmonic Valve: The pulmonic valve was normal in structure. Pulmonic valve regurgitation is trivial. There is no evidence of  pulmonic valve vegetation. Aorta: The aortic root and ascending aorta are structurally normal, with no evidence of dilitation. There is minimal (Grade I) plaque. IAS/Shunts: The interatrial septum is aneurysmal. No atrial level shunt detected by color flow Doppler. Agitated saline contrast was given intravenously to evaluate for intracardiac shunting. Agitated saline contrast bubble study was negative, with no evidence of any interatrial shunt. Serafina Royals MD Electronically signed by Serafina Royals MD Signature Date/Time: 10/04/2021/1:30:19 PM    Final             LOS: 20 days    Time spent: 65 min    Emeterio Reeve, DO [facility] 10/07/2021, 3:08 PM   Staff may message me via secure chat in [place]  but this may not receive immediate response,  please page for urgent matters!  If 7PM-7AM, please contact night-coverage [email or web address]  Dictation software was used to generate the above note. Typos may occur and escape review, as with typed/written notes. Please contact Dr Sheppard Coil directly for clarity if needed.

## 2021-10-07 NOTE — Consult Note (Signed)
Pharmacy Antibiotic Note  Krystal Francis is a 61 y.o. female w/ h/o RV failure, HTN, GERD, PE PTA on Lovenox, iron deficiency anemia, urinary retention with Foley catheter placement, uterine bleeding, morbid obesity with BMI 78.27, who presents with shortness of breath > w/u c/w PE & multifocal PNA. Pt admitted on 09/15/2021 with pneumonia and bacteremia & BCID resulted with MRSA 4 of 4 vials on 5/20.  Pharmacy has been consulted for escalation of abx to Vancomycin dosing.   -planning 6 weeks IV Vancomycin per ID thru 11/03/21  Day # 21 vancomycin MRSA bacteremia Day # 10 from first blood culture with No growth Renal: SCr stable Repeat blood cxs 5/25: 1 still with Staph aureus Repeat blood cx 5/30 with 1/4 GPC  but BCID detected MRSE (contaminant) 6/6 TEE: neg for endocarditis  6/7: vancomycin trough 12 mcg/mL at 2138 =  on vancomycin 2gm IV q24h  Previous Vancomycin levels: 6/1 vancomycin 2gm IV q24h given at 22:19 6/2 vanco peak = 26 mcg/ml at  02:02 6/2 random vancomycin level = 12.0 at 18:29 True Cmax = 28.2, Cmin = 10 and AUC = 425    Plan:    Continue vancomycin  to 2000 mg IV every 24 hours. Follow renal function  Follow 5/30 blood cx Await ability to place PICC once Blood cx remain negative Await OPAT and discharge disposition, patient unable to give IV antibiotic to self at home   Height: '5\' 4"'$  (162.6 cm) Weight: (!) 234.5 kg (516 lb 15.7 oz) IBW/kg (Calculated) : 54.7  Temp (24hrs), Avg:98.1 F (36.7 C), Min:97.8 F (36.6 C), Max:98.4 F (36.9 C)  Recent Labs  Lab 09/30/21 1829 10/01/21 0138 10/03/21 0406 10/05/21 0528 10/05/21 2138 10/06/21 0719 10/07/21 0637  WBC  --  7.9 7.6 5.0  --  4.3 4.3  CREATININE  --   --  0.39* 0.42*  --  0.44  --   VANCOTROUGH  --   --   --   --  12*  --   --   VANCORANDOM 12  --   --   --   --   --   --      Estimated Creatinine Clearance: 151.3 mL/min (by C-G formula based on SCr of 0.44 mg/dL).    Allergies  Allergen  Reactions   Furosemide Rash    Per [facility] records - rash possibly to IV furosemide but tablets ok   Chlorhexidine Hives, Dermatitis, Rash and Other (See Comments)    Blisters right after CHG wipe down    Antimicrobials this admission: Azithro (5/20-5/21) CRO x1 in ED (5/20) [facility] 5/20 >>   Dose adjustments this admission: 5/22: Vancomycin levels  on 1gm IV q12h (with 5th maintenance dose Vancomycin peak 5/22 at 2141 = 39 mcg/mL Vancomycin trough 5/23 at 0504 = 30 mcg/mL Vanc adj to '1750mg'$  IV q24h  5/24 Vanc random'@2126'$ = 15 mcg/ml   No chg  5/27 Vanc '1750mg'$  hung'@2222'$  -infuse over 2 hrs         5/28   Vanc P'@0023'$ =38  mcg/ml         5/28   Vanc T'@2044'$ =10 mcg/ml        -Vanc adj to 2000 mg IV q24h  6/2 vanco peak = 26 mcg/ml at  02:02 And 6/2 random vancomycin level = 12.0 at 18:29  6/7 Vanc trough '@2138'$ = 12 mcg/mL,  continue Vancomycin '2000mg'$  IV q24h   Microbiology results: 5/20 BCx: BCID 4 of 4 vials MRSA 5/22 BCx: S aureus, MRSA  5/25: Bcx: L hand 2150: NG          Bcx: L hand 2204: +Staph aureus Repeat blood cx 5/30 with 1/4 GPC  but BCID detected MRSE (contaminant)  5/20 Sputum: MRSA 5/20 Flu/Cov PCR: negative    Thank you for allowing pharmacy to be a part of this patient's care.  Chinita Greenland PharmD Clinical Pharmacist 10/07/2021

## 2021-10-07 NOTE — Care Management Important Message (Signed)
Important Message  Patient Details  Name: Krystal Francis MRN: [id number] Date of Birth: 10-16-1960   Medicare Important Message Given:  Other (see comment)  Patient is in an isolation room so I called her room [phone]) to review the Important Message from Medicare with her again by phone but there was no answer.  Will try again.   Juliann Pulse A Zak Gondek 10/07/2021, 2:13 PM

## 2021-10-07 NOTE — TOC Progression Note (Addendum)
Transition of Care [facility]) - Progression Note    Patient Details  Name: Krystal Francis MRN: [id number] Date of Birth: 08/08/1960  Transition of Care [facility]) CM/SW Malcolm, RN Phone Number: 10/07/2021, 1:47 PM  Clinical Narrative:   Patient was a consideration to be transferred to [facility], however [facility] declined the transfer.  Patient still wanting to go home.  [facility] in process of ordering bed, but still unable to provide stepstool. [facility] to accept patient for services.  Carolynn Sayers with [employer] aware of IV antibiotics, will discuss traning with patient today or tomorrow.  Addendum 1354:  Patient states she does not approve of the bed ordered by [facility] at this time, she states the  bed needs to go lower.  As per Thedore Mins at [facility] the appropriate bed does not go lower than 35inches.  Patient also states she is trying to get someone to assist her  with IV antibiotics, as she is not sure who would do that at home.  She states she wanted to find a SNF, but continues to refuse working with therapy.  Patient then abruptly ended the call stating she just wants to find someone to assist with IV antibiotics and does not wish to speak with me.    Expected Discharge Plan: Skilled Nursing Facility Barriers to Discharge: Continued Medical Work up, SNF Pending bed offer, Ship broker  Expected Discharge Plan and Services Expected Discharge Plan: [place]                                               Social Determinants of Health (SDOH) Interventions    Readmission Risk Interventions     No data to display

## 2021-10-07 NOTE — Progress Notes (Addendum)
Nutrition Follow-up  DOCUMENTATION CODES:   Morbid obesity  INTERVENTION:   -D/c Ensure Enlive po BID, each supplement provides 350 kcal and 20 grams of protein -Continue MVI with minerals daily -Double protein portions with meals  NUTRITION DIAGNOSIS:   Increased nutrient needs related to acute illness as evidenced by estimated needs.  Ongoing  GOAL:   Patient will meet greater than or equal to 90% of their needs  Progressing   MONITOR:   PO intake, Supplement acceptance  REASON FOR ASSESSMENT:   Malnutrition Screening Tool    ASSESSMENT:   Pt with medical history significant of RV failure, hypertension, GERD, PE on Lovenox, iron deficiency anemia, urinary retention with Foley catheter placement, uterine bleeding, morbid obesity with BMI 78.27, who presents with shortness of breath.  6/6- s/p TEE- no endocarditis  Reviewed I/O's: -900 ml x 24 hours and -12.1 L since 09/23/21  UOP: 900 ml x 24 hours   Pt unavailable at time of visit. Attempted to speak with pt via call to hospital room phone, however, unable to reach.   Per ID notes, plan for 6 weeks of IV vancomycin for MRSA bacteremia. Pt refusing SNF placement and is adamant to go home.   Pt consuming 25-100% of meals. She is refusing Ensure supplements, as well as therapy.   Medications reviewed and include magnesium oxide and senokot.   Labs reviewed.   Diet Order:   Diet Order             Diet Carb Modified Fluid consistency: Thin; Room service appropriate? Yes  Diet effective now                   EDUCATION NEEDS:   No education needs have been identified at this time  Skin:  Skin Assessment: Skin Integrity Issues: Skin Integrity Issues:: Other (Comment) Other: non-pressure wound L buttock  Last BM:  10/06/21 (type 5)  Height:   Ht Readings from Last 1 Encounters:  09/17/21 '5\' 4"'$  (1.626 m)    Weight:   Wt Readings from Last 1 Encounters:  10/06/21 (!) 234.5 kg    Ideal Body  Weight:  54.5 kg  BMI:  Body mass index is 88.74 kg/m.  Estimated Nutritional Needs:   Kcal:  1900-2100  Protein:  120-135 grams  Fluid:  > 1.9 L    Loistine Chance, RD, LDN, [place] Registered Dietitian II Certified Diabetes Care and Education Specialist Please refer to [facility] for RD and/or RD on-call/weekend/after hours pager

## 2021-10-08 DIAGNOSIS — I82721 Chronic embolism and thrombosis of deep veins of right upper extremity: Secondary | ICD-10-CM | POA: Diagnosis not present

## 2021-10-08 DIAGNOSIS — B9562 Methicillin resistant Staphylococcus aureus infection as the cause of diseases classified elsewhere: Secondary | ICD-10-CM | POA: Diagnosis not present

## 2021-10-08 DIAGNOSIS — J15212 Pneumonia due to Methicillin resistant Staphylococcus aureus: Secondary | ICD-10-CM | POA: Diagnosis not present

## 2021-10-08 DIAGNOSIS — R7881 Bacteremia: Secondary | ICD-10-CM | POA: Diagnosis not present

## 2021-10-08 NOTE — Progress Notes (Signed)
PROGRESS NOTE    Krystal Francis  JKD:[id number] DOB: Sep 11, 1960  DOA: 09/15/2021 Date of Service: 10/08/21 PCP: Latanya Maudlin, NP     Brief Narrative / Hospital Course:  Krystal Francis is a 61 y.o. female with medical history significant of RV failure, hypertension, GERD, PE on Lovenox, iron deficiency anemia, urinary retention with Foley catheter placement, uterine bleeding, morbid obesity with BMI 78.27, who presents with shortness of breath 09/17/2021. Patient had a significant tachycardia, tachypnea, leukocytosis and hypoxemia with oxygen saturation 87%, she met severe sepsis criteria. Her blood culture positive for MRSA, CT angiogram showed multifocal pneumonia with evidence of pulmonary hypertension. No PE noted but unable to r/o either. Extensive occlusive thrombus RUE notes 05/23 on [place], vascular consulted. Thrombectomy done 05/31. She was seen by ID, vancomycin was started to continue for min 6 weeks. TEE 06/06 no vegetations, cardiology s/o. Respiratory stable 06/08 pulmonary s/o. 06/08 dispo continued to be an issue - pt refusing PT and refusing SNF, requesting home w/ HH to manage IV abx and different hospital bed, TOC following. Attempted to transfer to [place] since she will need to be there anyway for hysterectomy - they have declined transfer 06/09. Patient working on arranging acceptable bed at home, states would be open to SNF but she is declining to work w/ PT here, trying to find assistance w/ IV abx at home [facility] to come to educate her.   Consultants:  Infectious Disease Vascular Surgery  Cardiology PCCU  Procedures: 09/28/21: Thrombectomy RUE 10/04/21: Transesophageal Echocardiogram     Subjective: Patient states no complaints today other than would like to go home when possible or ideally to [facility] for hysterectomy. SHe states she's found a bed for home use. She reiterates she cannot get out of bed and back INTO it which is why she's been refusing PT here.    ASSESSMENT &  PLAN:   Principal Problem:   Multifocal pneumonia Active Problems:   Severe sepsis ([place])   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HCC)   Pulmonary embolism (HCC)   HTN (hypertension)   Urinary retention   Iron deficiency anemia   Hematuria   Morbid obesity with body mass index of 70 and over in adult [facility])   MRSA bacteremia   Hypomagnesemia   Acute hypoxemic respiratory failure (HCC)   Arterial hypotension   Palliative care by specialist   Goals of care, counseling/discussion   Postmenopausal bleeding   Multifocal pneumonia MRSA Bacteremia multifocal pneumonia and sepsis due to pneumonia: CTA is limited study, did not show central PE, showed multifocal infiltration.  Patient had leukocytosis with WBC 16.7, indicating possible multifocal pneumonia.  Patient met criteria for sepsis with WBC 16.7, heart rate 114, 26.  Lactic acid normal 0.9, 1.4. IV Rocephin and azithromycin (pt received one dose of IV Vancomycin and cefepime in ED) --> vancomycin per ID Given MRSA bacteremia will need IV abx x6 weeks total per ID Discharge antibiotics: Vancomycin 2 grams IV every 24 hours per pharmacy protocol, aim for Vancomycin trough 15-17 (unless otherwise indicated). Duration: 6 weeks, End Date: 11/03/21 [facility] Care Per Protocol. Labs weekly on Monday while on IV antibiotics: CBC with differential, CMP, Vancomycin trough. Labs weekly on Thursday While on antibiotics: Vancomycin Trough, BMP. Please pull PIC at completion of IV antibiotics. Fax weekly labs to [phone]. Clinic Follow Up Appt: 10/25/21 at 9.30 [facility] video visit  Severe sepsis [facility]) Resolved   Pulmonary embolism ([place]) Patient is on Lovenox, hx PE and recent DVT RUE s/p thrombectomy Plan  to transition to Xarelto given BMI   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[place]) Patient has morbid obesity, very difficult to assess volume status.   2D echo on 08/16/21: showed normal left ventricular systolic function with mild LVH, moderate RV systolic dysfunction. Will  continue torsemide 20 mg daily 06/06 TEE no cocnerns, cardiology has signed off   Hematuria Iron deficiency anemia Postmenopausal bleeding Pt also has intermittent vaginal bleeding. Monitor Hbg Continue iron supplement uterine bleeding and need for anticoagulation are also contributing. On Provera BID --> increased to TID May need transfer to [facility] for OBGYN to eval for hysterectomy if Hgb dropping but has been stable for now - [facility] has declined transfer as of 10/06/2021. F/U with Gyn at [facility] as scheduled Needing permanent anticoagulation d/t recent DVT and Hx PE  HTN (hypertension) IV hydralazine as needed Patient is on torsemide  Morbid obesity with body mass index of 70 and over in adult [facility]) BMI=78.27   and BW= 206.8 Diet and exercise.   Encouraged to lose weight. BMI and associated ambulatory dysfunction precludes safe placement home until equipment can be arranged  Palliative care by specialist See notes  Hypomagnesemia Mg 1.5 on 6/5, given 2 g IV Mg-sulfate. Monitor & replace Mg PRN  Arterial hypotension Resolved.      DVT prophylaxis: Lovenox Code Status: FULL Family Communication: none Disposition Plan / TOC needs: home equipment and IV Abx need to be ensured, transfer to [place] attempted and declined  Barriers to discharge / significant pending items: see above, TOC following              Objective: Vitals:   10/08/21 0523 10/08/21 0733 10/08/21 1234 10/08/21 1547  BP: 125/69 105/62 130/71 122/63  Pulse: 85 94 99 83  Resp: _0 Temp: 98.5 F (36.9 C) 98.1 F (36.7 C) 98 F (36.7 C) 97.7 F (36.5 C)  TempSrc:      SpO2: 97% 97% 97% 97%  Weight:      Height:        Intake/Output Summary (Last 24 hours) at 10/08/2021 1636 Last data filed at 10/08/2021 0500 Gross per 24 hour  Intake --  Output 500 ml  Net -500 ml    Filed Weights   10/05/21 0433 10/06/21 0500 10/08/21 0500  Weight: (!) 240 kg (!) 234.5 kg (!) 231.8 kg     Examination:  Constitutional:  VS as above General Appearance: alert, well-developed, well-nourished, NAD Eyes: Normal lids and conjunctive, non-icteric sclera Ears, Nose, Mouth, Throat: Normal appearance Neck: No masses, trachea midline Respiratory: Normal respiratory effort Breath sounds normal, no wheeze/rhonchi/rales Cardiovascular: S1/S2 normal, no murmur/rub/gallop auscultated No lower extremity edema Gastrointestinal: Nontender, no masses Habitus limits exam  Musculoskeletal:  No clubbing/cyanosis of digits Neurological: No cranial nerve deficit on limited exam Motor and sensation intact and symmetric Psychiatric: Normal judgment/insight Normal mood and affect       Scheduled Medications:   enoxaparin (LOVENOX) injection  120 mg Subcutaneous Q12H   iron polysaccharides  150 mg Oral Daily   magnesium oxide  400 mg Oral Daily   medroxyPROGESTERone  10 mg Oral TID   multivitamin with minerals  1 tablet Oral Daily   pantoprazole  40 mg Oral Daily   senna-docusate  2 tablet Oral BID    Continuous Infusions:  sodium chloride 10 mL/hr at 09/28/21 1136   vancomycin Stopped (10/08/21 1031)    PRN Medications:  acetaminophen, albuterol, diphenhydrAMINE, docusate sodium, guaiFENesin-dextromethorphan, hydrALAZINE, HYDROmorphone (DILAUDID) injection, magnesium hydroxide, ondansetron **  OR** ondansetron (ZOFRAN) IV, polyethylene glycol, traMADol, traZODone, triamcinolone cream  Antimicrobials:  Anti-infectives (From admission, onward)    Start     Dose/Rate Route Frequency Ordered Stop   09/29/21 2100  vancomycin (VANCOREADY) IVPB 2000 mg/400 mL        2,000 mg 200 mL/hr over 120 Minutes Intravenous Every 24 hours 09/29/21 1211     09/28/21 2000  vancomycin (VANCOCIN) 2,000 mg in sodium chloride 0.9 % 500 mL IVPB  Status:  Discontinued        2,000 mg 260 mL/hr over 120 Minutes Intravenous Every 24 hours 09/28/21 1522 09/29/21 1211   09/28/21 2000   vancomycin (VANCOREADY) IVPB 2000 mg/400 mL  Status:  Discontinued        2,000 mg 200 mL/hr over 120 Minutes Intravenous Every 24 hours 09/28/21 1543 09/28/21 1545   09/26/21 2000  vancomycin (VANCOREADY) IVPB 2000 mg/400 mL  Status:  Discontinued        2,000 mg 200 mL/hr over 120 Minutes Intravenous Every 24 hours 09/26/21 1451 09/28/21 1522   09/20/21 1800  vancomycin (VANCOREADY) IVPB 1750 mg/350 mL  Status:  Discontinued        1,750 mg 175 mL/hr over 120 Minutes Intravenous Every 24 hours 09/20/21 0616 09/26/21 1451   09/20/21 0600  vancomycin (VANCOCIN) 1,500 mg in sodium chloride 0.9 % 500 mL IVPB  Status:  Discontinued        1,500 mg 250 mL/hr over 120 Minutes Intravenous Every 12 hours 09/20/21 0102 09/20/21 0615   09/17/21 1900  vancomycin (VANCOREADY) IVPB 1500 mg/300 mL  Status:  Discontinued        1,500 mg 150 mL/hr over 120 Minutes Intravenous Every 12 hours 09/17/21 1857 09/20/21 0101   09/17/21 1200  cefTRIAXone (ROCEPHIN) 2 g in sodium chloride 0.9 % 100 mL IVPB  Status:  Discontinued        2 g 200 mL/hr over 30 Minutes Intravenous Every 24 hours 09/17/21 0622 09/17/21 1844   09/17/21 1000  azithromycin (ZITHROMAX) 500 mg in sodium chloride 0.9 % 250 mL IVPB  Status:  Discontinued        500 mg 250 mL/hr over 60 Minutes Intravenous Every 24 hours 09/17/21 0622 09/18/21 1247   09/17/21 0445  ceFEPIme (MAXIPIME) 1 g in sodium chloride 0.9 % 100 mL IVPB        1 g 200 mL/hr over 30 Minutes Intravenous  Once 09/17/21 0441 09/18/21 0743   09/17/21 0445  vancomycin (VANCOREADY) IVPB 2000 mg/400 mL        2,000 mg 200 mL/hr over 120 Minutes Intravenous  Once 09/17/21 0441 09/18/21 1437       Data Reviewed: I have personally reviewed following labs and imaging studies  CBC: Recent Labs  Lab 10/02/21 1300 10/03/21 0406 10/05/21 0528 10/06/21 0719 10/07/21 0637  WBC  --  7.6 5.0 4.3 4.3  HGB 8.1* 8.5* 8.3* 8.1* 8.2*  HCT 27.5* 28.9* 28.3* 28.0* 27.3*  MCV  --   77.1* 77.5* 78.2* 76.9*  PLT  --  277 281 283 993    Basic Metabolic Panel: Recent Labs  Lab 10/03/21 0406 10/05/21 0528 10/06/21 0719  NA 138 136 137  K 3.9 3.7 4.0  CL 108 105 107  CO2 _0 GLUCOSE 95 99 89  BUN _1 CREATININE 0.39* 0.42* 0.44  CALCIUM 8.5* 8.6* 8.8*  MG 1.5* 1.6*  --     GFR: Estimated Creatinine Clearance: 150  mL/min (by C-G formula based on SCr of 0.44 mg/dL). Liver Function Tests: No results for input(s): "AST", "ALT", "ALKPHOS", "BILITOT", "PROT", "ALBUMIN" in the last 168 hours. No results for input(s): "LIPASE", "AMYLASE" in the last 168 hours. No results for input(s): "AMMONIA" in the last 168 hours. Coagulation Profile: No results for input(s): "INR", "PROTIME" in the last 168 hours. Cardiac Enzymes: No results for input(s): "CKTOTAL", "CKMB", "CKMBINDEX", "TROPONINI" in the last 168 hours. BNP (last 3 results) No results for input(s): "PROBNP" in the last 8760 hours. HbA1C: No results for input(s): "HGBA1C" in the last 72 hours. CBG: Recent Labs  Lab 10/02/21 2103  GLUCAP 95    Lipid Profile: No results for input(s): "CHOL", "HDL", "LDLCALC", "TRIG", "CHOLHDL", "LDLDIRECT" in the last 72 hours. Thyroid Function Tests: No results for input(s): "TSH", "T4TOTAL", "FREET4", "T3FREE", "THYROIDAB" in the last 72 hours. Anemia Panel: No results for input(s): "VITAMINB12", "FOLATE", "FERRITIN", "TIBC", "IRON", "RETICCTPCT" in the last 72 hours. Urine analysis:    Component Value Date/Time   COLORURINE AMBER (A) 09/15/2021 1554   APPEARANCEUR HAZY (A) 09/15/2021 1554   LABSPEC 1.020 09/15/2021 1554   PHURINE 6.0 09/15/2021 1554   GLUCOSEU NEGATIVE 09/15/2021 1554   HGBUR SMALL (A) 09/15/2021 1554   BILIRUBINUR NEGATIVE 09/15/2021 1554   KETONESUR NEGATIVE 09/15/2021 1554   PROTEINUR NEGATIVE 09/15/2021 1554   NITRITE NEGATIVE 09/15/2021 1554   LEUKOCYTESUR NEGATIVE 09/15/2021 1554   Sepsis  Labs: _0 (procalcitonin:4,lacticidven:4)  No results found for this or any previous visit (from the past 240 hour(s)).        Radiology Studies last 96 hours: No results found.          LOS: 21 days    Time spent: 35 min    Emeterio Reeve, DO [facility] 10/08/2021, 4:36 PM   Staff may message me via secure chat in [place]  but this may not receive immediate response,  please page for urgent matters!  If 7PM-7AM, please contact night-coverage [email or web address]  Dictation software was used to generate the above note. Typos may occur and escape review, as with typed/written notes. Please contact Dr Sheppard Coil directly for clarity if needed.

## 2021-10-09 DIAGNOSIS — J189 Pneumonia, unspecified organism: Secondary | ICD-10-CM | POA: Diagnosis not present

## 2021-10-09 LAB — BASIC METABOLIC PANEL
Anion gap: 6 (ref 5–15)
BUN: 9 mg/dL (ref 6–20)
CO2: 24 mmol/L (ref 22–32)
Calcium: 8.5 mg/dL — ABNORMAL LOW (ref 8.9–10.3)
Chloride: 107 mmol/L (ref 98–111)
Creatinine, Ser: 0.38 mg/dL — ABNORMAL LOW (ref 0.44–1.00)
GFR, Estimated: >60 mL/min (ref >60)
Glucose, Bld: 111 mg/dL — ABNORMAL HIGH (ref 70–99)
Potassium: 3.6 mmol/L (ref 3.5–5.1)
Sodium: 137 mmol/L (ref 135–145)

## 2021-10-09 LAB — CBC
HCT: 26.6 % — ABNORMAL LOW (ref 36.0–46.0)
Hemoglobin: 7.8 g/dL — ABNORMAL LOW (ref 12.0–15.0)
MCH: 22.9 pg — ABNORMAL LOW (ref 26.0–34.0)
MCHC: 29.3 g/dL — ABNORMAL LOW (ref 30.0–36.0)
MCV: 78 fL — ABNORMAL LOW (ref 80.0–100.0)
Platelets: 340 10*3/uL (ref 150–400)
RBC: 3.41 MIL/uL — ABNORMAL LOW (ref 3.87–5.11)
RDW: 24.5 % — ABNORMAL HIGH (ref 11.5–15.5)
WBC: 5 10*3/uL (ref 4.0–10.5)
nRBC: 0 % (ref 0.0–0.2)

## 2021-10-09 LAB — MAGNESIUM: Magnesium: 1.5 mg/dL — ABNORMAL LOW (ref 1.7–2.4)

## 2021-10-09 MED ORDER — ZINC OXIDE 40 % EX OINT
TOPICAL_OINTMENT | CUTANEOUS | Status: DC | PRN
Start: 2021-10-09 — End: 2021-10-13
  Filled 2021-10-09 (×2): qty 113

## 2021-10-09 NOTE — Plan of Care (Signed)
  Problem: Elimination: Goal: Will not experience complications related to bowel motility Outcome: Progressing Goal: Will not experience complications related to urinary retention Outcome: Progressing   Problem: Pain Managment: Goal: General experience of comfort will improve Outcome: Progressing   Problem: Safety: Goal: Ability to remain free from injury will improve Outcome: Progressing   Problem: Activity: Goal: Ability to tolerate increased activity will improve Outcome: Not Progressing. Pt refusing to work with PT

## 2021-10-09 NOTE — Progress Notes (Signed)
PROGRESS NOTE    Ladiamond Gallina  CWC:[id number] DOB: 11-28-1960  DOA: 09/15/2021 Date of Service: 10/09/21 PCP: Latanya Maudlin, NP     Brief Narrative / Hospital Course:  Kasha Howeth is a 61 y.o. female with medical history significant of RV failure, hypertension, GERD, PE on Lovenox, iron deficiency anemia, urinary retention with Foley catheter placement, uterine bleeding, morbid obesity with BMI 78.27, who presents with shortness of breath 09/17/2021. Patient had a significant tachycardia, tachypnea, leukocytosis and hypoxemia with oxygen saturation 87%, she met severe sepsis criteria. Her blood culture positive for MRSA, CT angiogram showed multifocal pneumonia with evidence of pulmonary hypertension. No PE noted but unable to r/o either. Extensive occlusive thrombus RUE notes 05/23 on [place], vascular consulted. Thrombectomy done 05/31. She was seen by ID, vancomycin was started to continue for min 6 weeks. TEE 06/06 no vegetations, cardiology s/o. Respiratory stable 06/08 pulmonary s/o. 06/08 dispo continued to be an issue - pt refusing PT and refusing SNF, requesting home w/ HH to manage IV abx and different hospital bed, TOC following. Attempted to transfer to [place] since she will need to be there anyway for hysterectomy - they have declined transfer 06/09 (Friday). Over the weekend: Patient working on arranging acceptable bed at home, states would be open to SNF but she is declining to work w/ PT here, trying to find assistance w/ IV abx at home [facility] to come to educate her.   Consultants:  Infectious Disease Vascular Surgery  Cardiology PCCU  Procedures: 09/28/21: Thrombectomy RUE 10/04/21: Transesophageal Echocardiogram     Subjective: Pt has no complaints today, she is resting comfortably, biggest concern is would like telemetry to be d/c    ASSESSMENT & PLAN:   Principal Problem:   Multifocal pneumonia Active Problems:   Severe sepsis (HCC)   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HCC)    Pulmonary embolism (HCC)   HTN (hypertension)   Urinary retention   Iron deficiency anemia   Hematuria   Morbid obesity with body mass index of 70 and over in adult [facility])   MRSA bacteremia   Hypomagnesemia   Acute hypoxemic respiratory failure (HCC)   Arterial hypotension   Palliative care by specialist   Goals of care, counseling/discussion   Postmenopausal bleeding   Multifocal pneumonia MRSA Bacteremia multifocal pneumonia and sepsis due to pneumonia: CTA is limited study, did not show central PE, showed multifocal infiltration.  Patient had leukocytosis with WBC 16.7, indicating possible multifocal pneumonia.  Patient met criteria for sepsis with WBC 16.7, heart rate 114, 26.  Lactic acid normal 0.9, 1.4. IV Rocephin and azithromycin (pt received one dose of IV Vancomycin and cefepime in ED) --> vancomycin per ID Given MRSA bacteremia will need IV abx x6 weeks total per ID Discharge antibiotics: Vancomycin 2 grams IV every 24 hours per pharmacy protocol, aim for Vancomycin trough 15-17 (unless otherwise indicated). Duration: 6 weeks, End Date: 11/03/21 [facility] Care Per Protocol. Labs weekly on Monday while on IV antibiotics: CBC with differential, CMP, Vancomycin trough. Labs weekly on Thursday While on antibiotics: Vancomycin Trough, BMP. Please pull PIC at completion of IV antibiotics. Fax weekly labs to [phone]. Clinic Follow Up Appt: 10/25/21 at 9.30 MYCHART video visit  Severe sepsis [facility]) Resolved   Pulmonary embolism ([place]) Patient is on Lovenox, hx PE and recent DVT RUE s/p thrombectomy Plan to transition to Xarelto given BMI   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[place]) Patient has morbid obesity, very difficult to assess volume status.   2D echo on  08/16/21: showed normal left ventricular systolic function with mild LVH, moderate RV systolic dysfunction. Will continue torsemide 20 mg daily 06/06 TEE no cocnerns, cardiology has signed off   Hematuria Iron deficiency  anemia Postmenopausal bleeding Pt also has intermittent vaginal bleeding. Monitor Hbg Continue iron supplement uterine bleeding and need for anticoagulation are also contributing. On Provera BID --> increased to TID May need transfer to [facility] for OBGYN to eval for hysterectomy if Hgb dropping but has been stable for now - [facility] has declined transfer as of 10/06/2021. F/U with Gyn at [facility] as scheduled Needing permanent anticoagulation d/t recent DVT and Hx PE Monitor periodic CBC  HTN (hypertension) IV hydralazine as needed Patient is on torsemide  Morbid obesity with body mass index of 70 and over in adult [facility]) BMI=78.27   and BW= 206.8 Diet and exercise.   Encouraged to lose weight. BMI and associated ambulatory dysfunction precludes safe placement home until equipment can be arranged  Palliative care by specialist See notes  Hypomagnesemia Mg 1.5 on 6/5, given 2 g IV Mg-sulfate. Monitor & replace Mg PRN  Arterial hypotension Resolved.      DVT prophylaxis: Lovenox Code Status: FULL Family Communication: none Disposition Plan / TOC needs: home equipment and IV Abx need to be ensured, transfer to [place] attempted and declined  Barriers to discharge / significant pending items: see above, TOC following              Objective: Vitals:   10/08/21 1937 10/08/21 2352 10/09/21 0540 10/09/21 0753  BP: 111/65 118/68 131/67 116/61  Pulse: 86 91 91 80  Resp: _0 Temp: 98.2 F (36.8 C)  97.8 F (36.6 C) 98 F (36.7 C)  TempSrc:      SpO2: 97% 95% 98% 100%  Weight:      Height:        Intake/Output Summary (Last 24 hours) at 10/09/2021 0758 Last data filed at 10/09/2021 0540 Gross per 24 hour  Intake 240 ml  Output 650 ml  Net -410 ml    Filed Weights   10/05/21 0433 10/06/21 0500 10/08/21 0500  Weight: (!) 240 kg (!) 234.5 kg (!) 231.8 kg    Examination:  Constitutional:  VS as above General Appearance: alert, well-developed, well-nourished,  NAD Eyes: Normal lids and conjunctive, non-icteric sclera Ears, Nose, Mouth, Throat: Normal appearance Neck: No masses, trachea midline Respiratory: Normal respiratory effort Breath sounds normal, no wheeze/rhonchi/rales Cardiovascular: S1/S2 normal, no murmur/rub/gallop auscultated No lower extremity edema Gastrointestinal: Nontender, no masses Habitus limits exam  Musculoskeletal:  No clubbing/cyanosis of digits Neurological: No cranial nerve deficit on limited exam Motor and sensation intact and symmetric Psychiatric: Normal judgment/insight Normal mood and affect       Scheduled Medications:   enoxaparin (LOVENOX) injection  120 mg Subcutaneous Q12H   iron polysaccharides  150 mg Oral Daily   magnesium oxide  400 mg Oral Daily   medroxyPROGESTERone  10 mg Oral TID   multivitamin with minerals  1 tablet Oral Daily   pantoprazole  40 mg Oral Daily   senna-docusate  2 tablet Oral BID    Continuous Infusions:  sodium chloride 10 mL/hr at 09/28/21 1136   vancomycin 2,000 mg (10/08/21 2201)    PRN Medications:  acetaminophen, albuterol, diphenhydrAMINE, docusate sodium, guaiFENesin-dextromethorphan, hydrALAZINE, HYDROmorphone (DILAUDID) injection, magnesium hydroxide, ondansetron **OR** ondansetron (ZOFRAN) IV, polyethylene glycol, traMADol, traZODone, triamcinolone cream  Antimicrobials:  Anti-infectives (From admission, onward)    Start  Dose/Rate Route Frequency Ordered Stop   09/29/21 2100  vancomycin (VANCOREADY) IVPB 2000 mg/400 mL        2,000 mg 200 mL/hr over 120 Minutes Intravenous Every 24 hours 09/29/21 1211     09/28/21 2000  vancomycin (VANCOCIN) 2,000 mg in sodium chloride 0.9 % 500 mL IVPB  Status:  Discontinued        2,000 mg 260 mL/hr over 120 Minutes Intravenous Every 24 hours 09/28/21 1522 09/29/21 1211   09/28/21 2000  vancomycin (VANCOREADY) IVPB 2000 mg/400 mL  Status:  Discontinued        2,000 mg 200 mL/hr over 120 Minutes  Intravenous Every 24 hours 09/28/21 1543 09/28/21 1545   09/26/21 2000  vancomycin (VANCOREADY) IVPB 2000 mg/400 mL  Status:  Discontinued        2,000 mg 200 mL/hr over 120 Minutes Intravenous Every 24 hours 09/26/21 1451 09/28/21 1522   09/20/21 1800  vancomycin (VANCOREADY) IVPB 1750 mg/350 mL  Status:  Discontinued        1,750 mg 175 mL/hr over 120 Minutes Intravenous Every 24 hours 09/20/21 0616 09/26/21 1451   09/20/21 0600  vancomycin (VANCOCIN) 1,500 mg in sodium chloride 0.9 % 500 mL IVPB  Status:  Discontinued        1,500 mg 250 mL/hr over 120 Minutes Intravenous Every 12 hours 09/20/21 0102 09/20/21 0615   09/17/21 1900  vancomycin (VANCOREADY) IVPB 1500 mg/300 mL  Status:  Discontinued        1,500 mg 150 mL/hr over 120 Minutes Intravenous Every 12 hours 09/17/21 1857 09/20/21 0101   09/17/21 1200  cefTRIAXone (ROCEPHIN) 2 g in sodium chloride 0.9 % 100 mL IVPB  Status:  Discontinued        2 g 200 mL/hr over 30 Minutes Intravenous Every 24 hours 09/17/21 0622 09/17/21 1844   09/17/21 1000  azithromycin (ZITHROMAX) 500 mg in sodium chloride 0.9 % 250 mL IVPB  Status:  Discontinued        500 mg 250 mL/hr over 60 Minutes Intravenous Every 24 hours 09/17/21 0622 09/18/21 1247   09/17/21 0445  ceFEPIme (MAXIPIME) 1 g in sodium chloride 0.9 % 100 mL IVPB        1 g 200 mL/hr over 30 Minutes Intravenous  Once 09/17/21 0441 09/18/21 0743   09/17/21 0445  vancomycin (VANCOREADY) IVPB 2000 mg/400 mL        2,000 mg 200 mL/hr over 120 Minutes Intravenous  Once 09/17/21 0441 09/18/21 1437       Data Reviewed: I have personally reviewed following labs and imaging studies  CBC: Recent Labs  Lab 10/03/21 0406 10/05/21 0528 10/06/21 0719 10/07/21 0637 10/09/21 0556  WBC 7.6 5.0 4.3 4.3 5.0  HGB 8.5* 8.3* 8.1* 8.2* 7.8*  HCT 28.9* 28.3* 28.0* 27.3* 26.6*  MCV 77.1* 77.5* 78.2* 76.9* 78.0*  PLT 277 281 283 280 889    Basic Metabolic Panel: Recent Labs  Lab 10/03/21 0406  10/05/21 0528 10/06/21 0719 10/09/21 0556  NA 138 136 137 137  K 3.9 3.7 4.0 3.6  CL 108 105 107 107  CO2 _0 GLUCOSE 95 99 89 111*  BUN _1 CREATININE 0.39* 0.42* 0.44 0.38*  CALCIUM 8.5* 8.6* 8.8* 8.5*  MG 1.5* 1.6*  --  1.5*    GFR: Estimated Creatinine Clearance: 150 mL/min (A) (by C-G formula based on SCr of 0.38 mg/dL (L)). Liver Function Tests: No results for input(s): "AST", "  ALT", "ALKPHOS", "BILITOT", "PROT", "ALBUMIN" in the last 168 hours. No results for input(s): "LIPASE", "AMYLASE" in the last 168 hours. No results for input(s): "AMMONIA" in the last 168 hours. Coagulation Profile: No results for input(s): "INR", "PROTIME" in the last 168 hours. Cardiac Enzymes: No results for input(s): "CKTOTAL", "CKMB", "CKMBINDEX", "TROPONINI" in the last 168 hours. BNP (last 3 results) No results for input(s): "PROBNP" in the last 8760 hours. HbA1C: No results for input(s): "HGBA1C" in the last 72 hours. CBG: Recent Labs  Lab 10/02/21 2103  GLUCAP 95    Lipid Profile: No results for input(s): "CHOL", "HDL", "LDLCALC", "TRIG", "CHOLHDL", "LDLDIRECT" in the last 72 hours. Thyroid Function Tests: No results for input(s): "TSH", "T4TOTAL", "FREET4", "T3FREE", "THYROIDAB" in the last 72 hours. Anemia Panel: No results for input(s): "VITAMINB12", "FOLATE", "FERRITIN", "TIBC", "IRON", "RETICCTPCT" in the last 72 hours. Urine analysis:    Component Value Date/Time   COLORURINE AMBER (A) 09/15/2021 1554   APPEARANCEUR HAZY (A) 09/15/2021 1554   LABSPEC 1.020 09/15/2021 1554   PHURINE 6.0 09/15/2021 1554   GLUCOSEU NEGATIVE 09/15/2021 1554   HGBUR SMALL (A) 09/15/2021 1554   BILIRUBINUR NEGATIVE 09/15/2021 1554   KETONESUR NEGATIVE 09/15/2021 1554   PROTEINUR NEGATIVE 09/15/2021 1554   NITRITE NEGATIVE 09/15/2021 1554   LEUKOCYTESUR NEGATIVE 09/15/2021 1554   Sepsis Labs: _0 (procalcitonin:4,lacticidven:4)  No results found for this or any  previous visit (from the past 240 hour(s)).        Radiology Studies last 96 hours: No results found.          LOS: 22 days    Time spent: 25 min    Emeterio Reeve, DO [facility] 10/09/2021, 7:58 AM   Staff may message me via secure chat in [place]  but this may not receive immediate response,  please page for urgent matters!  If 7PM-7AM, please contact night-coverage [email or web address]  Dictation software was used to generate the above note. Typos may occur and escape review, as with typed/written notes. Please contact Dr Sheppard Coil directly for clarity if needed.

## 2021-10-10 DIAGNOSIS — J189 Pneumonia, unspecified organism: Secondary | ICD-10-CM | POA: Diagnosis not present

## 2021-10-10 LAB — BASIC METABOLIC PANEL
Anion gap: 6 (ref 5–15)
BUN: 8 mg/dL (ref 6–20)
CO2: 23 mmol/L (ref 22–32)
Calcium: 8.5 mg/dL — ABNORMAL LOW (ref 8.9–10.3)
Chloride: 108 mmol/L (ref 98–111)
Creatinine, Ser: 0.33 mg/dL — ABNORMAL LOW (ref 0.44–1.00)
GFR, Estimated: >60 mL/min (ref >60)
Glucose, Bld: 90 mg/dL (ref 70–99)
Potassium: 3.5 mmol/L (ref 3.5–5.1)
Sodium: 137 mmol/L (ref 135–145)

## 2021-10-10 LAB — MAGNESIUM: Magnesium: 1.5 mg/dL — ABNORMAL LOW (ref 1.7–2.4)

## 2021-10-10 LAB — CBC
HCT: 28.3 % — ABNORMAL LOW (ref 36.0–46.0)
Hemoglobin: 8.2 g/dL — ABNORMAL LOW (ref 12.0–15.0)
MCH: 22.5 pg — ABNORMAL LOW (ref 26.0–34.0)
MCHC: 29 g/dL — ABNORMAL LOW (ref 30.0–36.0)
MCV: 77.5 fL — ABNORMAL LOW (ref 80.0–100.0)
Platelets: 356 10*3/uL (ref 150–400)
RBC: 3.65 MIL/uL — ABNORMAL LOW (ref 3.87–5.11)
RDW: 24.4 % — ABNORMAL HIGH (ref 11.5–15.5)
WBC: 4.5 10*3/uL (ref 4.0–10.5)
nRBC: 0 % (ref 0.0–0.2)

## 2021-10-10 NOTE — Progress Notes (Signed)
PROGRESS NOTE    Krystal Francis  VEH:[id number] DOB: February 08, 1961  DOA: 09/15/2021 Date of Service: 10/10/21 PCP: Latanya Maudlin, NP     Brief Narrative / Hospital Course:  Krystal Francis is a 61 y.o. female with medical history significant of RV failure, hypertension, GERD, PE on Lovenox, iron deficiency anemia, urinary retention with Foley catheter placement, uterine bleeding, morbid obesity with BMI 78.27, who presents with shortness of breath 09/17/2021. Patient had a significant tachycardia, tachypnea, leukocytosis and hypoxemia with oxygen saturation 87%, she met severe sepsis criteria. Her blood culture positive for MRSA, CT angiogram showed multifocal pneumonia with evidence of pulmonary hypertension. No PE noted but unable to r/o either. Extensive occlusive thrombus RUE notes 05/23 on [place], vascular consulted. Thrombectomy done 05/31. She was seen by ID, vancomycin was started to continue for min 6 weeks. TEE 06/06 no vegetations, cardiology s/o. Respiratory stable 06/08 pulmonary s/o. 06/08 dispo continued to be an issue - pt refusing PT and refusing SNF, requesting home w/ HH to manage IV abx and different hospital bed, TOC following. Attempted to transfer to [place] since she will need to be there anyway for hysterectomy - they have declined transfer 06/09 (Friday). Over the weekend: Patient working on arranging acceptable bed at home, states would be open to SNF but she is declining to work w/ PT here, trying to find assistance w/ IV abx at home [facility] to come to educate her, sent her video and will check back w/ patient later 06/12. Bed still pending. TOC working on offering services as able since patient is still declining SNF   Consultants:  Infectious Disease Vascular Surgery  Cardiology PCCU  Procedures: 09/28/21: Thrombectomy RUE 10/04/21: Transesophageal Echocardiogram     Subjective: Pt has no complaints today, she is resting comfortably, biggest concern is would like telemetry to  be d/c    ASSESSMENT & PLAN:   Principal Problem:   Multifocal pneumonia Active Problems:   Severe sepsis (HCC)   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HCC)   Pulmonary embolism (HCC)   HTN (hypertension)   Urinary retention   Iron deficiency anemia   Hematuria   Morbid obesity with body mass index of 70 and over in adult [facility])   MRSA bacteremia   Hypomagnesemia   Acute hypoxemic respiratory failure (HCC)   Arterial hypotension   Palliative care by specialist   Goals of care, counseling/discussion   Postmenopausal bleeding   Multifocal pneumonia MRSA Bacteremia multifocal pneumonia and sepsis due to pneumonia: CTA is limited study, did not show central PE, showed multifocal infiltration.  Patient had leukocytosis with WBC 16.7, indicating possible multifocal pneumonia.  Patient met criteria for sepsis with WBC 16.7, heart rate 114, 26.  Lactic acid normal 0.9, 1.4. IV Rocephin and azithromycin (pt received one dose of IV Vancomycin and cefepime in ED) --> vancomycin per ID Given MRSA bacteremia will need IV abx x6 weeks total per ID Discharge antibiotics: Vancomycin 2 grams IV every 24 hours per pharmacy protocol, aim for Vancomycin trough 15-17 (unless otherwise indicated). Duration: 6 weeks, End Date: 11/03/21 [facility] Care Per Protocol. Labs weekly on Monday while on IV antibiotics: CBC with differential, CMP, Vancomycin trough. Labs weekly on Thursday While on antibiotics: Vancomycin Trough, BMP. Please pull PIC at completion of IV antibiotics. Fax weekly labs to [phone]. Clinic Follow Up Appt: 10/25/21 at 9.30 MYCHART video visit  Severe sepsis [facility]) Resolved   Pulmonary embolism ([place]) Patient is on Lovenox, hx PE  recent DVT RUE, s/p thrombectomy Plan  to transition to Xarelto given BMI   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[place]) Patient has morbid obesity, very difficult to assess volume status.   2D echo on 08/16/21: showed normal left ventricular systolic function with mild LVH, moderate RV systolic  dysfunction. Will continue torsemide 20 mg daily 06/06 TEE no cocnerns, cardiology has signed off   Hematuria Iron deficiency anemia Postmenopausal bleeding Pt also has intermittent vaginal bleeding. Monitor Hbg Continue iron supplement uterine bleeding and need for anticoagulation are also contributing. On Provera BID --> increased to TID May need transfer to [facility] for OBGYN to eval for hysterectomy if Hgb dropping but has been stable for now - [facility] has declined transfer as of 10/06/2021. F/U with Gyn at [facility] as scheduled Needing permanent anticoagulation d/t recent DVT and Hx PE Monitor periodic CBC, have been stable  HTN (hypertension) IV hydralazine as needed Patient is on torsemide  Morbid obesity with body mass index of 70 and over in adult [facility]) BMI=78.27   and BW= 206.8 Encouraged to lose weight. BMI and associated ambulatory dysfunction precludes safe placement home until equipment can be arranged  Palliative care by specialist See notes  Hypomagnesemia Mg 1.5 on 6/5, given 2 g IV Mg-sulfate. Monitor & replace Mg PRN  Arterial hypotension Resolved.      DVT prophylaxis: Lovenox Code Status: FULL Family Communication: none Disposition Plan / TOC needs: home equipment and IV Abx need to be ensured, transfer to [facility] attempted and declined  Barriers to discharge / significant pending items: see above, TOC following. Pt has been medically stable for discharge            Objective: Vitals:   10/09/21 2015 10/10/21 0500 10/10/21 0556 10/10/21 0837  BP: 116/60  (!) 98/57 123/70  Pulse: 80  94 91  Resp: _0 Temp: 98.1 F (36.7 C)  98.5 F (36.9 C) 98.1 F (36.7 C)  TempSrc:      SpO2: 96%  97% 96%  Weight:  (!) 233.7 kg    Height:        Intake/Output Summary (Last 24 hours) at 10/10/2021 1543 Last data filed at 10/10/2021 0846 Gross per 24 hour  Intake 400.11 ml  Output 1400 ml  Net -999.89 ml    Filed Weights   10/06/21 0500 10/08/21  0500 10/10/21 0500  Weight: (!) 234.5 kg (!) 231.8 kg (!) 233.7 kg    Examination:  Constitutional:  VS as above General Appearance: alert, well-developed, well-nourished, NAD Eyes: Normal lids and conjunctive, non-icteric sclera Ears, Nose, Mouth, Throat: Normal appearance Neck: No masses, trachea midline Respiratory: Normal respiratory effort Musculoskeletal:  No clubbing/cyanosis of digits Neurological: No cranial nerve deficit on limited exam Psychiatric: Normal judgment/insight Normal mood and affect       Scheduled Medications:   enoxaparin (LOVENOX) injection  120 mg Subcutaneous Q12H   iron polysaccharides  150 mg Oral Daily   magnesium oxide  400 mg Oral Daily   medroxyPROGESTERone  10 mg Oral TID   multivitamin with minerals  1 tablet Oral Daily   pantoprazole  40 mg Oral Daily   senna-docusate  2 tablet Oral BID    Continuous Infusions:  vancomycin Stopped (10/09/21 2337)    PRN Medications:  acetaminophen, albuterol, diphenhydrAMINE, docusate sodium, guaiFENesin-dextromethorphan, hydrALAZINE, HYDROmorphone (DILAUDID) injection, liver oil-zinc oxide, magnesium hydroxide, ondansetron **OR** ondansetron (ZOFRAN) IV, polyethylene glycol, traMADol, traZODone, triamcinolone cream  Antimicrobials:  Anti-infectives (From admission, onward)    Start     Dose/Rate Route Frequency  Ordered Stop   09/29/21 2100  vancomycin (VANCOREADY) IVPB 2000 mg/400 mL        2,000 mg 200 mL/hr over 120 Minutes Intravenous Every 24 hours 09/29/21 1211     09/28/21 2000  vancomycin (VANCOCIN) 2,000 mg in sodium chloride 0.9 % 500 mL IVPB  Status:  Discontinued        2,000 mg 260 mL/hr over 120 Minutes Intravenous Every 24 hours 09/28/21 1522 09/29/21 1211   09/28/21 2000  vancomycin (VANCOREADY) IVPB 2000 mg/400 mL  Status:  Discontinued        2,000 mg 200 mL/hr over 120 Minutes Intravenous Every 24 hours 09/28/21 1543 09/28/21 1545   09/26/21 2000  vancomycin  (VANCOREADY) IVPB 2000 mg/400 mL  Status:  Discontinued        2,000 mg 200 mL/hr over 120 Minutes Intravenous Every 24 hours 09/26/21 1451 09/28/21 1522   09/20/21 1800  vancomycin (VANCOREADY) IVPB 1750 mg/350 mL  Status:  Discontinued        1,750 mg 175 mL/hr over 120 Minutes Intravenous Every 24 hours 09/20/21 0616 09/26/21 1451   09/20/21 0600  vancomycin (VANCOCIN) 1,500 mg in sodium chloride 0.9 % 500 mL IVPB  Status:  Discontinued        1,500 mg 250 mL/hr over 120 Minutes Intravenous Every 12 hours 09/20/21 0102 09/20/21 0615   09/17/21 1900  vancomycin (VANCOREADY) IVPB 1500 mg/300 mL  Status:  Discontinued        1,500 mg 150 mL/hr over 120 Minutes Intravenous Every 12 hours 09/17/21 1857 09/20/21 0101   09/17/21 1200  cefTRIAXone (ROCEPHIN) 2 g in sodium chloride 0.9 % 100 mL IVPB  Status:  Discontinued        2 g 200 mL/hr over 30 Minutes Intravenous Every 24 hours 09/17/21 0622 09/17/21 1844   09/17/21 1000  azithromycin (ZITHROMAX) 500 mg in sodium chloride 0.9 % 250 mL IVPB  Status:  Discontinued        500 mg 250 mL/hr over 60 Minutes Intravenous Every 24 hours 09/17/21 0622 09/18/21 1247   09/17/21 0445  ceFEPIme (MAXIPIME) 1 g in sodium chloride 0.9 % 100 mL IVPB        1 g 200 mL/hr over 30 Minutes Intravenous  Once 09/17/21 0441 09/18/21 0743   09/17/21 0445  vancomycin (VANCOREADY) IVPB 2000 mg/400 mL        2,000 mg 200 mL/hr over 120 Minutes Intravenous  Once 09/17/21 0441 09/18/21 1437       Data Reviewed: I have personally reviewed following labs and imaging studies  CBC: Recent Labs  Lab 10/05/21 0528 10/06/21 0719 10/07/21 0637 10/09/21 0556 10/10/21 0602  WBC 5.0 4.3 4.3 5.0 4.5  HGB 8.3* 8.1* 8.2* 7.8* 8.2*  HCT 28.3* 28.0* 27.3* 26.6* 28.3*  MCV 77.5* 78.2* 76.9* 78.0* 77.5*  PLT 281 283 280 340 176    Basic Metabolic Panel: Recent Labs  Lab 10/05/21 0528 10/06/21 0719 10/09/21 0556 10/10/21 0602  NA 136 137 137 137  K 3.7 4.0 3.6  3.5  CL 105 107 107 108  CO2 _0 GLUCOSE 99 89 111* 90  BUN _1 CREATININE 0.42* 0.44 0.38* 0.33*  CALCIUM 8.6* 8.8* 8.5* 8.5*  MG 1.6*  --  1.5* 1.5*    GFR: Estimated Creatinine Clearance: 151 mL/min (A) (by C-G formula based on SCr of 0.33 mg/dL (L)). Liver Function Tests: No results for input(s): "AST", "ALT", "ALKPHOS", "BILITOT", "  PROT", "ALBUMIN" in the last 168 hours. No results for input(s): "LIPASE", "AMYLASE" in the last 168 hours. No results for input(s): "AMMONIA" in the last 168 hours. Coagulation Profile: No results for input(s): "INR", "PROTIME" in the last 168 hours. Cardiac Enzymes: No results for input(s): "CKTOTAL", "CKMB", "CKMBINDEX", "TROPONINI" in the last 168 hours. BNP (last 3 results) No results for input(s): "PROBNP" in the last 8760 hours. HbA1C: No results for input(s): "HGBA1C" in the last 72 hours. CBG: No results for input(s): "GLUCAP" in the last 168 hours.  Lipid Profile: No results for input(s): "CHOL", "HDL", "LDLCALC", "TRIG", "CHOLHDL", "LDLDIRECT" in the last 72 hours. Thyroid Function Tests: No results for input(s): "TSH", "T4TOTAL", "FREET4", "T3FREE", "THYROIDAB" in the last 72 hours. Anemia Panel: No results for input(s): "VITAMINB12", "FOLATE", "FERRITIN", "TIBC", "IRON", "RETICCTPCT" in the last 72 hours. Urine analysis:    Component Value Date/Time   COLORURINE AMBER (A) 09/15/2021 1554   APPEARANCEUR HAZY (A) 09/15/2021 1554   LABSPEC 1.020 09/15/2021 1554   PHURINE 6.0 09/15/2021 1554   GLUCOSEU NEGATIVE 09/15/2021 1554   HGBUR SMALL (A) 09/15/2021 1554   BILIRUBINUR NEGATIVE 09/15/2021 1554   KETONESUR NEGATIVE 09/15/2021 1554   PROTEINUR NEGATIVE 09/15/2021 1554   NITRITE NEGATIVE 09/15/2021 1554   LEUKOCYTESUR NEGATIVE 09/15/2021 1554   Sepsis Labs: _0 (procalcitonin:4,lacticidven:4)  No results found for this or any previous visit (from the past 240 hour(s)).        Radiology Studies  last 96 hours: No results found.          LOS: 23 days    Time spent: 25 min    Emeterio Reeve, DO [facility] 10/10/2021, 3:43 PM   Staff may message me via secure chat in [place]  but this may not receive immediate response,  please page for urgent matters!  If 7PM-7AM, please contact night-coverage [email or web address]  Dictation software was used to generate the above note. Typos may occur and escape review, as with typed/written notes. Please contact Dr Sheppard Coil directly for clarity if needed.

## 2021-10-10 NOTE — TOC Progression Note (Signed)
Transition of Care [facility]) - Progression Note    Patient Details  Name: Krystal Francis MRN: [id number] Date of Birth: 1960-05-29  Transition of Care [facility]) CM/SW [place], RN Phone Number: 10/10/2021, 9:56 AM  Clinical Narrative:   Carolynn Sayers reached out to patient re: home IV antibiotics.  She sent patient video and will check back with patient today.    Expected Discharge Plan: Skilled Nursing Facility Barriers to Discharge: Continued Medical Work up, SNF Pending bed offer, Ship broker  Expected Discharge Plan and Services Expected Discharge Plan: [place]                                               Social Determinants of Health (SDOH) Interventions    Readmission Risk Interventions     No data to display

## 2021-10-10 NOTE — Progress Notes (Signed)
PHARMACY CONSULT NOTE FOR:  OUTPATIENT  PARENTERAL ANTIBIOTIC THERAPY (OPAT)  Indication: MRSA bacteremia Regimen: vancomycin 2gm IV q24h End date: 11/03/2021  Please pull PIC at completion of IV antibiotics Fax weekly labs to [phone]  IV antibiotic discharge orders are pended. To discharging provider:  please sign these orders via discharge navigator,  Select New Orders & click on the button choice - Manage This Unsigned Work.     Thank you for allowing pharmacy to be a part of this patient's care.  Doreene Eland, PharmD, BCPS, BCIDP Work Cell: [phone] 10/10/2021 11:47 AM

## 2021-10-11 DIAGNOSIS — J15212 Pneumonia due to Methicillin resistant Staphylococcus aureus: Secondary | ICD-10-CM | POA: Diagnosis not present

## 2021-10-11 DIAGNOSIS — B9562 Methicillin resistant Staphylococcus aureus infection as the cause of diseases classified elsewhere: Secondary | ICD-10-CM | POA: Diagnosis not present

## 2021-10-11 DIAGNOSIS — R7881 Bacteremia: Secondary | ICD-10-CM | POA: Diagnosis not present

## 2021-10-11 DIAGNOSIS — I82721 Chronic embolism and thrombosis of deep veins of right upper extremity: Secondary | ICD-10-CM | POA: Diagnosis not present

## 2021-10-11 LAB — VANCOMYCIN, TROUGH: Vancomycin Tr: 10 ug/mL — ABNORMAL LOW (ref 15–20)

## 2021-10-11 NOTE — TOC Progression Note (Signed)
Transition of Care [facility]) - Progression Note    Patient Details  Name: Krystal Francis MRN: [id number] Date of Birth: [birth year]  Transition of Care [facility]) CM/SW Contact  Anselm Pancoast, RN Phone Number: 10/11/2021, 2:21 PM  Clinical Narrative:    Met with patient at bedside to discuss discharge plan/needs. Patient was completing infusion training upon entering room. Patient reports eager to discharge home once the hospital bed is delivered. Hopeful for delivery tomorrow however not confirmed at this time. Patient reports with new bed style she will be able to get herself in and out of bed with her rollator. Discussed her interest in a stool and some options available via retail and the importance of ensuring safety with weight limits and stability. Patient reports being depressed in the past due to not being able to get out of bed however she is feeling much stronger and speaks to her sister often when she is feeling discouraged. Patient reports being able to use rollator to get out of home in emergency. Will follow for confirmation of bed delivery and assist with EMS transport at that time.    Expected Discharge Plan: Skilled Nursing Facility Barriers to Discharge: Continued Medical Work up, SNF Pending bed offer, Ship broker  Expected Discharge Plan and Services Expected Discharge Plan: [place]                                               Social Determinants of Health (SDOH) Interventions    Readmission Risk Interventions     No data to display

## 2021-10-11 NOTE — Progress Notes (Signed)
PROGRESS NOTE    Shayona Francis  GBT:[id number] DOB: 08-23-1960  DOA: 09/15/2021 Date of Service: 10/11/21 PCP: Latanya Maudlin, NP     Hospital Course:  Krystal Francis is a 61 y.o. female with medical history significant of RV failure, hypertension, GERD, PE on Lovenox, iron deficiency anemia, urinary retention with Foley catheter placement, uterine bleeding, morbid obesity with BMI 78.27,  presented with shortness of breath 09/17/2021. Patient had tachycardia, tachypnea, leukocytosis and hypoxemia with oxygen saturation 87%, she met severe sepsis criteria. Her blood culture positive for MRSA, CT angiogram showed multifocal pneumonia with evidence of pulmonary hypertension. No PE noted but unable to r/o either.  Extensive occlusive thrombus RUE notes 05/23 on [place], vascular consulted. Thrombectomy done 05/31.  She was seen by ID, vancomycin was started to continue for min 6 weeks. TEE 06/06 no vegetations, cardiology s/o. Respiratory stable 06/08 pulmonary s/o.  06/08 dispo continued to be an issue - pt refusing PT and refusing SNF, requesting home w/ HH to manage IV abx and different hospital bed, TOC following. Attempted to transfer to [place] since she will need to be there anyway for hysterectomy - they have declined transfer 06/09 (Friday). Over the weekend: Patient working on arranging acceptable bed at home - she needs one that goes unusually low to the ground, she has been declining to work w/PT here as she states she can get out of the hospital bed but not back into it since it's too high off the floor.  Pt is arranging to pay out of pocket for the speciality desired bed as OTC was unable to arrange one. Pt has been educated about administering home IV antibiotic.    Consultants:  Infectious Disease Vascular Surgery  Cardiology PCCU Palliative Care   Procedures: 09/28/21: Thrombectomy RUE 10/04/21: Transesophageal Echocardiogram     Subjective: Pt has no complaints today, she is  resting comfortably, no CP/SOB, no HA/VC, no N/V   ASSESSMENT & PLAN:   Principal Problem:   Multifocal pneumonia Active Problems:   Severe sepsis (HCC)   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HCC)   Pulmonary embolism (HCC)   HTN (hypertension)   Urinary retention   Iron deficiency anemia   Hematuria   Morbid obesity with body mass index of 70 and over in adult [facility])   MRSA bacteremia   Hypomagnesemia   Acute hypoxemic respiratory failure (HCC)   Arterial hypotension   Palliative care by specialist   Goals of care, counseling/discussion   Postmenopausal bleeding   Multifocal pneumonia MRSA Bacteremia multifocal pneumonia and sepsis due to pneumonia: CTA is limited study, did not show central PE, showed multifocal infiltration.  Patient had leukocytosis with WBC 16.7, indicating possible multifocal pneumonia.  Patient met criteria for sepsis with WBC 16.7, heart rate 114, 26.  Lactic acid normal 0.9, 1.4. IV Rocephin and azithromycin (pt received one dose of IV Vancomycin and cefepime in ED) --> vancomycin per ID Given MRSA bacteremia will need IV abx x6 weeks total per ID Discharge antibiotics: Vancomycin 2 grams IV every 24 hours per pharmacy protocol, aim for Vancomycin trough 15-17 (unless otherwise indicated). Duration: 6 weeks, End Date: 11/03/21 [facility] Care Per Protocol. Labs weekly on Monday while on IV antibiotics: CBC with differential, CMP, Vancomycin trough. Labs weekly on Thursday While on antibiotics: Vancomycin Trough, BMP. Please pull PIC at completion of IV antibiotics. Fax weekly labs to [phone]. Clinic Follow Up Appt: 10/25/21 at 9.30 [facility] video visit  Severe sepsis [facility]) Resolved   Pulmonary embolism ([place])  Patient is on Lovenox, hx PE  recent DVT RUE, s/p thrombectomy Plan to transition to Xarelto given BMI   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[place]) Patient has morbid obesity, very difficult to assess volume status.   2D echo on 08/16/21: showed normal left ventricular systolic  function with mild LVH, moderate RV systolic dysfunction. Will continue torsemide 20 mg daily 06/06 TEE no cocnerns, cardiology has signed off   Hematuria Iron deficiency anemia Postmenopausal bleeding Pt also has intermittent vaginal bleeding. Monitor Hbg Continue iron supplement uterine bleeding and need for anticoagulation are also contributing. On Provera BID --> increased to TID May need transfer to [facility] for OBGYN to eval for hysterectomy if Hgb dropping but has been stable for now - [facility] has declined transfer as of 10/06/2021. F/U with Gyn at [facility] as scheduled Needing permanent anticoagulation d/t recent DVT and Hx PE Monitor periodic CBC, have been stable  HTN (hypertension) IV hydralazine as needed Patient is on torsemide  Morbid obesity with body mass index of 70 and over in adult [facility]) BMI=78.27   and BW= 206.8 Encouraged to lose weight. BMI and associated ambulatory dysfunction precludes safe placement home until equipment can be arranged - PLAN IS FOR DISCHARGE THURSDAY 10/13/21, patient confirms that her specialty hospital bed will be delivered by that date (please confer w/ TOC to confirm this is in place)   Palliative care by specialist See notes  Hypomagnesemia Mg 1.5 on 6/5, given 2 g IV Mg-sulfate. Monitor & replace Mg PRN  Arterial hypotension Resolved.      DVT prophylaxis: Lovenox Code Status: FULL Family Communication: none Disposition Plan / TOC needs: home equipment and IV Abx need to be ensured, transfer to [facility] attempted and declined  Barriers to discharge / significant pending items: see above, TOC following. Pt has been medically stable for discharge            Objective: Vitals:   10/10/21 2031 10/11/21 0500 10/11/21 0623 10/11/21 0828  BP: (!) 125/57  102/63 100/67  Pulse: 78  86 85  Resp: _0 Temp: 98.2 F (36.8 C)  98.2 F (36.8 C) 98 F (36.7 C)  TempSrc:   Oral Oral  SpO2: 99%  97% 98%  Weight:  (!) 233.6 kg     Height:        Intake/Output Summary (Last 24 hours) at 10/11/2021 1624 Last data filed at 10/10/2021 1858 Gross per 24 hour  Intake --  Output 500 ml  Net -500 ml    Filed Weights   10/08/21 0500 10/10/21 0500 10/11/21 0500  Weight: (!) 231.8 kg (!) 233.7 kg (!) 233.6 kg    Examination:  Constitutional:  VS as above General Appearance: alert, well-developed, well-nourished, NAD Eyes: Normal lids and conjunctive, non-icteric sclera Ears, Nose, Mouth, Throat: Normal appearance Neck: No masses, trachea midline Respiratory: Normal respiratory effort Musculoskeletal:  No clubbing/cyanosis of digits Neurological: No cranial nerve deficit on limited exam Psychiatric: Normal judgment/insight Normal mood and affect       Scheduled Medications:   enoxaparin (LOVENOX) injection  120 mg Subcutaneous Q12H   iron polysaccharides  150 mg Oral Daily   magnesium oxide  400 mg Oral Daily   medroxyPROGESTERone  10 mg Oral TID   multivitamin with minerals  1 tablet Oral Daily   pantoprazole  40 mg Oral Daily   senna-docusate  2 tablet Oral BID    Continuous Infusions:  vancomycin 2,000 mg (10/10/21 2104)    PRN Medications:  acetaminophen, albuterol, diphenhydrAMINE, docusate sodium, guaiFENesin-dextromethorphan, hydrALAZINE, HYDROmorphone (DILAUDID) injection, liver oil-zinc oxide, magnesium hydroxide, ondansetron **OR** ondansetron (ZOFRAN) IV, polyethylene glycol, traMADol, traZODone, triamcinolone cream  Antimicrobials:  Anti-infectives (From admission, onward)    Start     Dose/Rate Route Frequency Ordered Stop   09/29/21 2100  vancomycin (VANCOREADY) IVPB 2000 mg/400 mL        2,000 mg 200 mL/hr over 120 Minutes Intravenous Every 24 hours 09/29/21 1211     09/28/21 2000  vancomycin (VANCOCIN) 2,000 mg in sodium chloride 0.9 % 500 mL IVPB  Status:  Discontinued        2,000 mg 260 mL/hr over 120 Minutes Intravenous Every 24 hours 09/28/21 1522 09/29/21 1211    09/28/21 2000  vancomycin (VANCOREADY) IVPB 2000 mg/400 mL  Status:  Discontinued        2,000 mg 200 mL/hr over 120 Minutes Intravenous Every 24 hours 09/28/21 1543 09/28/21 1545   09/26/21 2000  vancomycin (VANCOREADY) IVPB 2000 mg/400 mL  Status:  Discontinued        2,000 mg 200 mL/hr over 120 Minutes Intravenous Every 24 hours 09/26/21 1451 09/28/21 1522   09/20/21 1800  vancomycin (VANCOREADY) IVPB 1750 mg/350 mL  Status:  Discontinued        1,750 mg 175 mL/hr over 120 Minutes Intravenous Every 24 hours 09/20/21 0616 09/26/21 1451   09/20/21 0600  vancomycin (VANCOCIN) 1,500 mg in sodium chloride 0.9 % 500 mL IVPB  Status:  Discontinued        1,500 mg 250 mL/hr over 120 Minutes Intravenous Every 12 hours 09/20/21 0102 09/20/21 0615   09/17/21 1900  vancomycin (VANCOREADY) IVPB 1500 mg/300 mL  Status:  Discontinued        1,500 mg 150 mL/hr over 120 Minutes Intravenous Every 12 hours 09/17/21 1857 09/20/21 0101   09/17/21 1200  cefTRIAXone (ROCEPHIN) 2 g in sodium chloride 0.9 % 100 mL IVPB  Status:  Discontinued        2 g 200 mL/hr over 30 Minutes Intravenous Every 24 hours 09/17/21 0622 09/17/21 1844   09/17/21 1000  azithromycin (ZITHROMAX) 500 mg in sodium chloride 0.9 % 250 mL IVPB  Status:  Discontinued        500 mg 250 mL/hr over 60 Minutes Intravenous Every 24 hours 09/17/21 0622 09/18/21 1247   09/17/21 0445  ceFEPIme (MAXIPIME) 1 g in sodium chloride 0.9 % 100 mL IVPB        1 g 200 mL/hr over 30 Minutes Intravenous  Once 09/17/21 0441 09/18/21 0743   09/17/21 0445  vancomycin (VANCOREADY) IVPB 2000 mg/400 mL        2,000 mg 200 mL/hr over 120 Minutes Intravenous  Once 09/17/21 0441 09/18/21 1437       Data Reviewed: I have personally reviewed following labs and imaging studies  CBC: Recent Labs  Lab 10/05/21 0528 10/06/21 0719 10/07/21 0637 10/09/21 0556 10/10/21 0602  WBC 5.0 4.3 4.3 5.0 4.5  HGB 8.3* 8.1* 8.2* 7.8* 8.2*  HCT 28.3* 28.0* 27.3* 26.6*  28.3*  MCV 77.5* 78.2* 76.9* 78.0* 77.5*  PLT 281 283 280 340 540    Basic Metabolic Panel: Recent Labs  Lab 10/05/21 0528 10/06/21 0719 10/09/21 0556 10/10/21 0602  NA 136 137 137 137  K 3.7 4.0 3.6 3.5  CL 105 107 107 108  CO2 _0 GLUCOSE 99 89 111* 90  BUN _1 CREATININE 0.42* 0.44 0.38* 0.33*  CALCIUM 8.6* 8.8* 8.5* 8.5*  MG 1.6*  --  1.5* 1.5*    GFR: Estimated Creatinine Clearance: 151 mL/min (A) (by C-G formula based on SCr of 0.33 mg/dL (L)). Liver Function Tests: No results for input(s): "AST", "ALT", "ALKPHOS", "BILITOT", "PROT", "ALBUMIN" in the last 168 hours. No results for input(s): "LIPASE", "AMYLASE" in the last 168 hours. No results for input(s): "AMMONIA" in the last 168 hours. Coagulation Profile: No results for input(s): "INR", "PROTIME" in the last 168 hours. Cardiac Enzymes: No results for input(s): "CKTOTAL", "CKMB", "CKMBINDEX", "TROPONINI" in the last 168 hours. BNP (last 3 results) No results for input(s): "PROBNP" in the last 8760 hours. HbA1C: No results for input(s): "HGBA1C" in the last 72 hours. CBG: No results for input(s): "GLUCAP" in the last 168 hours.  Lipid Profile: No results for input(s): "CHOL", "HDL", "LDLCALC", "TRIG", "CHOLHDL", "LDLDIRECT" in the last 72 hours. Thyroid Function Tests: No results for input(s): "TSH", "T4TOTAL", "FREET4", "T3FREE", "THYROIDAB" in the last 72 hours. Anemia Panel: No results for input(s): "VITAMINB12", "FOLATE", "FERRITIN", "TIBC", "IRON", "RETICCTPCT" in the last 72 hours. Urine analysis:    Component Value Date/Time   COLORURINE AMBER (A) 09/15/2021 1554   APPEARANCEUR HAZY (A) 09/15/2021 1554   LABSPEC 1.020 09/15/2021 1554   PHURINE 6.0 09/15/2021 1554   GLUCOSEU NEGATIVE 09/15/2021 1554   HGBUR SMALL (A) 09/15/2021 1554   BILIRUBINUR NEGATIVE 09/15/2021 1554   KETONESUR NEGATIVE 09/15/2021 1554   PROTEINUR NEGATIVE 09/15/2021 1554   NITRITE NEGATIVE 09/15/2021 1554    LEUKOCYTESUR NEGATIVE 09/15/2021 1554   Sepsis Labs: _0 (procalcitonin:4,lacticidven:4)  No results found for this or any previous visit (from the past 240 hour(s)).        Radiology Studies last 96 hours: No results found.          LOS: 24 days    Time spent: 25 min    Emeterio Reeve, DO [facility] 10/11/2021, 4:24 PM   Staff may message me via secure chat in [place]  but this may not receive immediate response,  please page for urgent matters!  If 7PM-7AM, please contact night-coverage [email or web address]  Dictation software was used to generate the above note. Typos may occur and escape review, as with typed/written notes. Please contact Dr Sheppard Coil directly for clarity if needed.

## 2021-10-11 NOTE — Consult Note (Signed)
Pharmacy Antibiotic Note  Emil Klassen is a 61 y.o. female w/ h/o RV failure, HTN, GERD, PE PTA on Lovenox, iron deficiency anemia, urinary retention with Foley catheter placement, uterine bleeding, morbid obesity with BMI 78.27, who presents with shortness of breath > w/u c/w PE & multifocal PNA. Pt admitted on 09/15/2021 with pneumonia and bacteremia & BCID resulted with MRSA 4 of 4 vials on 5/20.  Pharmacy has been consulted for escalation of abx to Vancomycin dosing.   -planning 6 weeks IV Vancomycin per ID Renal: SCr stable Repeat blood cxs 5/25: MRSE Repeat blood cx 5/30 MRSE (contaminant) 6/6 TEE: neg for endocarditis  6/13: vancomycin trough =    mcg/mL at 2130  on vancomycin 2gm IV q24h  Previous Vancomycin levels: 6/1 vancomycin 2gm IV q24h given at 22:19 6/2 vanco peak = 26 mcg/ml at  02:02 6/2 random vancomycin level = 12.0 at 18:29 True Cmax = 28.2, Cmin = 10 and AUC = 425    Plan:    Continue vancomycin  to 2000 mg IV every 24 hours. Check trough tonight - like to keep similar to previous level (~12 mcg/ml, but < 17 mcg/mL) Follow renal function  See OPAT orders   Height: '5\' 4"'$  (162.6 cm) Weight: (!) 233.6 kg (515 lb) IBW/kg (Calculated) : 54.7  Temp (24hrs), Avg:98.2 F (36.8 C), Min:98 F (36.7 C), Max:98.5 F (36.9 C)  Recent Labs  Lab 10/05/21 0528 10/05/21 2138 10/06/21 0719 10/07/21 0637 10/09/21 0556 10/10/21 0602  WBC 5.0  --  4.3 4.3 5.0 4.5  CREATININE 0.42*  --  0.44  --  0.38* 0.33*  VANCOTROUGH  --  12*  --   --   --   --      Estimated Creatinine Clearance: 151 mL/min (A) (by C-G formula based on SCr of 0.33 mg/dL (L)).    Allergies  Allergen Reactions   Furosemide Rash    Per [facility] records - rash possibly to IV furosemide but tablets ok   Chlorhexidine Hives, Dermatitis, Rash and Other (See Comments)    Blisters right after CHG wipe down    Antimicrobials this admission: Azithro (5/20-5/21) CRO x1 in ED (5/20) VAN (5/20 >>   Dose  adjustments this admission: 5/22: Vancomycin levels  on 1gm IV q12h (with 5th maintenance dose Vancomycin peak 5/22 at 2141 = 39 mcg/mL Vancomycin trough 5/23 at 0504 = 30 mcg/mL Vanc adj to '1750mg'$  IV q24h  5/24 Vanc random'@2126'$ = 15 mcg/ml   No chg  5/27 Vanc '1750mg'$  hung'@2222'$  -infuse over 2 hrs         5/28   Vanc P'@0023'$ =38  mcg/ml         5/28   Vanc T'@2044'$ =10 mcg/ml        -Vanc adj to 2000 mg IV q24h  6/1 vancomycin 2gm IV q24h given at 22:19 6/2 vanco peak = 26 mcg/ml at  02:02 6/2 random vancomycin level = 12.0 at 18:29 True Cmax = 28.2, Cmin = 10 and AUC = 425   6/7: vancomycin trough 12 mcg/mL at 2138 =  on vancomycin 2gm IV q24h     Microbiology results: 5/20 BCx: BCID 4 of 4 vials MRSA 5/22 BCx: S aureus, MRSA 5/25: Bcx: L hand 2150: NG          Bcx: L hand 2204: +Staph aureus  5/20 Sputum: MRSA 5/20 Flu/Cov PCR: negative    Thank you for allowing pharmacy to be a part of this patient's care.  Doreene Eland,  PharmD, BCPS, [place] Work Cell: [phone] 10/11/2021 1:24 PM

## 2021-10-12 ENCOUNTER — Inpatient Hospital Stay: Payer: Self-pay

## 2021-10-12 DIAGNOSIS — R6520 Severe sepsis without septic shock: Secondary | ICD-10-CM | POA: Diagnosis not present

## 2021-10-12 DIAGNOSIS — I50812 Chronic right heart failure: Secondary | ICD-10-CM | POA: Diagnosis not present

## 2021-10-12 DIAGNOSIS — J189 Pneumonia, unspecified organism: Secondary | ICD-10-CM | POA: Diagnosis not present

## 2021-10-12 DIAGNOSIS — A419 Sepsis, unspecified organism: Secondary | ICD-10-CM | POA: Diagnosis not present

## 2021-10-12 DIAGNOSIS — D75838 Other thrombocytosis: Secondary | ICD-10-CM

## 2021-10-12 LAB — CBC
HCT: 29.2 % — ABNORMAL LOW (ref 36.0–46.0)
Hemoglobin: 8.6 g/dL — ABNORMAL LOW (ref 12.0–15.0)
MCH: 23.1 pg — ABNORMAL LOW (ref 26.0–34.0)
MCHC: 29.5 g/dL — ABNORMAL LOW (ref 30.0–36.0)
MCV: 78.3 fL — ABNORMAL LOW (ref 80.0–100.0)
Platelets: 403 10*3/uL — ABNORMAL HIGH (ref 150–400)
RBC: 3.73 MIL/uL — ABNORMAL LOW (ref 3.87–5.11)
RDW: 24.2 % — ABNORMAL HIGH (ref 11.5–15.5)
WBC: 4.2 10*3/uL (ref 4.0–10.5)
nRBC: 0 % (ref 0.0–0.2)

## 2021-10-12 LAB — BASIC METABOLIC PANEL
Anion gap: 5 (ref 5–15)
BUN: 8 mg/dL (ref 6–20)
CO2: 25 mmol/L (ref 22–32)
Calcium: 8.8 mg/dL — ABNORMAL LOW (ref 8.9–10.3)
Chloride: 108 mmol/L (ref 98–111)
Creatinine, Ser: 0.45 mg/dL (ref 0.44–1.00)
GFR, Estimated: >60 mL/min (ref >60)
Glucose, Bld: 90 mg/dL (ref 70–99)
Potassium: 3.6 mmol/L (ref 3.5–5.1)
Sodium: 138 mmol/L (ref 135–145)

## 2021-10-12 NOTE — TOC Progression Note (Signed)
Transition of Care [facility]) - Progression Note    Patient Details  Name: Iriel Nason MRN: [id number] Date of Birth: 1960-11-01  [facility] CM/SW Contact  Anselm Pancoast, RN Phone Number: 10/12/2021, 5:12 PM  Clinical Narrative:    Spoke with patient who reports she has confirmed her hospital bed will be delivered tomorrow afternoon. Reports she has a friend who will be there to let them in and they are coming to get her signature for billing. Patient reports she was told they would be placing PICC line tomorrow and that the staff wanted to administer one dose of medication through it before she discharged. Discussed the current plan of discharge for Thursday afternoon if transportation able to be arranged. Unable to confirm time for PICC placement so TOC is unable to prearrange transportation for bariatric patient.    Expected Discharge Plan: Skilled Nursing Facility Barriers to Discharge: Continued Medical Work up, SNF Pending bed offer, Ship broker  Expected Discharge Plan and Services Expected Discharge Plan: [place]                                               Social Determinants of Health (SDOH) Interventions    Readmission Risk Interventions     No data to display

## 2021-10-12 NOTE — Care Management Important Message (Signed)
Important Message  Patient Details  Name: Krystal Francis MRN: [id number] Date of Birth: 1960/12/30   Medicare Important Message Given:  Yes     Juliann Pulse A Lean Fayson 10/12/2021, 1:43 PM

## 2021-10-12 NOTE — Progress Notes (Signed)
Nutrition Follow-up  DOCUMENTATION CODES:   Morbid obesity  INTERVENTION:   -Continue MVI with minerals daily -Continue double protein portions with meals  NUTRITION DIAGNOSIS:   Increased nutrient needs related to acute illness as evidenced by estimated needs.  Ongoing  GOAL:   Patient will meet greater than or equal to 90% of their needs  Progressing   MONITOR:   PO intake, Supplement acceptance  REASON FOR ASSESSMENT:   Malnutrition Screening Tool    ASSESSMENT:   Pt with medical history significant of RV failure, hypertension, GERD, PE on Lovenox, iron deficiency anemia, urinary retention with Foley catheter placement, uterine bleeding, morbid obesity with BMI 78.27, who presents with shortness of breath.  6/6- s/p TEE- no endocarditis  Reviewed I/O's: -600 ml x 24 hours and -880 ml since 09/28/21  Pt unavailable at time of visit. Attempted to speak with pt via call to hospital room phone, however, unable to reach.   Oral intake remains stable. Noted meal completions 20-100%. Pt has refused multiple supplements this hospitalization.   Per ID notes, plan for 6 weeks of IV vancomycin for MRSA bacteremia. Pt refusing SNF placement and is adamant to go home. Pt awaiting for hospital bed delivery and IV antibiotics training for home.   Medications reviewed and include magnesium oxide and senokot.   Labs reviewed.  Diet Order:   Diet Order             Diet Carb Modified Fluid consistency: Thin; Room service appropriate? Yes  Diet effective now                   EDUCATION NEEDS:   No education needs have been identified at this time  Skin:  Skin Assessment: Skin Integrity Issues: Skin Integrity Issues:: Other (Comment) Other: non-pressure wound L buttock  Last BM:  10/06/21 (type 5)  Height:   Ht Readings from Last 1 Encounters:  09/17/21 '5\' 4"'$  (1.626 m)    Weight:   Wt Readings from Last 1 Encounters:  10/12/21 (!) 232.2 kg    Ideal Body  Weight:  54.5 kg  BMI:  Body mass index is 87.88 kg/m.  Estimated Nutritional Needs:   Kcal:  1900-2100  Protein:  120-135 grams  Fluid:  > 1.9 L    Loistine Chance, RD, LDN, [place] Registered Dietitian II Certified Diabetes Care and Education Specialist Please refer to [facility] for RD and/or RD on-call/weekend/after hours pager

## 2021-10-12 NOTE — Progress Notes (Signed)
  Progress Note   Patient: Krystal Francis ZHY:[id number] DOB: 08-25-1960 DOA: 09/15/2021     25 DOS: the patient was seen and examined on 10/12/2021   Brief hospital course: Krystal Francis is a 61 y.o. female with medical history significant of RV failure, hypertension, GERD, PE on Lovenox, iron deficiency anemia, urinary retention with Foley catheter placement, uterine bleeding, morbid obesity with BMI 78.27, who presents with shortness of breath. Patient had a significant tachycardia, tachypnea, leukocytosis and hypoxemia with oxygen saturation 87%, she met severe sepsis criteria. Her blood culture positive for MRSA, CT angiogram showed multifocal pneumonia with evidence of pulmonary hypertension. She was seen by ID, vancomycin was started. Extensive occlusive thrombus RUE notes 05/23 on [place], vascular consulted. Thrombectomy done 05/31.  Patient was continued on Lovenox injection.  Assessment and Plan:  Multifocal pneumonia MRSA Bacteremia Severe sepsis. Patient condition had improved, has been evaluated by ID, continue vancomycin for duration of 6 weeks, end date 11/03/2021.  History of pulmonary embolism. Right upper extremity DVT. Status post thrombectomy right upper extremity. Continue Lovenox.  Right-sided congestive heart failure.  Acute on chronic. Severe pulmonary hypertension. Morbid obesity with BMI over 70. Patient condition had improved, continue torsemide 20 mg daily.  Hematuria Reactive thrombocytosis  Iron deficiency anemia Postmenopausal bleeding Condition has stabilized, on Provera 3 times daily. Patient be followed with [facility] at [facility] as scheduled.  Hypomagnesemia. Recheck levels tomorrow.     Subjective:  Condition has much improved, currently patient has refused SNF placement.  She has good appetite without nausea vomiting. No shortness of breath.  Physical Exam: Vitals:   10/11/21 2024 10/12/21 0500 10/12/21 0543 10/12/21 0840  BP: (!) 123/58  (!)  111/48 116/70  Pulse: 84  93 100  Resp: _0 Temp: 99.7 F (37.6 C)  98.5 F (36.9 C) 98.6 F (37 C)  TempSrc:      SpO2: 96%  91% 95%  Weight:  (!) 232.2 kg    Height:       General exam: Appears calm and comfortable.  Morbid obesity Respiratory system: Clear to auscultation. Respiratory effort normal. Cardiovascular system: S1 & S2 heard, RRR. No JVD, murmurs, rubs, gallops or clicks. No pedal edema. Gastrointestinal system: Abdomen is nondistended, soft and nontender. No organomegaly or masses felt. Normal bowel sounds heard. Central nervous system: Alert and oriented. No focal neurological deficits. Extremities: Symmetric 5 x 5 power. Skin: No rashes, lesions or ulcers Psychiatry: Judgement and insight appear normal. Mood & affect appropriate.   Data Reviewed:  Lab results reviewed.  Family Communication:   Disposition: Status is: Inpatient Remains inpatient appropriate because: severity of disease, iv treatment  Planned Discharge Destination: Home with Home Health    Time spent: 35 minutes  Author: Sharen Hones, MD 10/12/2021 1:13 PM  For on call review [email or web address].

## 2021-10-12 NOTE — Progress Notes (Signed)
PICC order received. Primary RN aware that PICC will be placed in AM prior to discharge. Patient has working PIV.

## 2021-10-12 NOTE — Consult Note (Signed)
Pharmacy Antibiotic Note  Krystal Francis is a 61 y.o. female w/ h/o RV failure, HTN, GERD, PE PTA on Lovenox, iron deficiency anemia, urinary retention with Foley catheter placement, uterine bleeding, morbid obesity with BMI 78.27, who presents with shortness of breath > w/u c/w PE & multifocal PNA. Pt admitted on 09/15/2021 with pneumonia and bacteremia & BCID resulted with MRSA 4 of 4 vials on 5/20.  Pharmacy has been consulted for escalation of abx to Vancomycin dosing.   -planning 6 weeks IV Vancomycin per ID Renal: SCr stable Repeat blood cxs 5/25: MRSE Repeat blood cx 5/30 MRSE (contaminant) 6/6 TEE: neg for endocarditis  6/13: vancomycin trough =  10  mcg/mL at 21:41  on vancomycin 2gm IV q24h (prev dose given at 21:00)  Previous Vancomycin levels: 6/1 vancomycin 2gm IV q24h given at 22:19 6/2 vanco peak = 26 mcg/ml at  02:02 6/2 random vancomycin level = 12.0 at 18:29 True Cmax = 28.2, Cmin = 10 and AUC = 425   Plan:    Continue vancomycin  to 2000 mg IV every 24 hours based on vancomycin trough last evening. This was a 24.5h leve as previous days dose was hung ~1h early.   Based on previous levels, AUC still within goal.  Follow renal function  See OPAT orders   Height: '5\' 4"'$  (162.6 cm) Weight: (!) 232.2 kg (512 lb) IBW/kg (Calculated) : 54.7  Temp (24hrs), Avg:98.7 F (37.1 C), Min:97.9 F (36.6 C), Max:99.7 F (37.6 C)  Recent Labs  Lab 10/05/21 2138 10/06/21 0719 10/07/21 0637 10/09/21 0556 10/10/21 0602 10/11/21 2141 10/12/21 0454  WBC  --  4.3 4.3 5.0 4.5  --  4.2  CREATININE  --  0.44  --  0.38* 0.33*  --  0.45  VANCOTROUGH 12*  --   --   --   --  10*  --      Estimated Creatinine Clearance: 150.3 mL/min (by C-G formula based on SCr of 0.45 mg/dL).    Allergies  Allergen Reactions   Furosemide Rash    Per [facility] records - rash possibly to IV furosemide but tablets ok   Chlorhexidine Hives, Dermatitis, Rash and Other (See Comments)    Blisters right after  CHG wipe down    Antimicrobials this admission: Azithro (5/20-5/21) CRO x1 in ED (5/20) VAN (5/20 >>   Dose adjustments this admission: 5/22: Vancomycin levels  on 1gm IV q12h (with 5th maintenance dose Vancomycin peak 5/22 at 2141 = 39 mcg/mL Vancomycin trough 5/23 at 0504 = 30 mcg/mL Vanc adj to '1750mg'$  IV q24h  5/24 Vanc random'@2126'$ = 15 mcg/ml   No chg  5/27 Vanc '1750mg'$  hung'@2222'$  -infuse over 2 hrs         5/28   Vanc P'@0023'$ =38  mcg/ml         5/28   Vanc T'@2044'$ =10 mcg/ml        -Vanc adj to 2000 mg IV q24h  6/1 vancomycin 2gm IV q24h given at 22:19 6/2 vanco peak = 26 mcg/ml at  02:02 6/2 random vancomycin level = 12.0 at 18:29 True Cmax = 28.2, Cmin = 10 and AUC = 425   6/7: vancomycin trough 12 mcg/mL at 2138 =  on vancomycin 2gm IV q24h     Microbiology results: 5/20 BCx: BCID 4 of 4 vials MRSA 5/22 BCx: S aureus, MRSA 5/25: Bcx: L hand 2150: NG          Bcx: L hand 2204: +Staph aureus  5/20 Sputum:  MRSA 5/20 Flu/Cov PCR: negative    Thank you for allowing pharmacy to be a part of this patient's care.  Doreene Eland, PharmD, BCPS, BCIDP Work Cell: [phone] 10/12/2021 10:17 AM

## 2021-10-13 DIAGNOSIS — A419 Sepsis, unspecified organism: Secondary | ICD-10-CM | POA: Diagnosis not present

## 2021-10-13 DIAGNOSIS — R6520 Severe sepsis without septic shock: Secondary | ICD-10-CM | POA: Diagnosis not present

## 2021-10-13 DIAGNOSIS — I50810 Right heart failure, unspecified: Secondary | ICD-10-CM

## 2021-10-13 DIAGNOSIS — J189 Pneumonia, unspecified organism: Secondary | ICD-10-CM | POA: Diagnosis not present

## 2021-10-13 DIAGNOSIS — R7881 Bacteremia: Secondary | ICD-10-CM | POA: Diagnosis not present

## 2021-10-13 DIAGNOSIS — I110 Hypertensive heart disease with heart failure: Secondary | ICD-10-CM

## 2021-10-13 LAB — BASIC METABOLIC PANEL
Anion gap: 5 (ref 5–15)
BUN: 9 mg/dL (ref 6–20)
CO2: 23 mmol/L (ref 22–32)
Calcium: 9 mg/dL (ref 8.9–10.3)
Chloride: 108 mmol/L (ref 98–111)
Creatinine, Ser: 0.42 mg/dL — ABNORMAL LOW (ref 0.44–1.00)
GFR, Estimated: >60 mL/min (ref >60)
Glucose, Bld: 100 mg/dL — ABNORMAL HIGH (ref 70–99)
Potassium: 3.7 mmol/L (ref 3.5–5.1)
Sodium: 136 mmol/L (ref 135–145)

## 2021-10-13 LAB — CBC
HCT: 30.4 % — ABNORMAL LOW (ref 36.0–46.0)
Hemoglobin: 8.9 g/dL — ABNORMAL LOW (ref 12.0–15.0)
MCH: 22.5 pg — ABNORMAL LOW (ref 26.0–34.0)
MCHC: 29.3 g/dL — ABNORMAL LOW (ref 30.0–36.0)
MCV: 76.8 fL — ABNORMAL LOW (ref 80.0–100.0)
Platelets: 450 10*3/uL — ABNORMAL HIGH (ref 150–400)
RBC: 3.96 MIL/uL (ref 3.87–5.11)
RDW: 24.2 % — ABNORMAL HIGH (ref 11.5–15.5)
WBC: 4.4 10*3/uL (ref 4.0–10.5)
nRBC: 0 % (ref 0.0–0.2)

## 2021-10-13 LAB — MAGNESIUM: Magnesium: 1.5 mg/dL — ABNORMAL LOW (ref 1.7–2.4)

## 2021-10-13 MED ORDER — MAGNESIUM SULFATE 4 GM/100ML IV SOLN
4.0000 g | Freq: Once | INTRAVENOUS | Status: AC
Start: 2021-10-13 — End: 2021-10-13
  Administered 2021-10-13: 4 g via INTRAVENOUS
  Filled 2021-10-13: qty 100

## 2021-10-13 MED ORDER — MEDROXYPROGESTERONE ACETATE 10 MG PO TABS: 10.0000 mg | ORAL_TABLET | Freq: Three times a day (TID) | ORAL | Status: AC

## 2021-10-13 MED ORDER — VANCOMYCIN IV (FOR PTA / DISCHARGE USE ONLY): 2000.0000 mg | INTRAVENOUS | 0 refills | Status: DC

## 2021-10-13 MED ORDER — SODIUM CHLORIDE 0.9% FLUSH: 10.0000 mL | INTRAVENOUS | Status: DC | PRN

## 2021-10-13 MED ORDER — POLYETHYLENE GLYCOL 3350 17 G PO PACK
17.0000 g | PACK | Freq: Two times a day (BID) | ORAL | 0 refills | Status: AC | PRN
Start: 2021-10-13 — End: ?

## 2021-10-13 MED ORDER — VANCOMYCIN IV (FOR PTA / DISCHARGE USE ONLY): 2000.0000 mg | INTRAVENOUS | 0 refills | Status: AC

## 2021-10-13 MED ORDER — SODIUM CHLORIDE 0.9% FLUSH
10.0000 mL | Freq: Two times a day (BID) | INTRAVENOUS | Status: DC
  Administered 2021-10-13: 10 mL

## 2021-10-13 NOTE — TOC Transition Note (Addendum)
Transition of Care [facility]) - CM/SW Discharge Note   Patient Details  Name: Krystal Francis MRN: [id number] Date of Birth: [birth year]  [facility] CM/SW Contact:  Anselm Pancoast, RN Phone Number: 10/13/2021, 2:23 PM   Clinical Narrative:    Bariatric EMS transport arranged for 1630 to allow time for Vanco to complete. Patient reporting bed is scheduled for delivery today. Treatment team updated on transport time.   Notified patient who confirmed bed would be set up at that time. No other needs or concerns.    Final next level of care: Skilled Nursing Facility Barriers to Discharge: Continued Medical Work up, SNF Pending bed offer, Insurance Authorization   Patient Goals and CMS Choice        Discharge Placement                       Discharge Plan and Services                                     Social Determinants of Health (SDOH) Interventions     Readmission Risk Interventions     No data to display

## 2021-10-13 NOTE — Progress Notes (Signed)
The pt verbalized that [place] will be delivering the hospital bed this afternoon to her house.

## 2021-10-13 NOTE — Discharge Instructions (Addendum)
Follow-up with PCP and infectious disease as scheduled. Follow-up with your OB/GYN in [place] [facility] (IV infusion)

## 2021-10-13 NOTE — Progress Notes (Signed)
EMS present for pt discharge; discharge packet given to EMS personnel to take to the pt's residence; pt 6 person assist out of specialty bed onto EMS stretcher; pt discharged via stretcher by EMS to home with home health services

## 2021-10-13 NOTE — Progress Notes (Signed)
Peripherally Inserted Central Catheter Placement  The IV Nurse has discussed with the patient and/or persons authorized to consent for the patient, the purpose of this procedure and the potential benefits and risks involved with this procedure.  The benefits include less needle sticks, lab draws from the catheter, and the patient may be discharged home with the catheter. Risks include, but not limited to, infection, bleeding, blood clot (thrombus formation), and puncture of an artery; nerve damage and irregular heartbeat and possibility to perform a PICC exchange if needed/ordered by physician.  Alternatives to this procedure were also discussed.  Bard Power PICC patient education guide, fact sheet on infection prevention and patient information card has been provided to patient /or left at bedside.    PICC Placement Documentation  PICC Single Lumen 10/13/21 Left Brachial 48 cm 0 cm (Active)  Indication for Insertion or Continuance of Line Home intravenous therapies (PICC only) 10/13/21 0859  Exposed Catheter (cm) 0 cm 10/13/21 0859  Site Assessment Clean, Dry, Intact 10/13/21 0859  Line Status Flushed;Saline locked;Blood return noted 10/13/21 0859  Dressing Type Transparent;Securing device 10/13/21 0859  Dressing Status Antimicrobial disc in place;Clean, Dry, Intact 10/13/21 0859  Safety Lock Not Applicable 70/01/74 9449  Line Care Tubing changed;Cap changed;Connections checked and tightened 10/13/21 0859  Line Adjustment (NICU/IV Team Only) No 10/13/21 0859  Dressing Intervention New dressing 10/13/21 0859  Dressing Change Due 10/20/21 10/13/21 0859       Rolena Infante 10/13/2021, 9:00 AM

## 2021-10-13 NOTE — Plan of Care (Signed)
Problem: Education: Goal: Knowledge of General Education information will improve Description: Including pain rating scale, medication(s)/side effects and non-pharmacologic comfort measures 10/13/2021 1533 by Oris Drone, RN Outcome: Adequate for Discharge 10/13/2021 1533 by Oris Drone, RN Outcome: Progressing   Problem: Health Behavior/Discharge Planning: Goal: Ability to manage health-related needs will improve 10/13/2021 1533 by Oris Drone, RN Outcome: Adequate for Discharge 10/13/2021 1533 by Oris Drone, RN Outcome: Progressing   Problem: Clinical Measurements: Goal: Ability to maintain clinical measurements within normal limits will improve 10/13/2021 1533 by Oris Drone, RN Outcome: Adequate for Discharge 10/13/2021 1533 by Oris Drone, RN Outcome: Progressing Goal: Will remain free from infection 10/13/2021 1533 by Oris Drone, RN Outcome: Adequate for Discharge 10/13/2021 1533 by Oris Drone, RN Outcome: Progressing Goal: Diagnostic test results will improve 10/13/2021 1533 by Oris Drone, RN Outcome: Adequate for Discharge 10/13/2021 1533 by Oris Drone, RN Outcome: Progressing Goal: Respiratory complications will improve 10/13/2021 1533 by Oris Drone, RN Outcome: Adequate for Discharge 10/13/2021 1533 by Oris Drone, RN Outcome: Progressing Goal: Cardiovascular complication will be avoided 10/13/2021 1533 by Oris Drone, RN Outcome: Adequate for Discharge 10/13/2021 1533 by Oris Drone, RN Outcome: Progressing   Problem: Activity: Goal: Risk for activity intolerance will decrease 10/13/2021 1533 by Oris Drone, RN Outcome: Adequate for Discharge 10/13/2021 1533 by Oris Drone, RN Outcome: Progressing   Problem: Nutrition: Goal: Adequate nutrition will be maintained 10/13/2021 1533 by Oris Drone, RN Outcome: Adequate for Discharge 10/13/2021 1533 by  Oris Drone, RN Outcome: Progressing   Problem: Coping: Goal: Level of anxiety will decrease 10/13/2021 1533 by Oris Drone, RN Outcome: Adequate for Discharge 10/13/2021 1533 by Oris Drone, RN Outcome: Progressing   Problem: Elimination: Goal: Will not experience complications related to bowel motility 10/13/2021 1533 by Oris Drone, RN Outcome: Adequate for Discharge 10/13/2021 1533 by Oris Drone, RN Outcome: Progressing Goal: Will not experience complications related to urinary retention 10/13/2021 1533 by Oris Drone, RN Outcome: Adequate for Discharge 10/13/2021 1533 by Oris Drone, RN Outcome: Progressing   Problem: Pain Managment: Goal: General experience of comfort will improve 10/13/2021 1533 by Oris Drone, RN Outcome: Adequate for Discharge 10/13/2021 1533 by Oris Drone, RN Outcome: Progressing   Problem: Safety: Goal: Ability to remain free from injury will improve 10/13/2021 1533 by Oris Drone, RN Outcome: Adequate for Discharge 10/13/2021 1533 by Oris Drone, RN Outcome: Progressing   Problem: Skin Integrity: Goal: Risk for impaired skin integrity will decrease 10/13/2021 1533 by Oris Drone, RN Outcome: Adequate for Discharge 10/13/2021 1533 by Oris Drone, RN Outcome: Progressing   Problem: Activity: Goal: Ability to tolerate increased activity will improve 10/13/2021 1533 by Oris Drone, RN Outcome: Adequate for Discharge 10/13/2021 1533 by Oris Drone, RN Outcome: Progressing   Problem: Clinical Measurements: Goal: Ability to maintain a body temperature in the normal range will improve 10/13/2021 1533 by Oris Drone, RN Outcome: Adequate for Discharge 10/13/2021 1533 by Oris Drone, RN Outcome: Progressing   Problem: Respiratory: Goal: Ability to maintain adequate ventilation will improve 10/13/2021 1533 by Oris Drone,  RN Outcome: Adequate for Discharge 10/13/2021 1533 by Oris Drone, RN Outcome: Progressing Goal: Ability to maintain a clear airway will improve 10/13/2021 1533 by Oris Drone, RN Outcome: Adequate for Discharge 10/13/2021 1533 by Oris Drone, RN Outcome: Progressing  Problem: Education: Goal: Ability to demonstrate management of disease process will improve 10/13/2021 1533 by Oris Drone, RN Outcome: Adequate for Discharge 10/13/2021 1533 by Oris Drone, RN Outcome: Progressing Goal: Ability to verbalize understanding of medication therapies will improve 10/13/2021 1533 by Oris Drone, RN Outcome: Adequate for Discharge 10/13/2021 1533 by Oris Drone, RN Outcome: Progressing Goal: Individualized Educational Video(s) 10/13/2021 1533 by Oris Drone, RN Outcome: Adequate for Discharge 10/13/2021 1533 by Oris Drone, RN Outcome: Progressing   Problem: Activity: Goal: Capacity to carry out activities will improve 10/13/2021 1533 by Oris Drone, RN Outcome: Adequate for Discharge 10/13/2021 1533 by Oris Drone, RN Outcome: Progressing   Problem: Cardiac: Goal: Ability to achieve and maintain adequate cardiopulmonary perfusion will improve 10/13/2021 1533 by Oris Drone, RN Outcome: Adequate for Discharge 10/13/2021 1533 by Oris Drone, RN Outcome: Progressing

## 2021-10-13 NOTE — Progress Notes (Addendum)
MD order received in [facility] to discharge pt home with home health and home IV infusion today; PICC line previously placed per [facility] documentation in pt's left brachial, currently saline locked, pt will discharge home with PICC line in place; [facility] previously established Home Health services with [facility], home IV infusion services with [facility]; pt having specialty bed personally delivered to her residence this afternoon; verbally reviewed AVS with pt, no questions voiced at this time; pt's discharge previously arranged by [facility] for EMS to come at 1630 with a bariatric stretcher for nonemergency transport

## 2021-10-13 NOTE — Progress Notes (Signed)
CHG bath should have been ordered for PICC line previously inserted by IV team at 0900am; unable to order CHG bath due to pt allergy to chlorhexidine in [facility]

## 2021-10-13 NOTE — Discharge Summary (Signed)
Physician Discharge Summary   Patient: Krystal Francis MRN: [id number] DOB: 1960/11/28  Admit date:     09/15/2021  Discharge date: 10/13/21  Discharge Physician: Sharen Hones   PCP: Latanya Maudlin, NP   Recommendations at discharge:   Follow-up with PCP in 1 week. Follow-up with ID in 1 to 2 weeks. Follow-up with [facility] for vaginal bleeding  Discharge Diagnoses: Principal Problem:   Multifocal pneumonia Active Problems:   Severe sepsis ([place])   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HCC)   Pulmonary embolism (HCC)   HTN (hypertension)   Urinary retention   Iron deficiency anemia   Hematuria   Morbid obesity with body mass index of 70 and over in adult [facility])   MRSA bacteremia   Hypomagnesemia   Acute hypoxemic respiratory failure (HCC)   Arterial hypotension   Palliative care by specialist   Goals of care, counseling/discussion   Postmenopausal bleeding   Reactive thrombocytosis  Resolved Problems:   * No resolved hospital problems. *  Hospital Course: Krystal Francis is a 61 y.o. female with medical history significant of RV failure, hypertension, GERD, PE on Lovenox, iron deficiency anemia, urinary retention with Foley catheter placement, uterine bleeding, morbid obesity with BMI 78.27, who presents with shortness of breath. Patient had a significant tachycardia, tachypnea, leukocytosis and hypoxemia with oxygen saturation 87%, she met severe sepsis criteria. Her blood culture positive for MRSA, CT angiogram showed multifocal pneumonia with evidence of pulmonary hypertension. She was seen by ID, vancomycin was started. Extensive occlusive thrombus RUE notes 05/23 on [place], vascular consulted. Thrombectomy done 05/31.  Patient was continued on Lovenox injection.  Assessment and Plan: Multifocal pneumonia secondary to MRSA MRSA septicemia Severe sepsis. Patient condition had improved, has been evaluated by ID, continue vancomycin for duration of 6 weeks, end date  11/03/2021. Patient will also be followed by PCP and ID as outpatient.   History of pulmonary embolism. Right upper extremity DVT. Status post thrombectomy right upper extremity. Continue Lovenox.  Right-sided congestive heart failure.  Acute on chronic. Severe pulmonary hypertension. Morbid obesity with BMI over 70. Patient condition had improved, continue torsemide 20 mg daily.  Hematuria Reactive thrombocytosis  Iron deficiency anemia Postmenopausal bleeding Condition has stabilized, on Provera 3 times daily. Patient be followed with [facility] at [facility] as scheduled.   Hypomagnesemia. Patient will be given 4 g magnesium sulfate for mag level of 1.6.       Consultants: ID Procedures performed: None  Disposition: Home health Diet recommendation:  Discharge Diet Orders (From admission, onward)     Start     Ordered   10/13/21 0000  Diet - low sodium heart healthy        10/13/21 1014           Cardiac diet DISCHARGE MEDICATION: Allergies as of 10/13/2021       Reactions   Furosemide Rash   Per [facility] records - rash possibly to IV furosemide but tablets ok   Chlorhexidine Hives, Dermatitis, Rash, Other (See Comments)   Blisters right after CHG wipe down        Medication List     STOP taking these medications    furosemide 40 MG tablet Commonly known as: LASIX       TAKE these medications    enoxaparin 120 MG/0.8ML injection Commonly known as: LOVENOX Inject 120 mg into the skin daily.   ferrous sulfate 325 (65 FE) MG EC tablet Take 1 tablet (325 mg total) by mouth 3 (three)  times daily with meals.   magnesium oxide 400 (240 Mg) MG tablet Commonly known as: MAG-OX Take 1 tablet by mouth daily.   medroxyPROGESTERone 10 MG tablet Commonly known as: PROVERA Take 1 tablet (10 mg total) by mouth 3 (three) times daily. What changed: when to take this   pantoprazole 40 MG tablet Commonly known as: PROTONIX Take 40 mg by mouth daily.    polyethylene glycol 17 g packet Commonly known as: MIRALAX / GLYCOLAX Take 17 g by mouth 2 (two) times daily as needed for mild constipation or moderate constipation.   torsemide 20 MG tablet Commonly known as: DEMADEX Take 20 mg by mouth daily.   vancomycin  IVPB Inject 2,000 mg into the vein daily. Indication:  MRSA bacteremia and MRSA PNA First Dose: No Last Day of Therapy:  11/19/21 Labs - Monday:  CBC/D, CMP, and vancomycin trough (goal 15-17). Labs - Thursday BMP, vancomycin trough Method of administration:Elastomeric Method of administration may be changed at the discretion of the patient and/or caregiver's ability to self-administer the medication ordered.  Please pull PIC at completion of IV antibiotics Fax weekly labs to [phone] Call [phone] with any questions or concerns   vitamin B-12 1000 MCG tablet Commonly known as: CYANOCOBALAMIN Take by mouth.               Discharge Care Instructions  (From admission, onward)           Start     Ordered   10/13/21 0000  Change dressing on IV access line weekly and PRN  (Home infusion instructions - [facility] )        10/13/21 1013   10/13/21 0000  Discharge wound care:       Comments: Follow with [facility] RN   10/13/21 1014            Follow-up Information     Latanya Maudlin, NP Follow up in 1 week(s).   Specialty: Family Medicine Contact information: [place] [address] [phone]         Tsosie Billing, MD Follow up in 2 week(s).   Specialty: Infectious Diseases Why: as scheduled Contact information: [place] [address] [phone]                Discharge Exam: Filed Weights   10/11/21 0500 10/12/21 0500 10/13/21 0500  Weight: (!) 233.6 kg (!) 232.2 kg (!) 233.1 kg   General exam: Appears calm and comfortable, morbid obese Respiratory system: Clear to auscultation. Respiratory effort  normal. Cardiovascular system: S1 & S2 heard, RRR. No JVD, murmurs, rubs, gallops or clicks. No pedal edema. Gastrointestinal system: Abdomen is nondistended, soft and nontender. No organomegaly or masses felt. Normal bowel sounds heard. Central nervous system: Alert and oriented. No focal neurological deficits. Extremities: Symmetric 5 x 5 power. Skin: No rashes, lesions or ulcers Psychiatry: Judgement and insight appear normal. Mood & affect appropriate.    Condition at discharge: good  The results of significant diagnostics from this hospitalization (including imaging, microbiology, ancillary and laboratory) are listed below for reference.   Imaging Studies: [place] EKG SITE RITE  Result Date: 10/12/2021 If Site Rite image not attached, placement could not be confirmed due to current cardiac rhythm.  ECHO TEE  Result Date: 10/04/2021    TRANSESOPHOGEAL ECHO REPORT   Patient Name:   Shelma Wertheim Date of Exam: 10/04/2021 Medical Rec #:  [id number]  Height:  64.0 in Accession #:    [id number] Weight:       339.5 lb Date of Birth:  10-03-1960 BSA:          2.450 m Patient Age:    81 years   BP:           101/53 mmHg Patient Gender: F          HR:           90 bpm. Exam Location:  [facility] Procedure: Transesophageal Echo, Color Doppler and Cardiac Doppler Indications:     Not listed on TEE check-in sheet  History:         Patient has prior history of Echocardiogram examinations, most                  recent 09/21/2021. Risk Factors:Hypertension. Anemia.  Sonographer:     Sherrie Sport Referring Phys:  [place] Diagnosing Phys: Serafina Royals MD PROCEDURE: The transesophogeal probe was passed without difficulty through the esophogus of the patient. Sedation performed by performing physician. The patient developed no complications during the procedure. IMPRESSIONS  1. Left ventricular ejection fraction, by estimation, is 60 to 65%. The left ventricle has normal function. The left ventricle has no  regional wall motion abnormalities.  2. Right ventricular systolic function is normal. The right ventricular size is normal.  3. No left atrial/left atrial appendage thrombus was detected.  4. The mitral valve is normal in structure. Trivial mitral valve regurgitation.  5. The aortic valve is normal in structure. Aortic valve regurgitation is not visualized.  6. Agitated saline contrast bubble study was negative, with no evidence of any interatrial shunt. FINDINGS  Left Ventricle: Left ventricular ejection fraction, by estimation, is 60 to 65%. The left ventricle has normal function. The left ventricle has no regional wall motion abnormalities. The left ventricular internal cavity size was normal in size. Right Ventricle: The right ventricular size is normal. No increase in right ventricular wall thickness. Right ventricular systolic function is normal. Left Atrium: Left atrial size was normal in size. No left atrial/left atrial appendage thrombus was detected. Right Atrium: Right atrial size was normal in size. Pericardium: There is no evidence of pericardial effusion. Mitral Valve: The mitral valve is normal in structure. Trivial mitral valve regurgitation. There is no evidence of mitral valve vegetation. Tricuspid Valve: The tricuspid valve is normal in structure. Tricuspid valve regurgitation is trivial. There is no evidence of tricuspid valve vegetation. Aortic Valve: The aortic valve is normal in structure. Aortic valve regurgitation is not visualized. There is no evidence of aortic valve vegetation. Pulmonic Valve: The pulmonic valve was normal in structure. Pulmonic valve regurgitation is trivial. There is no evidence of pulmonic valve vegetation. Aorta: The aortic root and ascending aorta are structurally normal, with no evidence of dilitation. There is minimal (Grade I) plaque. IAS/Shunts: The interatrial septum is aneurysmal. No atrial level shunt detected by color flow Doppler. Agitated saline contrast  was given intravenously to evaluate for intracardiac shunting. Agitated saline contrast bubble study was negative, with no evidence of any interatrial shunt. Serafina Royals MD Electronically signed by Serafina Royals MD Signature Date/Time: 10/04/2021/1:30:19 PM    Final    DG Chest Port 1 View  Result Date: 09/29/2021 CLINICAL DATA:  Pneumonia. EXAM: PORTABLE CHEST 1 VIEW COMPARISON:  CTA chest 09/17/2021 FINDINGS: Cardiac silhouette is again mildly to moderately enlarged. Mediastinal contours within normal limits. Mild bilateral interstitial thickening. There patchy heterogeneous bilateral airspace opacities that  are persistent but likely slightly improved from 09/17/2021 frontal scout view for prior chest CT. No pleural effusion or pneumothorax. IMPRESSION: 1. Stable mild cardiomegaly. 2. Bilateral patchy heterogeneous airspace opacities again suspicious for multifocal infection. These may be slightly improved from prior 09/17/2021 CT, however differences in modality limit comparison. Electronically Signed   By: Yvonne Kendall M.D.   On: 09/29/2021 09:05   DG C-Arm 1-60 Min-No Report  Result Date: 09/28/2021 Fluoroscopy was utilized by the requesting physician.  No radiographic interpretation.   PERIPHERAL VASCULAR CATHETERIZATION  Result Date: 09/28/2021 See surgical note for result.  ECHOCARDIOGRAM COMPLETE  Result Date: 09/21/2021    ECHOCARDIOGRAM REPORT   Patient Name:   Kem Sanchez Date of Exam: 09/21/2021 Medical Rec #:  [id number]  Height:       64.0 in Accession #:    [id number] Weight:       459.0 lb Date of Birth:  [birth year] BSA:          2.785 m Patient Age:    18 years   BP:           126/57 mmHg Patient Gender: F          HR:           78 bpm. Exam Location:  [facility] Procedure: 2D Echo, Cardiac Doppler and Color Doppler Indications:     Endocarditis I38  History:         Patient has no prior history of Echocardiogram examinations.                  Risk Factors:Hypertension.  Sonographer:      Sherrie Sport Referring Phys:  [id number] Sharen Hones Diagnosing Phys: Kate Sable MD  Sonographer Comments: Technically challenging study due to limited acoustic windows, no apical window and no subcostal window. IMPRESSIONS  1. Left ventricular ejection fraction, by estimation, is 60 to 65%. The left ventricle has normal function. The left ventricle has no regional wall motion abnormalities. Left ventricular diastolic function could not be evaluated.  2. Right ventricular systolic function is normal. The right ventricular size is not well visualized.  3. The mitral valve is normal in structure. No evidence of mitral valve regurgitation.  4. The aortic valve is tricuspid. Aortic valve regurgitation is not visualized. FINDINGS  Left Ventricle: Left ventricular ejection fraction, by estimation, is 60 to 65%. The left ventricle has normal function. The left ventricle has no regional wall motion abnormalities. The left ventricular internal cavity size was normal in size. There is  no left ventricular hypertrophy. Left ventricular diastolic function could not be evaluated. Right Ventricle: The right ventricular size is not well visualized. No increase in right ventricular wall thickness. Right ventricular systolic function is normal. Left Atrium: Left atrial size was normal in size. Right Atrium: Right atrial size was not well visualized. Pericardium: There is no evidence of pericardial effusion. Mitral Valve: The mitral valve is normal in structure. No evidence of mitral valve regurgitation. Tricuspid Valve: The tricuspid valve is normal in structure. Tricuspid valve regurgitation is mild. Aortic Valve: The aortic valve is tricuspid. Aortic valve regurgitation is not visualized. Pulmonic Valve: The pulmonic valve was not well visualized. Pulmonic valve regurgitation is not visualized. Aorta: The aortic root is normal in size and structure. Venous: The inferior vena cava was not well visualized. IAS/Shunts: The  interatrial septum was not well visualized.  LEFT VENTRICLE PLAX 2D LVIDd:         4.90 cm LVIDs:  3.10 cm LV PW:         1.30 cm LV IVS:        0.70 cm  LEFT ATRIUM         Index LA diam:    4.20 cm 1.51 cm/m                        PULMONIC VALVE AORTA                 PV Vmax:        0.75 m/s Ao Root diam: 3.30 cm PV Vmean:       45.200 cm/s                       PV VTI:         0.138 m                       PV Peak grad:   2.3 mmHg                       PV Mean grad:   1.0 mmHg                       RVOT Peak grad: 4 mmHg  TRICUSPID VALVE TR Peak grad:   17.0 mmHg TR Vmax:        206.00 cm/s  SHUNTS Pulmonic VTI: 0.197 m Kate Sable MD Electronically signed by Kate Sable MD Signature Date/Time: 09/21/2021/5:09:53 PM    Final    US Venous Img Upper Uni Right(DVT)  Result Date: 09/20/2021 CLINICAL DATA:  Right arm edema. EXAM: RIGHT UPPER EXTREMITY VENOUS DOPPLER ULTRASOUND TECHNIQUE: Gray-scale sonography with graded compression, as well as color Doppler and duplex ultrasound were performed to evaluate the upper extremity deep venous system from the level of the subclavian vein and including the jugular, axillary, basilic, radial, ulnar and upper cephalic vein. Spectral Doppler was utilized to evaluate flow at rest and with distal augmentation maneuvers. COMPARISON:  None Available. FINDINGS: Contralateral Subclavian Vein: Respiratory phasicity is normal and symmetric with the symptomatic side. No evidence of thrombus. Normal compressibility. Internal Jugular Vein: Occlusive thrombus. Subclavian Vein: Occlusive thrombus. Axillary Vein: Occlusive thrombus. Cephalic Vein: Proximal occlusive thrombus. Basilic Vein: Nearly occlusive thrombus with only trace residual flow evident. Brachial Veins: Occlusive thrombus. Radial Veins: Occlusive thrombus. Ulnar Veins: Diminutive but patent. IMPRESSION: 1. Extensive occlusive thrombus in the right upper extremity as above. These results will be called  to the ordering clinician or representative by the Radiologist Assistant, and communication documented in the PACS or [employer]. Electronically Signed   By: Misty Stanley M.D.   On: 09/20/2021 07:18   US ARTERIAL LOWER EXTREMITY DUPLEX LEFT (NON-ABI)  Result Date: 09/19/2021 CLINICAL DATA:  Left lower extremity pain.  Evaluate for PAD. EXAM: LEFT LOWER EXTREMITY ARTERIAL DUPLEX SCAN TECHNIQUE: Gray-scale sonography as well as color Doppler and duplex ultrasound was performed to evaluate the lower extremity arteries including the common, superficial and profunda femoral arteries, popliteal artery and calf arteries. COMPARISON:  None Available. FINDINGS: Left lower Extremity: Left Common femoral artery: Triphasic waveform; 201 cm/sec Normal waveform is demonstrated within the left common femoral artery however there is borderline elevated peak systolic velocity of 341 cm/sec, findings suggestive of potential inflow (aortoiliac disease). Left superficial femoral artery: Biphasic waveform Proximal: 109 cm/sec Mid: 118 cm/sec Distal: 81 cm/sec Left  deep femoral artery: Biphasic waveform; 90 cm/sec Left popliteal artery: Biphasic waveform; 78 cm/sec Biphasic waveforms are demonstrated throughout the interrogated portions of the left superficial, and deep femoral arteries as well as the popliteal artery. Velocity differences between the mid and distal aspects of the left superficial femoral artery suggests hemodynamically significant narrowing regional to this location. Left anterior tibial artery: Monophasic waveform; 18 cm/sec Left posterior tibial artery: Biphasic waveform; 97 cm/sec Tibial runoff is patent the level of the lower leg however blunted monophasic waveform is demonstrated within the left anterior tibial artery with associated markedly reduced velocity suggestive of atretic flow. Biphasic waveform is demonstrated within the left posterior tibial artery. IMPRESSION: Suspected hemodynamically  significant narrowings involving inflow, outflow and tibial runoff of the left lower extremity. Further evaluation with CTA run-off could be performed as indicated. Electronically Signed   By: Sandi Mariscal M.D.   On: 09/19/2021 10:36   CT Angio Chest PE W and/or Wo Contrast  Result Date: 09/17/2021 CLINICAL DATA:  Pulmonary embolism (PE) suspected, high prob. Hematuria, dyspnea EXAM: CT ANGIOGRAPHY CHEST WITH CONTRAST TECHNIQUE: Multidetector CT imaging of the chest was performed using the standard protocol during bolus administration of intravenous contrast. Multiplanar CT image reconstructions and MIPs were obtained to evaluate the vascular anatomy. RADIATION DOSE REDUCTION: This exam was performed according to the departmental dose-optimization program which includes automated exposure control, adjustment of the mA and/or kV according to patient size and/or use of iterative reconstruction technique. CONTRAST:  217m OMNIPAQUE IOHEXOL 350 MG/ML SOLN (due to requirement for second bolus) COMPARISON:  None Available. FINDINGS: Cardiovascular: Technically limited examination due to bolus timing, body habitus, and respiratory motion artifact. No intraluminal filling defect identified within the main, right, and left pulmonary arteries. Lobar, segmental, and subsegmental pulmonary arteries are not adequately opacified to definitively exclude the presence of acute pulmonary emboli. Central pulmonary arteries are enlarged in keeping with changes of pulmonary arterial hypertension. No significant coronary artery calcification. Global cardiac size within normal limits. No pericardial effusion. Mild atherosclerotic calcification within the thoracic aorta. No aortic aneurysm. Mediastinum/Nodes: There is right axillary adenopathy identified as well as numerous enhancing lymph nodes within the right axilla demonstrating a matted appearance with perinodal infiltration likely related to inflammation or nodal metastatic  disease. There is asymmetric edema within the right axilla noted. Right hilar adenopathy is present, possibly reactive in nature. Visualized thyroid is unremarkable. Esophagus is unremarkable. Lungs/Pleura: There is multifocal consolidation within the lungs bilaterally, in keeping with changes of acute multifocal pneumonia in the appropriate clinical setting. No pneumothorax or pleural effusion. Central airways are widely patent. Upper Abdomen: No acute abnormality. Musculoskeletal: No chest wall abnormality. No acute or significant osseous findings. Review of the MIP images confirms the above findings. IMPRESSION: Technically limited examination without evidence of large central pulmonary embolus. Lobar and peripheral pulmonary arterial vasculature is not adequately opacified on this examination to definitively exclude the presence of acute pulmonary embolus. Morphologic changes in keeping with pulmonary arterial hypertension. Multifocal pulmonary consolidation in keeping with multifocal pneumonic consolidation in the appropriate clinical setting. No central obstructing lesion. Probable reactive right hilar adenopathy. Enhancing right axillary adenopathy demonstrating perinodal infiltration in keeping with acute infection, inflammation or neoplastic infiltration. Asymmetric right axillary edema. Correlation with clinical examination including right breast examination is recommended. Electronically Signed   By: AFidela SalisburyM.D.   On: 09/17/2021 04:34    Microbiology: Results for orders placed or performed during the hospital encounter of 09/15/21  Blood culture (routine x  2)     Status: Abnormal   Collection Time: 09/17/21  5:12 AM   Specimen: BLOOD  Result Value Ref Range Status   Specimen Description   Final    BLOOD BLOOD LEFT WRIST Performed at [facility], [address] [place], [place] [address]    Special Requests   Final    BOTTLES DRAWN AEROBIC AND ANAEROBIC Blood Culture  adequate volume Performed at [facility], [address] [place], [place] [address]    Culture  Setup Time   Final    GRAM POSITIVE COCCI IN BOTH AEROBIC AND ANAEROBIC BOTTLES CRITICAL RESULT CALLED TO, READ BACK BY AND VERIFIED WITH: BRANDON BEERS @ 1822 09/17/21 [facility] GRAM STAIN REVIEWED-AGREE WITH RESULT    Culture (A)  Final    STAPHYLOCOCCUS AUREUS SUSCEPTIBILITIES PERFORMED ON PREVIOUS CULTURE WITHIN THE LAST 5 DAYS. Performed at [facility], [place] [address] [place], [place] [address]    Report Status 09/20/2021 FINAL  Final  Blood culture (routine x 2)     Status: Abnormal   Collection Time: 09/17/21  5:12 AM   Specimen: BLOOD  Result Value Ref Range Status   Specimen Description   Final    BLOOD BLOOD LEFT FOREARM Performed at [facility], [address] [place], [place] [address]    Special Requests   Final    BOTTLES DRAWN AEROBIC AND ANAEROBIC Blood Culture results may not be optimal due to an inadequate volume of blood received in culture bottles Performed at [facility], [address] [place], [place] [address]    Culture  Setup Time   Final    Organism ID to follow GRAM POSITIVE COCCI IN BOTH AEROBIC AND ANAEROBIC BOTTLES CRITICAL RESULT CALLED TO, READ BACK BY AND VERIFIED WITH: BRANDON BEERS _0  09/17/21 [facility] GRAM STAIN REVIEWED-AGREE WITH RESULT Performed at [facility], [place] [address], [place], [place] [address]    Culture METHICILLIN RESISTANT STAPHYLOCOCCUS AUREUS (A)  Final   Report Status 09/20/2021 FINAL  Final   Organism ID, Bacteria METHICILLIN RESISTANT STAPHYLOCOCCUS AUREUS  Final      Susceptibility   Methicillin resistant staphylococcus aureus - MIC*    CIPROFLOXACIN >=8 RESISTANT Resistant     ERYTHROMYCIN >=8 RESISTANT Resistant     GENTAMICIN <=0.5 SENSITIVE Sensitive     OXACILLIN >=4 RESISTANT Resistant     TETRACYCLINE <=1 SENSITIVE Sensitive     VANCOMYCIN 1 SENSITIVE Sensitive      TRIMETH/SULFA <=10 SENSITIVE Sensitive     CLINDAMYCIN <=0.25 SENSITIVE Sensitive     RIFAMPIN <=0.5 SENSITIVE Sensitive     Inducible Clindamycin NEGATIVE Sensitive     * METHICILLIN RESISTANT STAPHYLOCOCCUS AUREUS  Blood Culture ID Panel (Reflexed)     Status: Abnormal   Collection Time: 09/17/21  5:12 AM  Result Value Ref Range Status   Enterococcus faecalis NOT DETECTED NOT DETECTED Final   Enterococcus Faecium NOT DETECTED NOT DETECTED Final   Listeria monocytogenes NOT DETECTED NOT DETECTED Final   Staphylococcus species DETECTED (A) NOT DETECTED Final    Comment: CRITICAL RESULT CALLED TO, READ BACK BY AND VERIFIED WITH: BRANDON BEERS ON 09/17/21 AT 1822 LFD    Staphylococcus aureus (BCID) DETECTED (A) NOT DETECTED Final    Comment: Methicillin (oxacillin)-resistant Staphylococcus aureus (MRSA). MRSA is predictably resistant to beta-lactam antibiotics (except ceftaroline). Preferred therapy is vancomycin unless clinically contraindicated. Patient requires contact precautions if  hospitalized. CRITICAL RESULT CALLED TO, READ BACK BY AND VERIFIED  WITH: BRANDON BEERS @ 8469 09/17/21 LFD    Staphylococcus epidermidis NOT DETECTED NOT DETECTED Final   Staphylococcus lugdunensis NOT DETECTED NOT DETECTED Final   Streptococcus species NOT DETECTED NOT DETECTED Final   Streptococcus agalactiae NOT DETECTED NOT DETECTED Final   Streptococcus pneumoniae NOT DETECTED NOT DETECTED Final   Streptococcus pyogenes NOT DETECTED NOT DETECTED Final   A.calcoaceticus-baumannii NOT DETECTED NOT DETECTED Final   Bacteroides fragilis NOT DETECTED NOT DETECTED Final   Enterobacterales NOT DETECTED NOT DETECTED Final   Enterobacter cloacae complex NOT DETECTED NOT DETECTED Final   Escherichia coli NOT DETECTED NOT DETECTED Final   Klebsiella aerogenes NOT DETECTED NOT DETECTED Final   Klebsiella oxytoca NOT DETECTED NOT DETECTED Final   Klebsiella pneumoniae NOT DETECTED NOT DETECTED Final   Proteus  species NOT DETECTED NOT DETECTED Final   Salmonella species NOT DETECTED NOT DETECTED Final   Serratia marcescens NOT DETECTED NOT DETECTED Final   Haemophilus influenzae NOT DETECTED NOT DETECTED Final   Neisseria meningitidis NOT DETECTED NOT DETECTED Final   Pseudomonas aeruginosa NOT DETECTED NOT DETECTED Final   Stenotrophomonas maltophilia NOT DETECTED NOT DETECTED Final   Candida albicans NOT DETECTED NOT DETECTED Final   Candida auris NOT DETECTED NOT DETECTED Final   Candida glabrata NOT DETECTED NOT DETECTED Final   Candida krusei NOT DETECTED NOT DETECTED Final   Candida parapsilosis NOT DETECTED NOT DETECTED Final   Candida tropicalis NOT DETECTED NOT DETECTED Final   Cryptococcus neoformans/gattii NOT DETECTED NOT DETECTED Final   Meth resistant mecA/C and MREJ DETECTED (A) NOT DETECTED Final    Comment: CRITICAL RESULT CALLED TO, READ BACK BY AND VERIFIED WITH: BRANDON BEERS @ 1822 09/17/21 LFD Performed at [facility], [address] [place], [place] [address]   Expectorated Sputum Assessment w Gram Stain, Rflx to Resp Cult     Status: None   Collection Time: 09/17/21  8:57 AM   Specimen: Expectorated Sputum  Result Value Ref Range Status   Specimen Description EXPECTORATED SPUTUM  Final   Special Requests NONE  Final   Sputum evaluation   Final    THIS SPECIMEN IS ACCEPTABLE FOR SPUTUM CULTURE Performed at [facility], [address] [place], [place] [address]    Report Status 09/17/2021 FINAL  Final  Resp Panel by RT-PCR (Flu A&B, Covid) Nasopharyngeal Swab     Status: None   Collection Time: 09/17/21  8:57 AM   Specimen: Nasopharyngeal Swab; Nasopharyngeal(NP) swabs in vial transport medium  Result Value Ref Range Status   SARS Coronavirus 2 by RT PCR NEGATIVE NEGATIVE Final    Comment: (NOTE) SARS-CoV-2 target nucleic acids are NOT DETECTED.  The SARS-CoV-2 RNA is generally detectable in upper respiratory specimens during the acute  phase of infection. The lowest concentration of SARS-CoV-2 viral copies this assay can detect is 138 copies/mL. A negative result does not preclude SARS-Cov-2 infection and should not be used as the sole basis for treatment or other patient management decisions. A negative result may occur with  improper specimen collection/handling, submission of specimen other than nasopharyngeal swab, presence of viral mutation(s) within the areas targeted by this assay, and inadequate number of viral copies(<138 copies/mL). A negative result must be combined with clinical observations, patient history, and epidemiological information. The expected result is Negative.  Fact Sheet for Patients:  EntrepreneurPulse.com.au  Fact Sheet for Healthcare Providers:  IncredibleEmployment.be  This test is no t yet approved or cleared by the [place] and  has been authorized for detection and/or diagnosis of SARS-CoV-2 by FDA under an Emergency Use Authorization (EUA). This EUA will remain  in effect (meaning this test can be used) for the duration of the COVID-19 declaration under Section 564(b)(1) of the Act, 21 U.S.C.section 360bbb-3(b)(1), unless the authorization is terminated  or revoked sooner.       Influenza A by PCR NEGATIVE NEGATIVE Final   Influenza B by PCR NEGATIVE NEGATIVE Final    Comment: (NOTE) The Xpert Xpress SARS-CoV-2/FLU/RSV plus assay is intended as an aid in the diagnosis of influenza from Nasopharyngeal swab specimens and should not be used as a sole basis for treatment. Nasal washings and aspirates are unacceptable for Xpert Xpress SARS-CoV-2/FLU/RSV testing.  Fact Sheet for Patients: EntrepreneurPulse.com.au  Fact Sheet for Healthcare Providers: IncredibleEmployment.be  This test is not yet approved or cleared by the [place] FDA and has been authorized for detection and/or diagnosis of  SARS-CoV-2 by FDA under an Emergency Use Authorization (EUA). This EUA will remain in effect (meaning this test can be used) for the duration of the COVID-19 declaration under Section 564(b)(1) of the Act, 21 U.S.C. section 360bbb-3(b)(1), unless the authorization is terminated or revoked.  Performed at [facility], [address] [place], [place] [address]   Culture, Respiratory w Gram Stain     Status: None   Collection Time: 09/17/21  8:57 AM  Result Value Ref Range Status   Specimen Description   Final    EXPECTORATED SPUTUM Performed at [facility], [address] [place], [place] [address]    Special Requests   Final    NONE Reflexed from [id number] Performed at [place], [place] [address]    Gram Stain   Final    RARE NO ORGANISMS SEEN RARE GRAM POSITIVE COCCI IN PAIRS RARE GRAM VARIABLE ROD Performed at [facility], [place] [address], [place], [place] [address]    Culture FEW METHICILLIN RESISTANT STAPHYLOCOCCUS AUREUS  Final   Report Status 09/21/2021 FINAL  Final   Organism ID, Bacteria METHICILLIN RESISTANT STAPHYLOCOCCUS AUREUS  Final      Susceptibility   Methicillin resistant staphylococcus aureus - MIC*    CIPROFLOXACIN >=8 RESISTANT Resistant     ERYTHROMYCIN >=8 RESISTANT Resistant     GENTAMICIN <=0.5 SENSITIVE Sensitive     OXACILLIN >=4 RESISTANT Resistant     TETRACYCLINE <=1 SENSITIVE Sensitive     VANCOMYCIN <=0.5 SENSITIVE Sensitive     TRIMETH/SULFA <=10 SENSITIVE Sensitive     CLINDAMYCIN <=0.25 SENSITIVE Sensitive     RIFAMPIN <=0.5 SENSITIVE Sensitive     Inducible Clindamycin NEGATIVE Sensitive     * FEW METHICILLIN RESISTANT STAPHYLOCOCCUS AUREUS  Culture, blood (Routine X 2) w Reflex to ID Panel     Status: Abnormal   Collection Time: 09/19/21  5:06 AM   Specimen: BLOOD RIGHT HAND  Result Value Ref Range Status   Specimen Description   Final    BLOOD RIGHT HAND Performed at  [facility], [place] [address], [place], [place] [address]    Special Requests   Final    BOTTLES DRAWN AEROBIC AND ANAEROBIC Blood Culture results may not be optimal due to an inadequate volume of blood received in culture bottles Performed at [facility], [address] [place], [place] [address]    Culture  Setup Time   Final    GRAM POSITIVE COCCI ANAEROBIC BOTTLE ONLY Organism ID to  follow CRITICAL RESULT CALLED TO, READ BACK BY AND VERIFIED WITH: Margarette Canada, PHARMD AT 0932 ON 09/20/21 BY GM Performed at [facility], [address] [place], [place] [address]    Culture METHICILLIN RESISTANT STAPHYLOCOCCUS AUREUS (A)  Final   Report Status 09/22/2021 FINAL  Final   Organism ID, Bacteria METHICILLIN RESISTANT STAPHYLOCOCCUS AUREUS  Final      Susceptibility   Methicillin resistant staphylococcus aureus - MIC*    CIPROFLOXACIN >=8 RESISTANT Resistant     ERYTHROMYCIN >=8 RESISTANT Resistant     GENTAMICIN <=0.5 SENSITIVE Sensitive     OXACILLIN >=4 RESISTANT Resistant     TETRACYCLINE <=1 SENSITIVE Sensitive     VANCOMYCIN 1 SENSITIVE Sensitive     TRIMETH/SULFA <=10 SENSITIVE Sensitive     CLINDAMYCIN <=0.25 SENSITIVE Sensitive     RIFAMPIN <=0.5 SENSITIVE Sensitive     Inducible Clindamycin NEGATIVE Sensitive     * METHICILLIN RESISTANT STAPHYLOCOCCUS AUREUS  Blood Culture ID Panel (Reflexed)     Status: Abnormal   Collection Time: 09/19/21  5:06 AM  Result Value Ref Range Status   Enterococcus faecalis NOT DETECTED NOT DETECTED Final   Enterococcus Faecium NOT DETECTED NOT DETECTED Final   Listeria monocytogenes NOT DETECTED NOT DETECTED Final   Staphylococcus species DETECTED (A) NOT DETECTED Final    Comment: CRITICAL RESULT CALLED TO, READ BACK BY AND VERIFIED WITH: ELVIRIA MADUEME, PHARMD AT 0932 ON 09/20/21 BY GM    Staphylococcus aureus (BCID) DETECTED (A) NOT DETECTED Final    Comment: Methicillin (oxacillin)-resistant Staphylococcus  aureus (MRSA). MRSA is predictably resistant to beta-lactam antibiotics (except ceftaroline). Preferred therapy is vancomycin unless clinically contraindicated. Patient requires contact precautions if  hospitalized. CRITICAL RESULT CALLED TO, READ BACK BY AND VERIFIED WITH: ELVIRIA MADUEME, PHARMD AT 0932 ON 09/20/21 BY GM    Staphylococcus epidermidis NOT DETECTED NOT DETECTED Final   Staphylococcus lugdunensis NOT DETECTED NOT DETECTED Final   Streptococcus species NOT DETECTED NOT DETECTED Final   Streptococcus agalactiae NOT DETECTED NOT DETECTED Final   Streptococcus pneumoniae NOT DETECTED NOT DETECTED Final   Streptococcus pyogenes NOT DETECTED NOT DETECTED Final   A.calcoaceticus-baumannii NOT DETECTED NOT DETECTED Final   Bacteroides fragilis NOT DETECTED NOT DETECTED Final   Enterobacterales NOT DETECTED NOT DETECTED Final   Enterobacter cloacae complex NOT DETECTED NOT DETECTED Final   Escherichia coli NOT DETECTED NOT DETECTED Final   Klebsiella aerogenes NOT DETECTED NOT DETECTED Final   Klebsiella oxytoca NOT DETECTED NOT DETECTED Final   Klebsiella pneumoniae NOT DETECTED NOT DETECTED Final   Proteus species NOT DETECTED NOT DETECTED Final   Salmonella species NOT DETECTED NOT DETECTED Final   Serratia marcescens NOT DETECTED NOT DETECTED Final   Haemophilus influenzae NOT DETECTED NOT DETECTED Final   Neisseria meningitidis NOT DETECTED NOT DETECTED Final   Pseudomonas aeruginosa NOT DETECTED NOT DETECTED Final   Stenotrophomonas maltophilia NOT DETECTED NOT DETECTED Final   Candida albicans NOT DETECTED NOT DETECTED Final   Candida auris NOT DETECTED NOT DETECTED Final   Candida glabrata NOT DETECTED NOT DETECTED Final   Candida krusei NOT DETECTED NOT DETECTED Final   Candida parapsilosis NOT DETECTED NOT DETECTED Final   Candida tropicalis NOT DETECTED NOT DETECTED Final   Cryptococcus neoformans/gattii NOT DETECTED NOT DETECTED Final   Meth resistant mecA/C and  MREJ DETECTED (A) NOT DETECTED Final    Comment: CRITICAL RESULT CALLED TO, READ BACK BY AND VERIFIED WITH: ELVIRIA MADUEME, PHARMD AT 0932 ON 09/20/21 BY GM  Performed at [facility], [address] [place], [place] [address]   Culture, blood (Routine X 2) w Reflex to ID Panel     Status: None   Collection Time: 09/19/21  5:40 AM   Specimen: BLOOD  Result Value Ref Range Status   Specimen Description BLOOD RIGHT HAND  Final   Special Requests   Final    BOTTLES DRAWN AEROBIC AND ANAEROBIC Blood Culture adequate volume   Culture   Final    NO GROWTH 5 DAYS Performed at [facility], [address] [place], [place] [address]    Report Status 09/24/2021 FINAL  Final  MRSA Next Gen by PCR, Nasal     Status: Abnormal   Collection Time: 09/19/21  4:50 PM   Specimen: Nasal Mucosa; Nasal Swab  Result Value Ref Range Status   MRSA by PCR Next Gen DETECTED (A) NOT DETECTED Final    Comment: RESULT CALLED TO, READ BACK BY AND VERIFIED WITH: CRYSTAL BURNETT AT 1849 ON 09/19/21 BY SS (NOTE) The GeneXpert MRSA Assay (FDA approved for NASAL specimens only), is one component of a comprehensive MRSA colonization surveillance program. It is not intended to diagnose MRSA infection nor to guide or monitor treatment for MRSA infections. Test performance is not FDA approved in patients less than 59 years old. Performed at [facility], [address] [place], [place] [address]   Culture, blood (Routine X 2) w Reflex to ID Panel     Status: None   Collection Time: 09/22/21  9:50 PM   Specimen: BLOOD  Result Value Ref Range Status   Specimen Description BLOOD LEFT HAND  Final   Special Requests   Final    BOTTLES DRAWN AEROBIC AND ANAEROBIC Blood Culture adequate volume   Culture   Final    NO GROWTH 5 DAYS Performed at [facility], [address], [place], [place] [address]    Report Status 09/27/2021 FINAL  Final  Culture, blood (Routine X 2) w Reflex  to ID Panel     Status: Abnormal   Collection Time: 09/22/21 10:04 PM   Specimen: BLOOD  Result Value Ref Range Status   Specimen Description   Final    BLOOD LEFT HAND Performed at [facility], [address], [place], [place] [address]    Special Requests   Final    IN PEDIATRIC BOTTLE Blood Culture adequate volume Performed at [facility], [address] [place], [place] [address]    Culture  Setup Time   Final    GRAM POSITIVE COCCI IN PEDIATRIC BOTTLE CRITICAL RESULT CALLED TO, READ BACK BY AND VERIFIED WITH: LISA KLUTTZ AT 2134 ON 09/23/21 BY SS Performed at [facility], [address] [place], [place] [address]    Culture (A)  Final    STAPHYLOCOCCUS AUREUS SUSCEPTIBILITIES PERFORMED ON PREVIOUS CULTURE WITHIN THE LAST 5 DAYS. Performed at [facility], [place] [address], [place], [place] [address]    Report Status 09/25/2021 FINAL  Final  Culture, blood (Routine X 2) w Reflex to ID Panel     Status: Abnormal   Collection Time: 09/27/21  5:19 AM   Specimen: BLOOD  Result Value Ref Range Status   Specimen Description   Final    BLOOD LEFT [facility] Performed at [facility], [address] [place],  [address]    Special Requests   Final    BOTTLES DRAWN AEROBIC ONLY Blood Culture results may  not be optimal due to an inadequate volume of blood received in culture bottles Performed at [facility], [address] [place], [place] [address]    Culture  Setup Time   Final    GRAM POSITIVE COCCI AEROBIC BOTTLE ONLY Organism ID to follow CRITICAL RESULT CALLED TO, READ BACK BY AND VERIFIED WITH: ALEX CHAPPELL AT 1540 ON 09/28/21 BY SS Performed at [facility], [address] [place], [place] [address]    Culture (A)  Final    STAPHYLOCOCCUS EPIDERMIDIS THE SIGNIFICANCE OF ISOLATING THIS ORGANISM FROM A SINGLE SET OF BLOOD CULTURES WHEN MULTIPLE SETS ARE DRAWN IS UNCERTAIN. PLEASE NOTIFY THE  MICROBIOLOGY DEPARTMENT WITHIN ONE WEEK IF SPECIATION AND SENSITIVITIES ARE REQUIRED. Performed at [facility], [place] [address] [place], [place] [address]    Report Status 09/30/2021 FINAL  Final  Blood Culture ID Panel (Reflexed)     Status: Abnormal   Collection Time: 09/27/21  5:19 AM  Result Value Ref Range Status   Enterococcus faecalis NOT DETECTED NOT DETECTED Final   Enterococcus Faecium NOT DETECTED NOT DETECTED Final   Listeria monocytogenes NOT DETECTED NOT DETECTED Final   Staphylococcus species DETECTED (A) NOT DETECTED Final    Comment: CRITICAL RESULT CALLED TO, READ BACK BY AND VERIFIED WITH: ALEX CHAPPELL AT 1540 ON 09/28/21 BY SS    Staphylococcus aureus (BCID) NOT DETECTED NOT DETECTED Final   Staphylococcus epidermidis DETECTED (A) NOT DETECTED Final    Comment: Methicillin (oxacillin) resistant coagulase negative staphylococcus. Possible blood culture contaminant (unless isolated from more than one blood culture draw or clinical case suggests pathogenicity). No antibiotic treatment is indicated for blood  culture contaminants. CRITICAL RESULT CALLED TO, READ BACK BY AND VERIFIED WITH: ALEX CHAPPELL AT 2130 ON 09/28/21 BY SS    Staphylococcus lugdunensis NOT DETECTED NOT DETECTED Final   Streptococcus species NOT DETECTED NOT DETECTED Final   Streptococcus agalactiae NOT DETECTED NOT DETECTED Final   Streptococcus pneumoniae NOT DETECTED NOT DETECTED Final   Streptococcus pyogenes NOT DETECTED NOT DETECTED Final   A.calcoaceticus-baumannii NOT DETECTED NOT DETECTED Final   Bacteroides fragilis NOT DETECTED NOT DETECTED Final   Enterobacterales NOT DETECTED NOT DETECTED Final   Enterobacter cloacae complex NOT DETECTED NOT DETECTED Final   Escherichia coli NOT DETECTED NOT DETECTED Final   Klebsiella aerogenes NOT DETECTED NOT DETECTED Final   Klebsiella oxytoca NOT DETECTED NOT DETECTED Final   Klebsiella pneumoniae NOT DETECTED NOT DETECTED Final   Proteus  species NOT DETECTED NOT DETECTED Final   Salmonella species NOT DETECTED NOT DETECTED Final   Serratia marcescens NOT DETECTED NOT DETECTED Final   Haemophilus influenzae NOT DETECTED NOT DETECTED Final   Neisseria meningitidis NOT DETECTED NOT DETECTED Final   Pseudomonas aeruginosa NOT DETECTED NOT DETECTED Final   Stenotrophomonas maltophilia NOT DETECTED NOT DETECTED Final   Candida albicans NOT DETECTED NOT DETECTED Final   Candida auris NOT DETECTED NOT DETECTED Final   Candida glabrata NOT DETECTED NOT DETECTED Final   Candida krusei NOT DETECTED NOT DETECTED Final   Candida parapsilosis NOT DETECTED NOT DETECTED Final   Candida tropicalis NOT DETECTED NOT DETECTED Final   Cryptococcus neoformans/gattii NOT DETECTED NOT DETECTED Final   Methicillin resistance mecA/C DETECTED (A) NOT DETECTED Final    Comment: CRITICAL RESULT CALLED TO, READ BACK BY AND VERIFIED WITH: ALEX CHAPPELL AT 8657 ON 09/28/21 BY SS Performed at [facility], [address], [place], [place] [address]   Culture, blood (  Routine X 2) w Reflex to ID Panel     Status: None   Collection Time: 09/27/21  5:20 AM   Specimen: BLOOD  Result Value Ref Range Status   Specimen Description BLOOD LEFT HAND  Final   Special Requests   Final    BOTTLES DRAWN AEROBIC AND ANAEROBIC Blood Culture adequate volume   Culture   Final    NO GROWTH 5 DAYS Performed at [facility], [address] [place], [place] [address]    Report Status 10/02/2021 FINAL  Final  Aerobic/Anaerobic Culture w Gram Stain (surgical/deep wound)     Status: None   Collection Time: 09/28/21  1:16 PM   Specimen: PATH Other; Tissue  Result Value Ref Range Status   Specimen Description TISSUE  Final   Special Requests RIGHT ARM TISSUE  Final   Gram Stain   Final    NO SQUAMOUS EPITHELIAL CELLS SEEN FEW WBC SEEN NO ORGANISMS SEEN    Culture   Final    No growth aerobically or anaerobically. Performed at [place], [place] [address] [place], [place] [address]    Report Status 10/03/2021 FINAL  Final    Labs: CBC: Recent Labs  Lab 10/07/21 0637 10/09/21 0556 10/10/21 0602 10/12/21 0454 10/13/21 0619  WBC 4.3 5.0 4.5 4.2 4.4  HGB 8.2* 7.8* 8.2* 8.6* 8.9*  HCT 27.3* 26.6* 28.3* 29.2* 30.4*  MCV 76.9* 78.0* 77.5* 78.3* 76.8*  PLT 280 340 356 403* 188*   Basic Metabolic Panel: Recent Labs  Lab 10/09/21 0556 10/10/21 0602 10/12/21 0454 10/13/21 0619  NA 137 137 138 136  K 3.6 3.5 3.6 3.7  CL 107 108 108 108  CO2 _0 GLUCOSE 111* 90 90 100*  BUN _1 CREATININE 0.38* 0.33* 0.45 0.42*  CALCIUM 8.5* 8.5* 8.8* 9.0  MG 1.5* 1.5*  --  1.5*   Liver Function Tests: No results for input(s): "AST", "ALT", "ALKPHOS", "BILITOT", "PROT", "ALBUMIN" in the last 168 hours. CBG: No results for input(s): "GLUCAP" in the last 168 hours.  Discharge time spent: greater than 30 minutes.  Signed: Sharen Hones, MD [facility] 10/13/2021

## 2021-10-25 ENCOUNTER — Ambulatory Visit: Payer: Medicare Other | Attending: Infectious Diseases | Admitting: Infectious Diseases

## 2021-10-27 ENCOUNTER — Ambulatory Visit: Payer: Medicare Other | Attending: Infectious Diseases | Admitting: Infectious Diseases

## 2021-10-27 ENCOUNTER — Encounter: Payer: Self-pay | Admitting: Infectious Diseases

## 2021-10-27 ENCOUNTER — Telehealth: Payer: Self-pay

## 2021-10-27 DIAGNOSIS — R7881 Bacteremia: Secondary | ICD-10-CM | POA: Diagnosis present

## 2021-10-27 DIAGNOSIS — R918 Other nonspecific abnormal finding of lung field: Secondary | ICD-10-CM

## 2021-10-27 DIAGNOSIS — I890 Lymphedema, not elsewhere classified: Secondary | ICD-10-CM | POA: Insufficient documentation

## 2021-10-27 DIAGNOSIS — E876 Hypokalemia: Secondary | ICD-10-CM | POA: Diagnosis not present

## 2021-10-27 DIAGNOSIS — J15212 Pneumonia due to Methicillin resistant Staphylococcus aureus: Secondary | ICD-10-CM

## 2021-10-27 DIAGNOSIS — B9562 Methicillin resistant Staphylococcus aureus infection as the cause of diseases classified elsewhere: Secondary | ICD-10-CM | POA: Diagnosis present

## 2021-10-27 DIAGNOSIS — Z452 Encounter for adjustment and management of vascular access device: Secondary | ICD-10-CM | POA: Insufficient documentation

## 2021-10-27 DIAGNOSIS — Z86711 Personal history of pulmonary embolism: Secondary | ICD-10-CM | POA: Diagnosis not present

## 2021-10-27 DIAGNOSIS — D649 Anemia, unspecified: Secondary | ICD-10-CM | POA: Insufficient documentation

## 2021-10-27 MED ORDER — POTASSIUM CHLORIDE CRYS ER 20 MEQ PO TBCR: 20.0000 meq | EXTENDED_RELEASE_TABLET | Freq: Two times a day (BID) | ORAL | 0 refills | Status: AC

## 2021-10-27 NOTE — Telephone Encounter (Signed)
Krystal Francis with [facility] called regarding critical labs. Per Krystal Francis patient's potassium level drawn on 10/24/21 was 2.9. Patient will do a telephone visit with Dr. Delaine Lame today.Dr. Delaine Lame also aware of results

## 2021-10-27 NOTE — Progress Notes (Signed)
The purpose of this virtual visit is to provide medical care while limiting exposure to the novel coronavirus (COVID19) for both patient and office staff.   Consent was obtained for phone visit:  Yes.   Answered questions that patient had about telehealth interaction:  Yes.   I discussed the limitations, risks, security and privacy concerns of performing an evaluation and management service by telephone. I also discussed with the patient that there may be a patient responsible charge related to this service. The patient expressed understanding and agreed to proceed.   Patient Location: Home Provider Location: office Persons on the call- Dr.Anissa Abbs, patient and CMA  Pt was recently in the hospital between 09/15/21-10/13/21  for MRSA bacteremia, MRSA multifocal pneumonia and sepsis Here was concern for septic emboli to th lungs She also had rt upper extremity DVT and underwent thrombectomy as there was concern for seeding and persistent bacteremia  TEE done on 10/04/21 no vegetation Microbiology: 09/17/21 4/4 MRSA 09/19/21- MRSA bacteremia 2/4 blood culture 09/22/21 MRSA 09/27/2021: Blood cultures-no MRSA MRSA nares positive  She was discharged home afer a month to complete 6 weeks of Iv antibiotic on 11/03/21   She is doing better She has no specific complaints Her sister helps her with meds and she is administering IV antibiotic herself Last cr 0.6 Vanco level 21  Past Medical History:  Diagnosis Date   Anemia, unspecified    Hypertension    Morbid obesity Rt heart failure Acute hypoxic resp failure DVT Post menopausal bleeding Past Surgical History:  Procedure Laterality Date   DILATION AND CURETTAGE OF UTERUS     PERIPHERAL VASCULAR THROMBECTOMY Right 09/28/2021   Procedure: PERIPHERAL VASCULAR THROMBECTOMY;  Surgeon: Katha Cabal, MD;  Location: [facility];  Service: Cardiovascular;  Laterality: Right;   TEE WITHOUT CARDIOVERSION N/A 10/04/2021   Procedure:  TRANSESOPHAGEAL ECHOCARDIOGRAM (TEE);  Surgeon: Corey Skains, MD;  Location: [facility];  Service: Cardiovascular;  Laterality: N/A;   THROMBECTOMY BRACHIAL ARTERY Right 09/28/2021   Procedure: THROMBECTOMY RIGHT UPPER EXTREMITY;  Surgeon: Katha Cabal, MD;  Location: [facility];  Service: Vascular;  Laterality: Right;    Social History   Socioeconomic History   Marital status: Single    Spouse name: Not on file   Number of children: Not on file   Years of education: Not on file   Highest education level: Not on file  Occupational History   Not on file  Tobacco Use   Smoking status: Never   Smokeless tobacco: Never  Vaping Use   Vaping Use: Never used  Substance and Sexual Activity   Alcohol use: Never   Drug use: Never   Sexual activity: Never  Other Topics Concern   Not on file  Social History Narrative   Not on file   Social Determinants of Health   Financial Resource Strain: Not on file  Food Insecurity: Not on file  Transportation Needs: Not on file  Physical Activity: Not on file  Stress: Not on file  Social Connections: Not on file  Intimate Partner Violence: Not on file    Family History  Problem Relation Age of Onset   Hypertension Mother    Hypertension Father    Diabetes Mellitus I Father    Allergies  Allergen Reactions   Furosemide Rash    Per [facility] records - rash possibly to IV furosemide but tablets ok   Grass Extracts [Gramineae Pollens] Itching    Other reaction(s): Cough   Chlorhexidine Hives,  Dermatitis, Rash and Other (See Comments)    Blisters right after CHG wipe down   I? Current Outpatient Medications  Medication Sig Dispense Refill   enoxaparin (LOVENOX) 120 MG/0.8ML injection Inject 120 mg into the skin daily.     ferrous sulfate 325 (65 FE) MG EC tablet Take 1 tablet (325 mg total) by mouth 3 (three) times daily with meals. 90 tablet 3   magnesium oxide (MAG-OX) 400 (240 Mg) MG tablet Take 1 tablet by mouth daily.      medroxyPROGESTERone (PROVERA) 10 MG tablet Take 1 tablet (10 mg total) by mouth 3 (three) times daily.     pantoprazole (PROTONIX) 40 MG tablet Take 40 mg by mouth daily.     polyethylene glycol (MIRALAX / GLYCOLAX) 17 g packet Take 17 g by mouth 2 (two) times daily as needed for mild constipation or moderate constipation. 14 each 0   torsemide (DEMADEX) 20 MG tablet Take 20 mg by mouth daily.     vancomycin IVPB Inject 2,000 mg into the vein daily. Indication:  MRSA bacteremia and MRSA PNA First Dose: No Last Day of Therapy:  11/03/21 Labs - Monday:  CBC/D, CMP, and vancomycin trough (goal 15-17). Labs - Thursday BMP, vancomycin trough Method of administration:Elastomeric Method of administration may be changed at the discretion of the patient and/or caregiver's ability to self-administer the medication ordered.  Please pull PIC at completion of IV antibiotics Fax weekly labs to [phone] Call [phone] with any questions or concerns 42 Units 0   vitamin B-12 (CYANOCOBALAMIN) 1000 MCG tablet Take by mouth.     No current facility-administered medications for this visit.     Abtx:  Anti-infectives (From admission, onward)    None       REVIEW OF SYSTEMS:  Const: negative fever, negative chills, negative weight loss Eyes: negative diplopia or visual changes, negative eye pain ENT: negative coryza, negative sore throat Resp: negative cough, hemoptysis, dyspnea Cards: negative for chest pain, palpitations, lower extremity edema GU: negative for frequency, dysuria and hematuria GI: Negative for abdominal pain, diarrhea, bleeding, constipation Skin: negative for rash and pruritus Heme: negative for easy bruising and gum/nose bleeding MS: poor mobility Neurolo:negative for headaches, dizziness, vertigo, memory problems  Psych: negative for feelings of anxiety, depression  Endocrine: negative for thyroid, diabetes Allergy/Immunology-as above Objective:  NA Pertinent  Labs 10/24/21 K=2.9 Cr 0.87 Hb9.1 Plt 258 Vanco trough 21.9  ? Impression/Recommendation ?MRSA bacteremia with bilateral MRSA pneumonia.  On vancomycin for 6 weeks until 11/03/2021 TEE done on 10/04/21 no endocarditis  Hypokalemia The last potassium was 2.9.  Could be from torsemide We will send KCl 20 mEq twice daily for 5 days.  Labs to be done again next week.  We will send the information to her PCP  Right upper extremity thrombus status post thrombectomy Culture was negative  Allergic skin reaction to CHG  Recent PE with right ventricular strain at [facility]  Anemia with history of vaginal bleed on Provera.  Needed multiple transfusions 2 months ago  Lymphedema legs is on torsemide  Discussed the management with the patient in detail After she completes IV antibiotic on 11/03/2021 the PICC line will be removed. Total time spent on this telephone call was 25 minutes ? ?  Note:  This document was prepared using Dragon voice recognition software and may include unintentional dictation errors.

## 2021-10-31 ENCOUNTER — Telehealth: Payer: Self-pay

## 2021-10-31 NOTE — Telephone Encounter (Signed)
Malachy Mood, RN with [facility] called to report patient PICC line will flush but unable to draw back. Patient is still receiving abx but unable to get blood work. Malachy Mood stated that the patient is very obese, tried peripheral stick and it is hard to get blood from patient. Malachy Mood call back number is [phone]   Tomi Bamberger, CMA

## 2021-10-31 NOTE — Telephone Encounter (Signed)
Spoke with Lonn Georgia at [facility] with orders to do cathflow at home. Lonn Georgia will check patient insurance and have Jeani Hawking, Pharmacist call back with update. Leatrice Jewels, RMA

## 2021-11-03 NOTE — Telephone Encounter (Signed)
Per Lonn Georgia with [facility] patient insurance does not cover Cath flow. Will need to go to infusion center for this. Per [facility] does not do cath flow and will need to go to infusion center on [address]. Not able to reach patient regarding this. Home health nurse will stop by and see patient today to see if she is able to get blood work. Will call office to see if patient can set up appointment with infusion center. Leatrice Jewels, RMA

## 2021-11-03 NOTE — Telephone Encounter (Signed)
Called [facility] to follow up on cath flow orders. Secure message sent to home health team. Leatrice Jewels, RMA

## 2021-11-03 NOTE — Telephone Encounter (Signed)
Received call from [place], [facility] RN with [facility], she states she was able to obtain labs today via venipuncture.   Patient states she is unable to come to [facility] for appointment with infusion center.    Beryle Flock, RN

## 2021-11-23 ENCOUNTER — Encounter: Payer: Self-pay | Admitting: Vascular Surgery

## 2023-06-07 IMAGING — CT CT ANGIO CHEST
3 of 14 series · 13 of 46 positions shown · IV contrast (APPLIED)
Comparison: None Available.

CLINICAL DATA: Pulmonary embolism (PE) suspected, high prob.
Hematuria, dyspnea

EXAM:
CT ANGIOGRAPHY CHEST WITH CONTRAST
TECHNIQUE: Multidetector CT imaging of the chest was performed using the
standard protocol during bolus administration of intravenous
contrast. Multiplanar CT image reconstructions and MIPs were
obtained to evaluate the vascular anatomy.

[Series 5: lung · axial · 0.72mm/px · z∈[-521,-417]mm · 2 of 158 slices shown]
[im 53/158  soft-tissue]
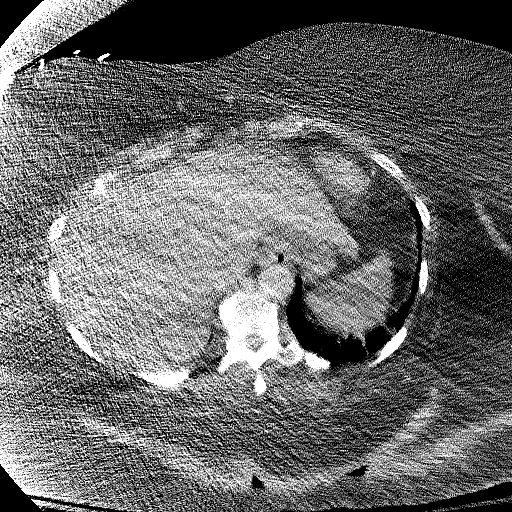
[im 105/158  soft-tissue]
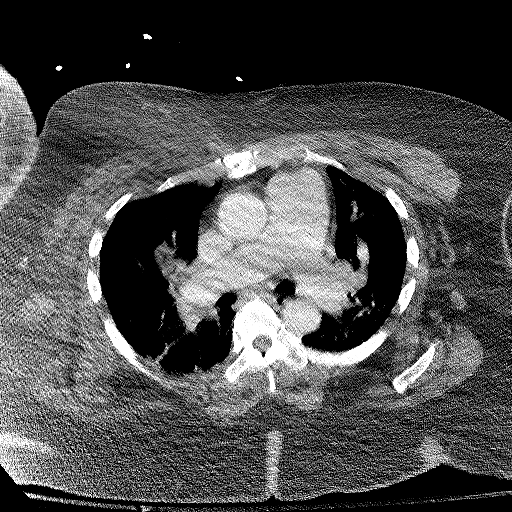

[Series 6: thins · axial · 0.72mm/px · z∈[-597,-340]mm · 10 of 450 slices shown]
[im 41/450  lung]
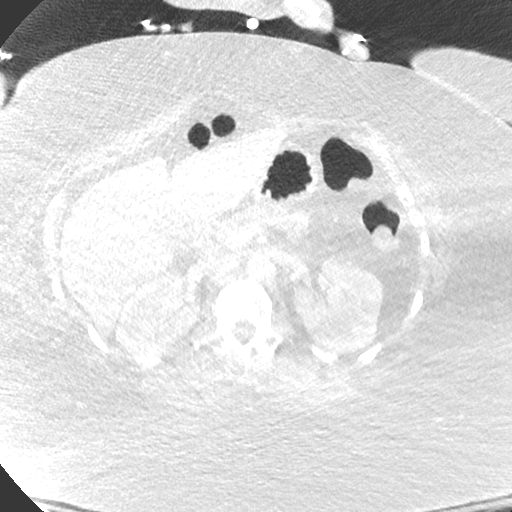
[im 82/450  soft-tissue]
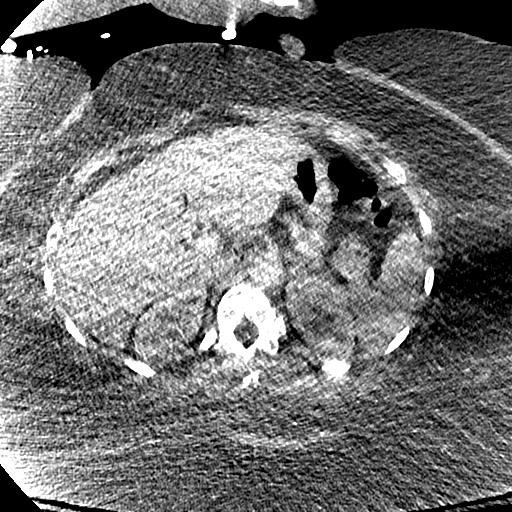
[im 123/450  lung]
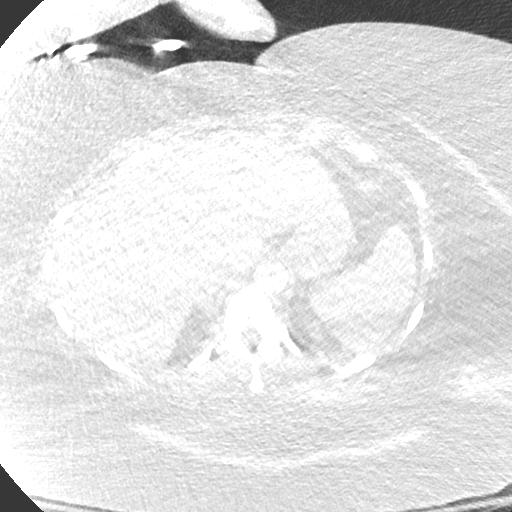
[im 164/450  soft-tissue]
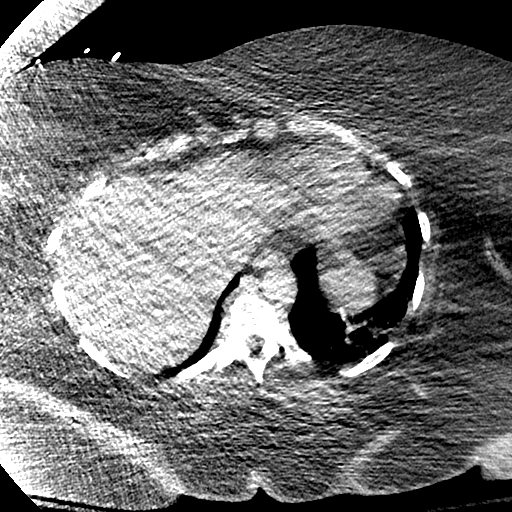
[im 205/450  lung]
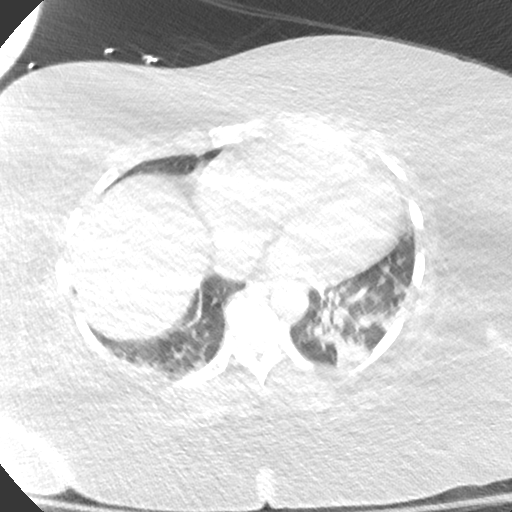
[im 245/450  soft-tissue]
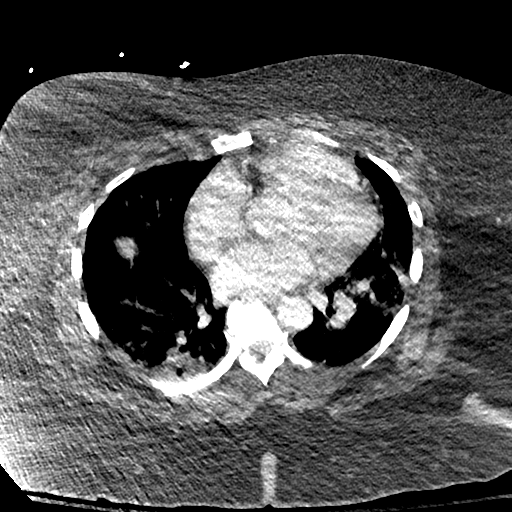
[im 286/450  lung]
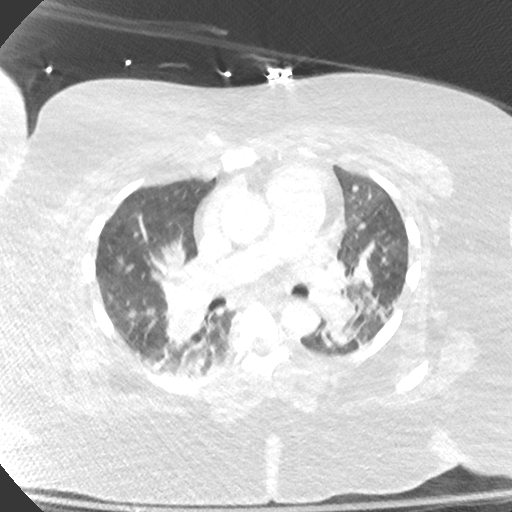
[im 327/450  soft-tissue]
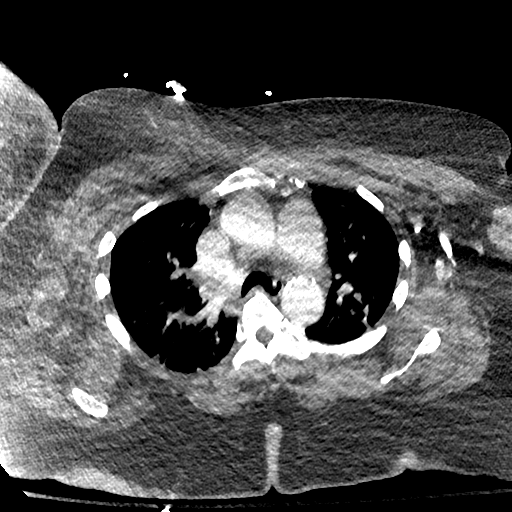
[im 368/450  lung]
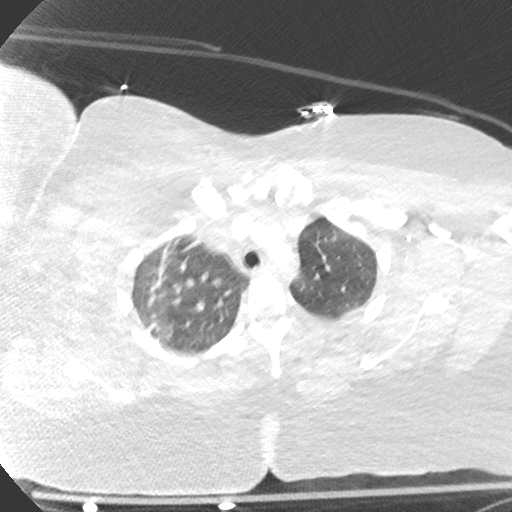
[im 409/450  soft-tissue]
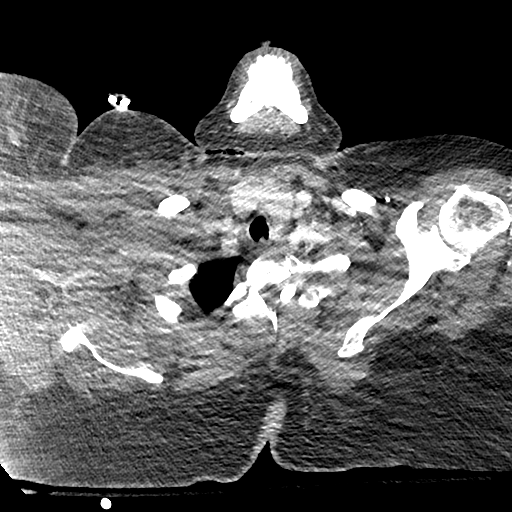

[Series 7: cor · coronal · 0.64mm/px · 1 of 158 slices shown]
[im 79/158  soft-tissue]
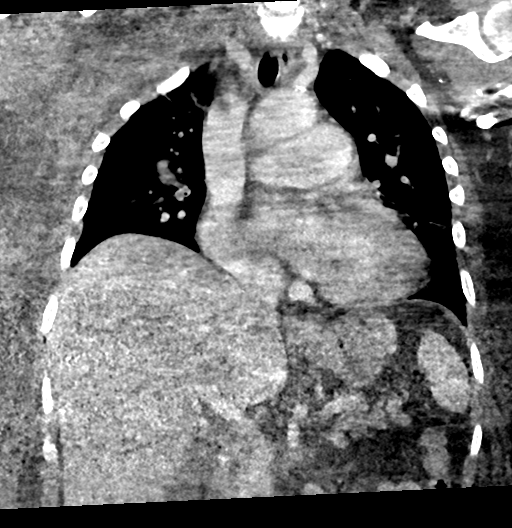

[13 of 46 positions shown; findings below may reference images not displayed]

RADIATION DOSE REDUCTION: This exam was performed according to the
departmental dose-optimization program which includes automated
exposure control, adjustment of the mA and/or kV according to
patient size and/or use of iterative reconstruction technique.

CONTRAST:  205mL OMNIPAQUE IOHEXOL 350 MG/ML SOLN (due to
requirement for second bolus)
FINDINGS: Cardiovascular: Technically limited examination due to bolus timing,
body habitus, and respiratory motion artifact. No intraluminal
filling defect identified within the main, right, and left pulmonary
arteries. Lobar, segmental, and subsegmental pulmonary arteries are
not adequately opacified to definitively exclude the presence of
acute pulmonary emboli. Central pulmonary arteries are enlarged in
keeping with changes of pulmonary arterial hypertension.

No significant coronary artery calcification. Global cardiac size
within normal limits. No pericardial effusion. Mild atherosclerotic
calcification within the thoracic aorta. No aortic aneurysm.

Mediastinum/Nodes: There is right axillary adenopathy identified as
well as numerous enhancing lymph nodes within the right axilla
demonstrating a matted appearance with perinodal infiltration likely
related to inflammation or nodal metastatic disease. There is
asymmetric edema within the right axilla noted. Right hilar
adenopathy is present, possibly reactive in nature. Visualized
thyroid is unremarkable. Esophagus is unremarkable.

Lungs/Pleura: There is multifocal consolidation within the lungs
bilaterally, in keeping with changes of acute multifocal pneumonia
in the appropriate clinical setting. No pneumothorax or pleural
effusion. Central airways are widely patent.

Upper Abdomen: No acute abnormality.

Musculoskeletal: No chest wall abnormality. No acute or significant
osseous findings.

Review of the MIP images confirms the above findings.
IMPRESSION: Technically limited examination without evidence of large central
pulmonary embolus. Lobar and peripheral pulmonary arterial
vasculature is not adequately opacified on this examination to
definitively exclude the presence of acute pulmonary embolus.

Morphologic changes in keeping with pulmonary arterial hypertension.

Multifocal pulmonary consolidation in keeping with multifocal
pneumonic consolidation in the appropriate clinical setting. No
central obstructing lesion. Probable reactive right hilar
adenopathy.

Enhancing right axillary adenopathy demonstrating perinodal
infiltration in keeping with acute infection, inflammation or
neoplastic infiltration. Asymmetric right axillary edema.
Correlation with clinical examination including right breast
examination is recommended.

## 2023-06-08 IMAGING — US US EXTREM LOW ARTERIAL SEG MULTIPLE*L*
1 series · 13 of 25 positions shown · non-contrast
Comparison: None Available.

CLINICAL DATA: Left lower extremity pain.  Evaluate for PAD.

EXAM:
LEFT LOWER EXTREMITY ARTERIAL DUPLEX SCAN
TECHNIQUE: Gray-scale sonography as well as color Doppler and duplex ultrasound
was performed to evaluate the lower extremity arteries including the
common, superficial and profunda femoral arteries, popliteal artery
and calf arteries.

[Series 1: us arterial lower extremity duplex left (non-abi) · arterial · 13 of 32 slices shown]
[im 1/32]
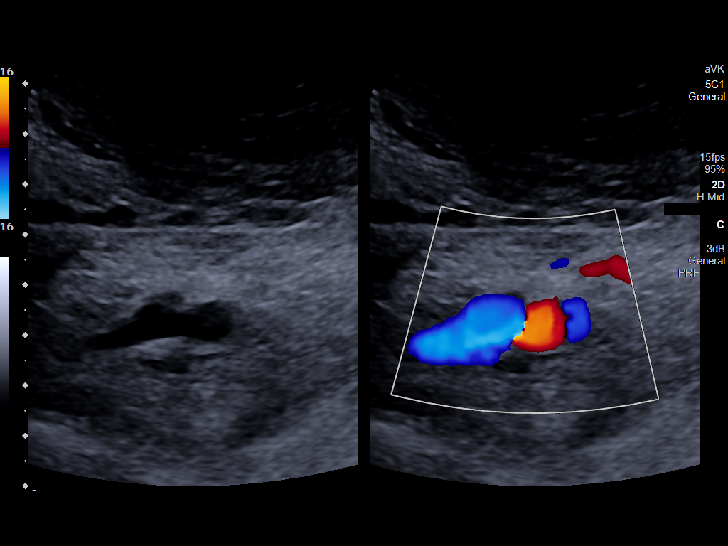
[im 3/32]
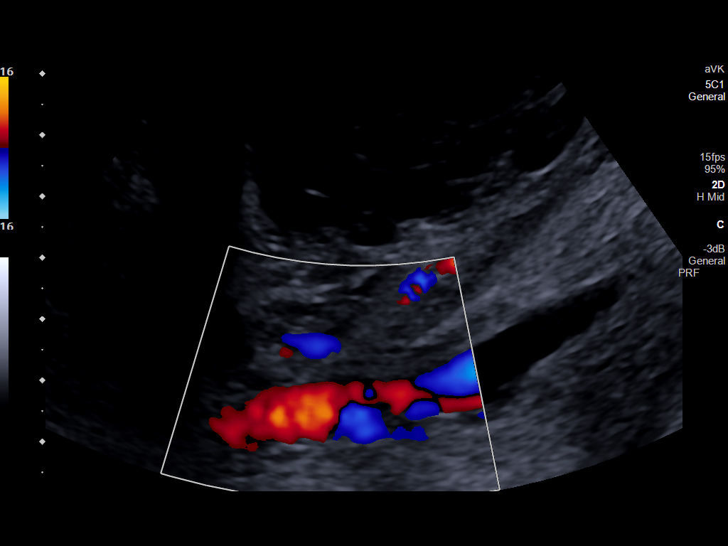
[im 6/32]
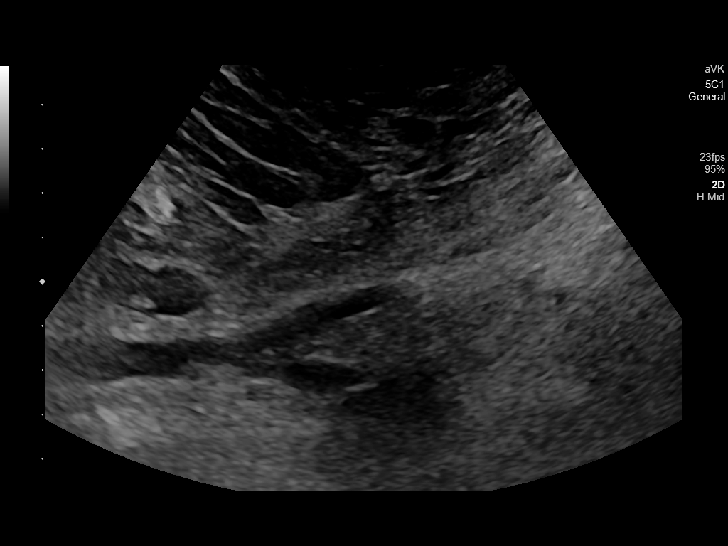
[im 8/32]
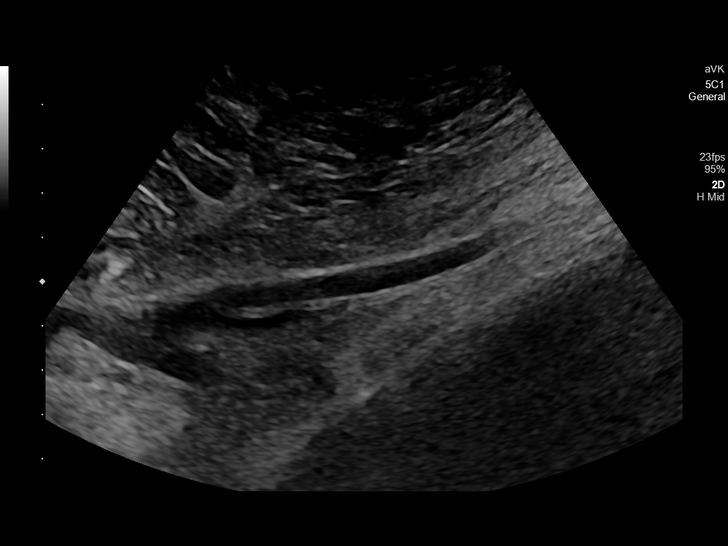
[im 11/32]
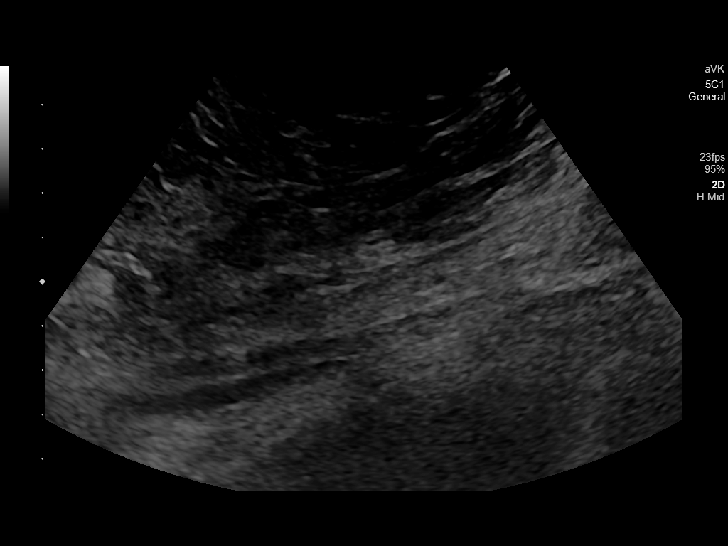
[im 13/32]
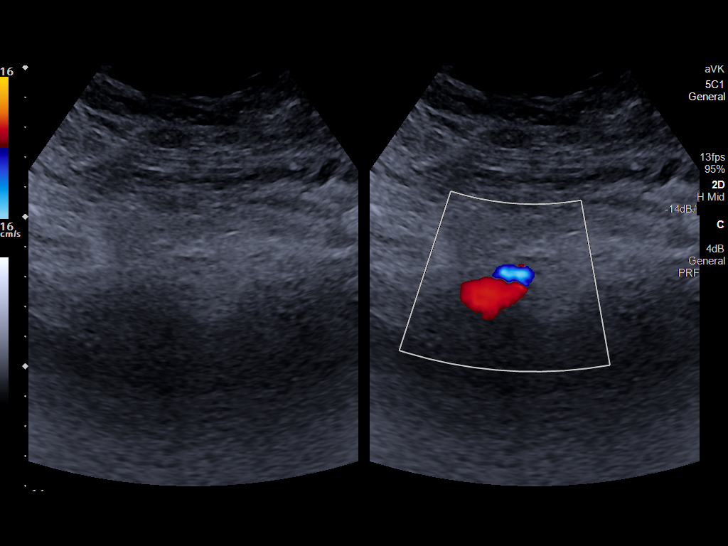
[im 16/32]
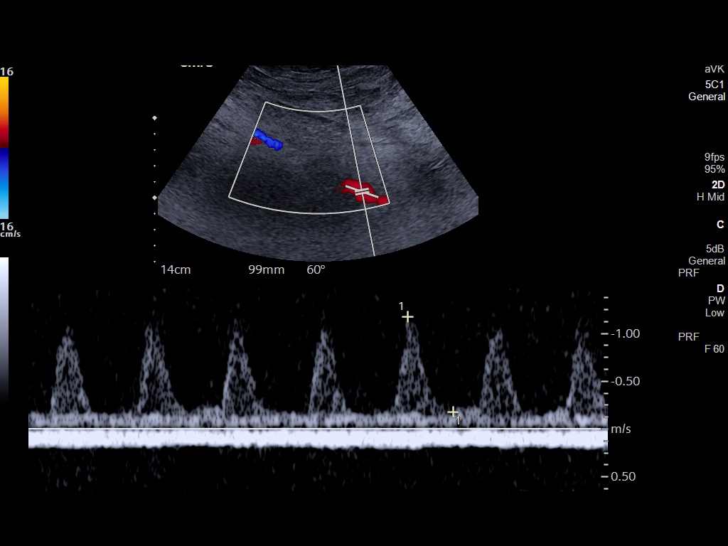
[im 19/32]
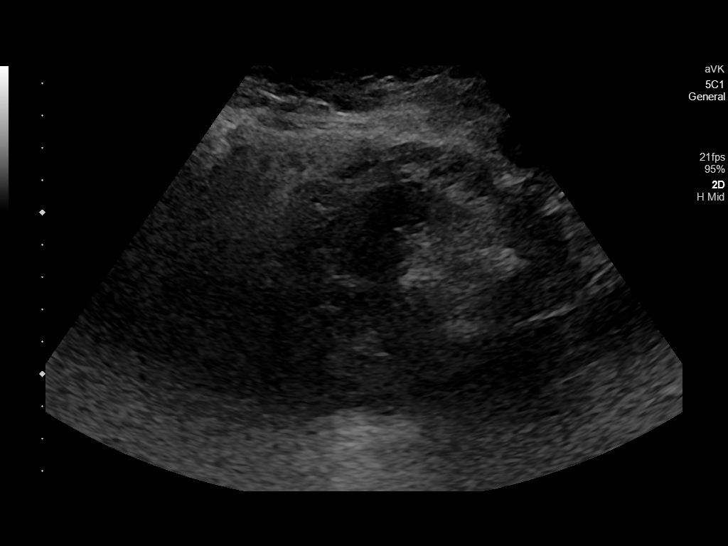
[im 21/32]
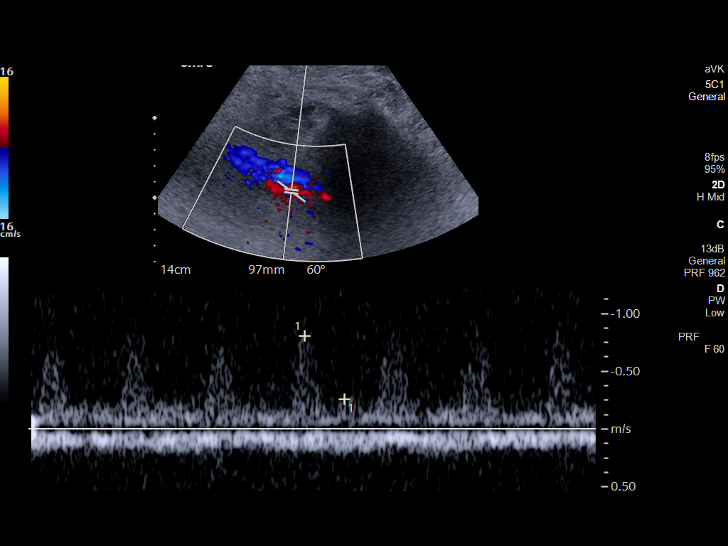
[im 24/32]
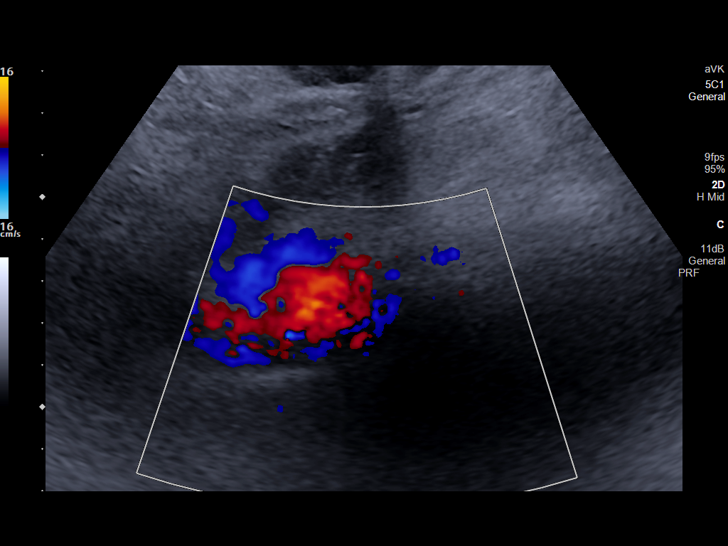
[im 26/32]
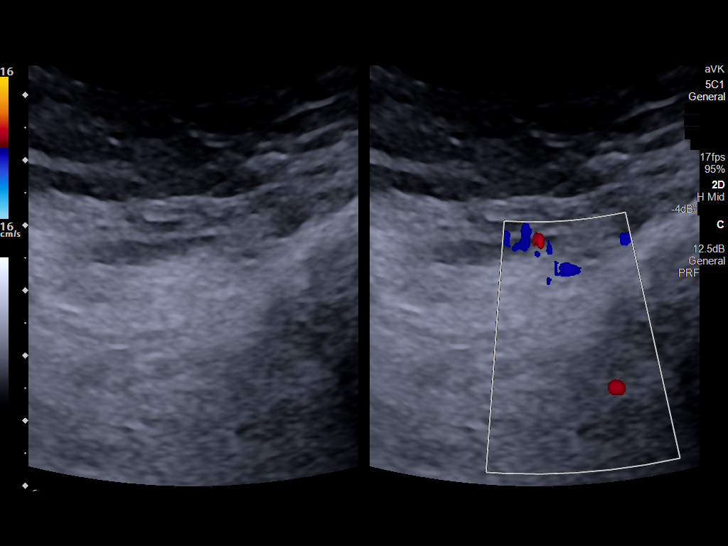
[im 29/32]
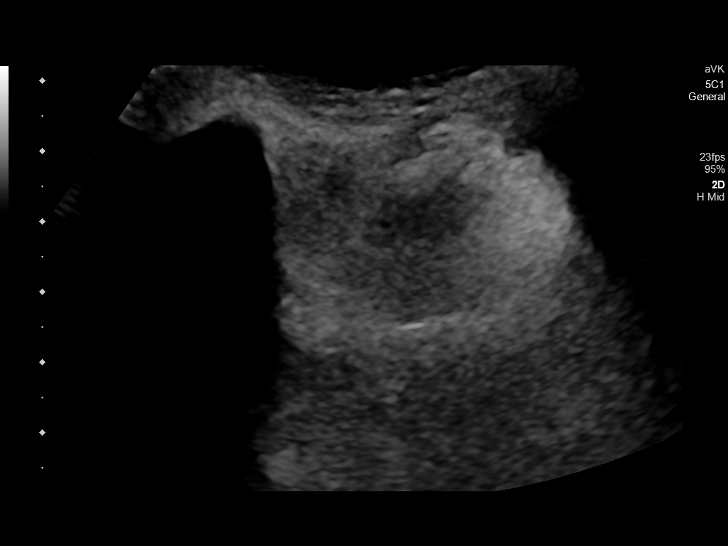
[im 32/32]
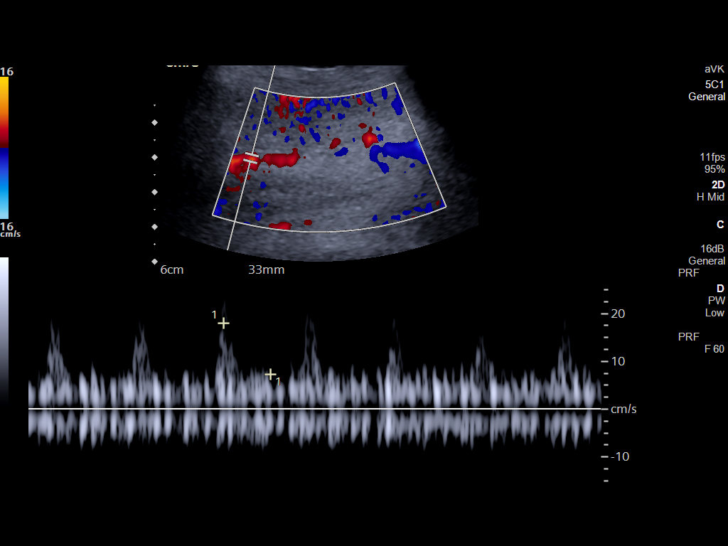

[13 of 25 positions shown; findings below may reference images not displayed]

FINDINGS: Left lower Extremity:

Left Common femoral artery: Triphasic waveform; 201 cm/sec

Normal waveform is demonstrated within the left common femoral
artery however there is borderline elevated peak systolic velocity
of 201 cm/sec, findings suggestive of potential inflow (aortoiliac
disease).

Left superficial femoral artery: Biphasic waveform

Proximal: 109 cm/sec

Mid: 118 cm/sec

Distal: 81 cm/sec

Left deep femoral artery: Biphasic waveform; 90 cm/sec

Left popliteal artery: Biphasic waveform; 78 cm/sec

Biphasic waveforms are demonstrated throughout the interrogated
portions of the left superficial, and deep femoral arteries as well
as the popliteal artery. Velocity differences between the mid and
distal aspects of the left superficial femoral artery suggests
hemodynamically significant narrowing regional to this location.

Left anterior tibial artery: Monophasic waveform; 18 cm/sec

Left posterior tibial artery: Biphasic waveform; 97 cm/sec

Tibial runoff is patent the level of the lower leg however blunted
monophasic waveform is demonstrated within the left anterior tibial
artery with associated markedly reduced velocity suggestive of
atretic flow. Biphasic waveform is demonstrated within the left
posterior tibial artery.
IMPRESSION: Suspected hemodynamically significant narrowings involving inflow,
outflow and tibial runoff of the left lower extremity. Further
evaluation with CTA run-off could be performed as indicated.

## 2023-06-10 IMAGING — US US EXTREM  UP VENOUS*R*
1 series · 13 of 24 positions shown · non-contrast
Comparison: None Available.

CLINICAL DATA: Right arm edema.



[Series 1: us venous img upper uni right (dvt) · portal-venous · 13 of 41 slices shown]
[im 1/41]
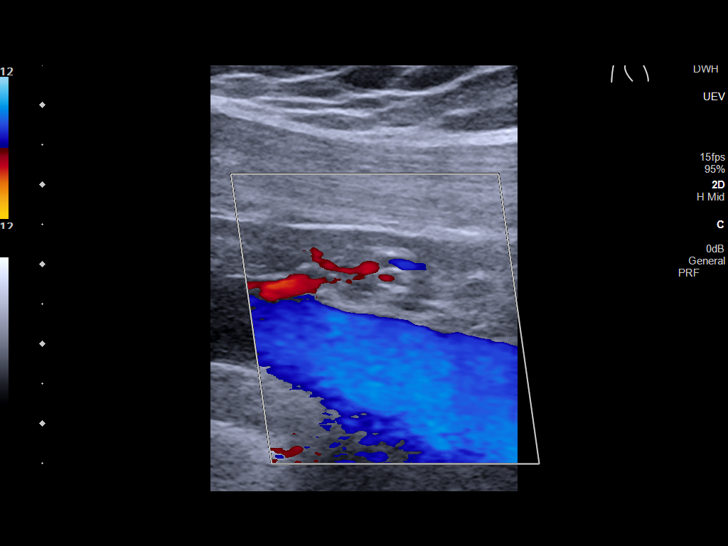
[im 4/41]
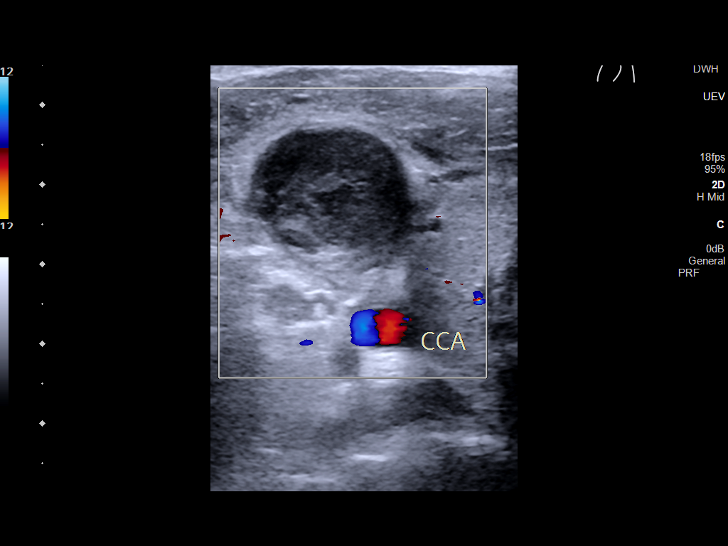
[im 7/41]
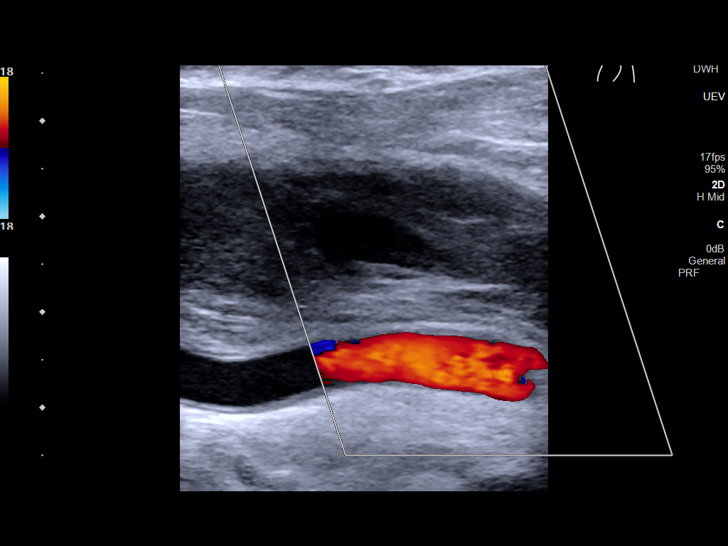
[im 11/41]
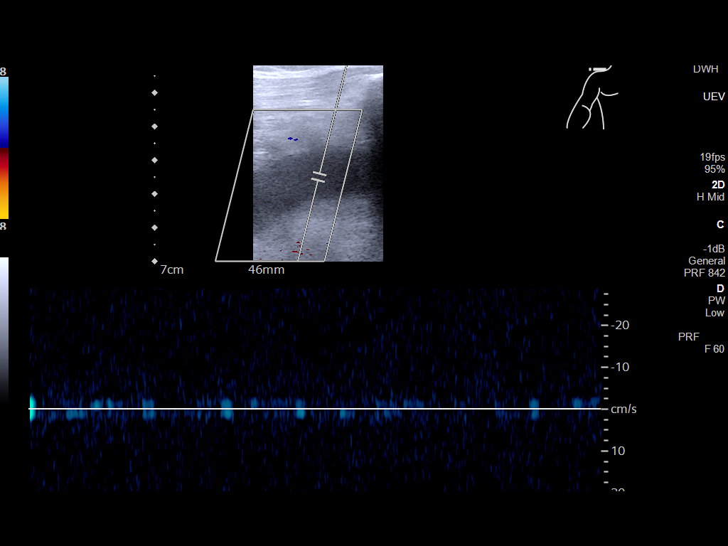
[im 14/41]
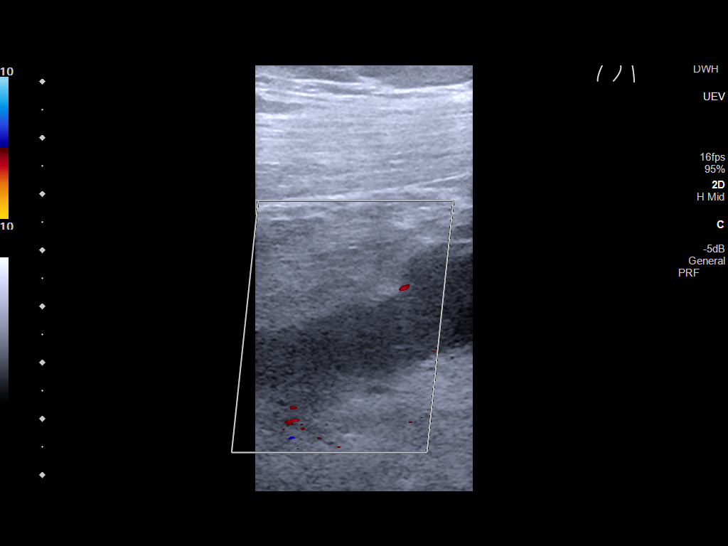
[im 18/41]
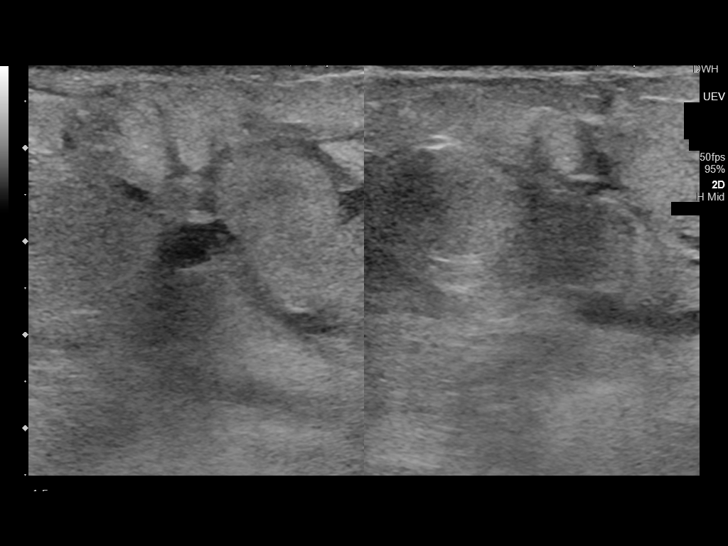
[im 21/41]
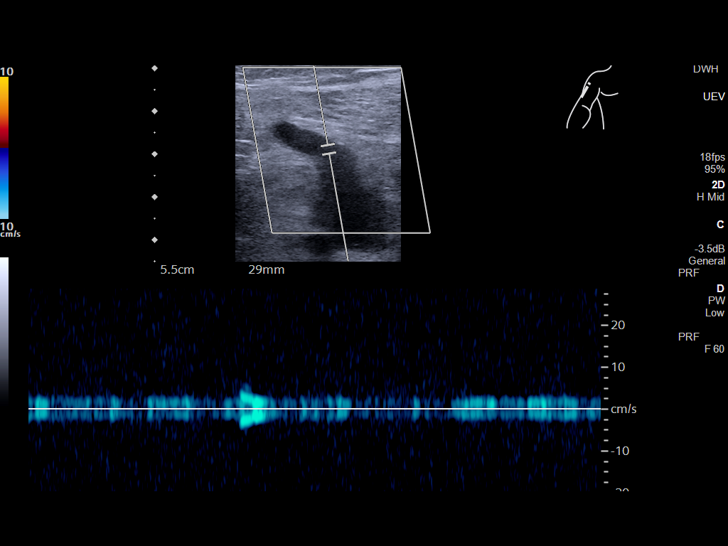
[im 23/41]
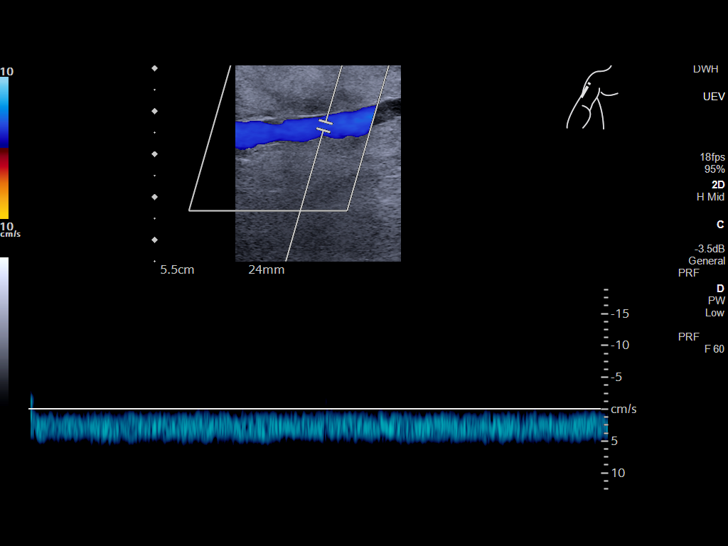
[im 27/41]
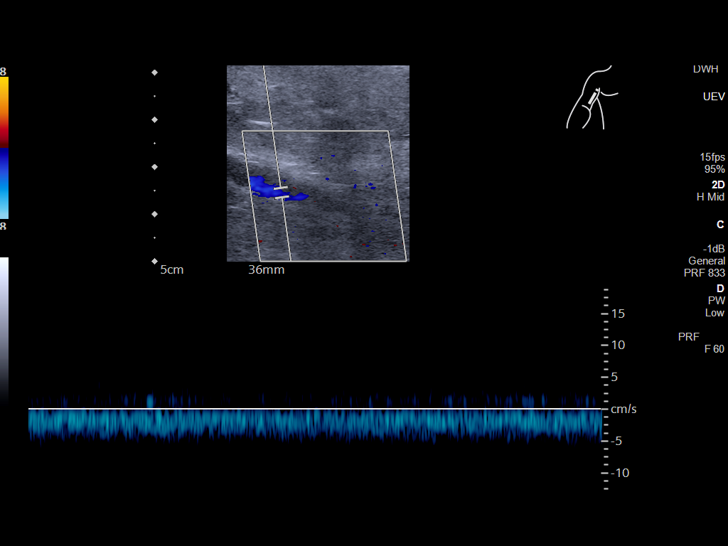
[im 30/41]
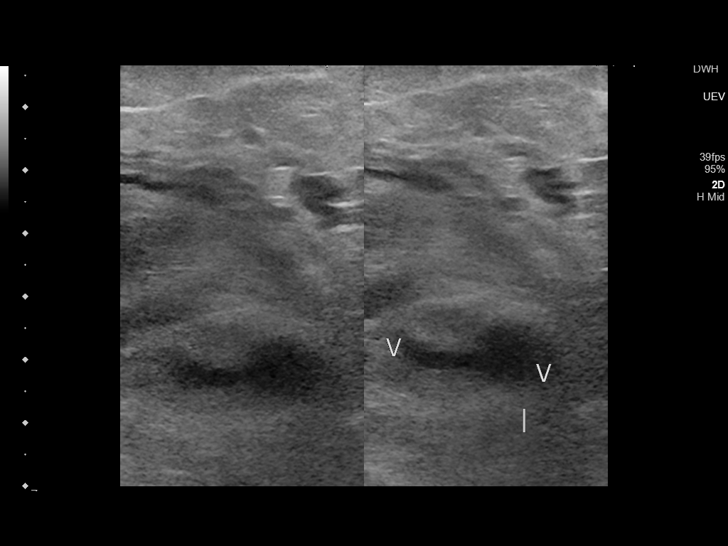
[im 34/41]
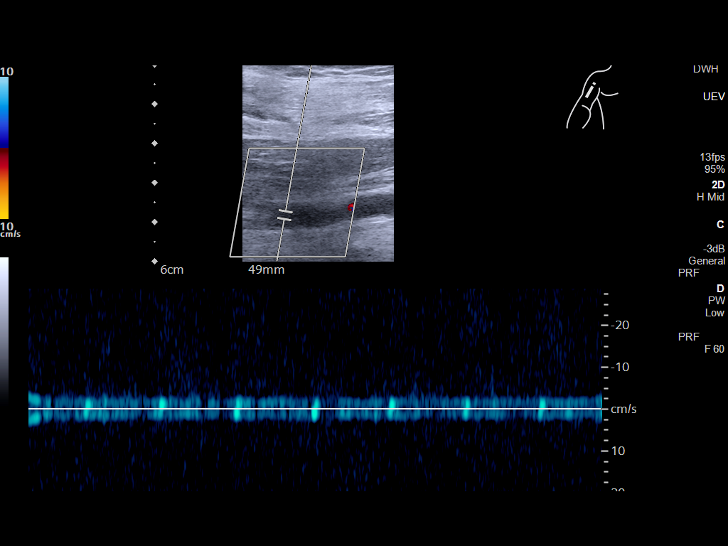
[im 37/41]
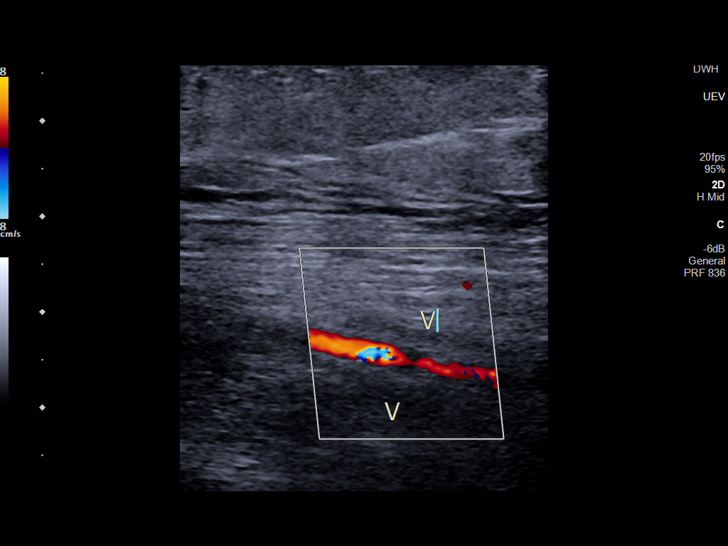
[im 41/41]
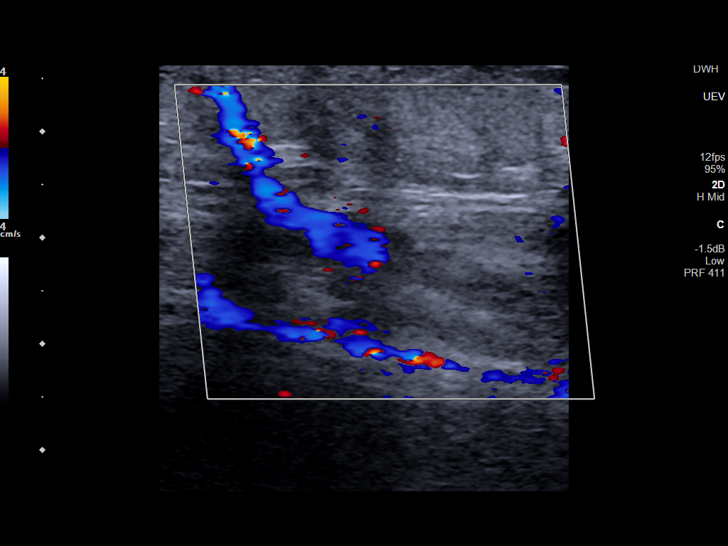

[13 of 24 positions shown; findings below may reference images not displayed]

FINDINGS: Contralateral Subclavian Vein: Respiratory phasicity is normal and
symmetric with the symptomatic side. No evidence of thrombus. Normal
compressibility.

Internal Jugular Vein: Occlusive thrombus.

Subclavian Vein: Occlusive thrombus.

Axillary Vein: Occlusive thrombus.

Cephalic Vein: Proximal occlusive thrombus.

Basilic Vein: Nearly occlusive thrombus with only trace residual
flow evident.

Brachial Veins: Occlusive thrombus.

Radial Veins: Occlusive thrombus.

Ulnar Veins: Diminutive but patent.
IMPRESSION: 1. Extensive occlusive thrombus in the right upper extremity as
above.

These results will be called to the ordering clinician or
representative by the Radiologist Assistant, and communication
documented in the PACS or [REDACTED].

## 2023-06-19 IMAGING — DX DG CHEST 1V PORT
1 series · 1 of 1 positions shown · non-contrast
Comparison: CTA chest 09/17/2021

CLINICAL DATA: Pneumonia.

EXAM:
PORTABLE CHEST 1 VIEW

[chest ap]
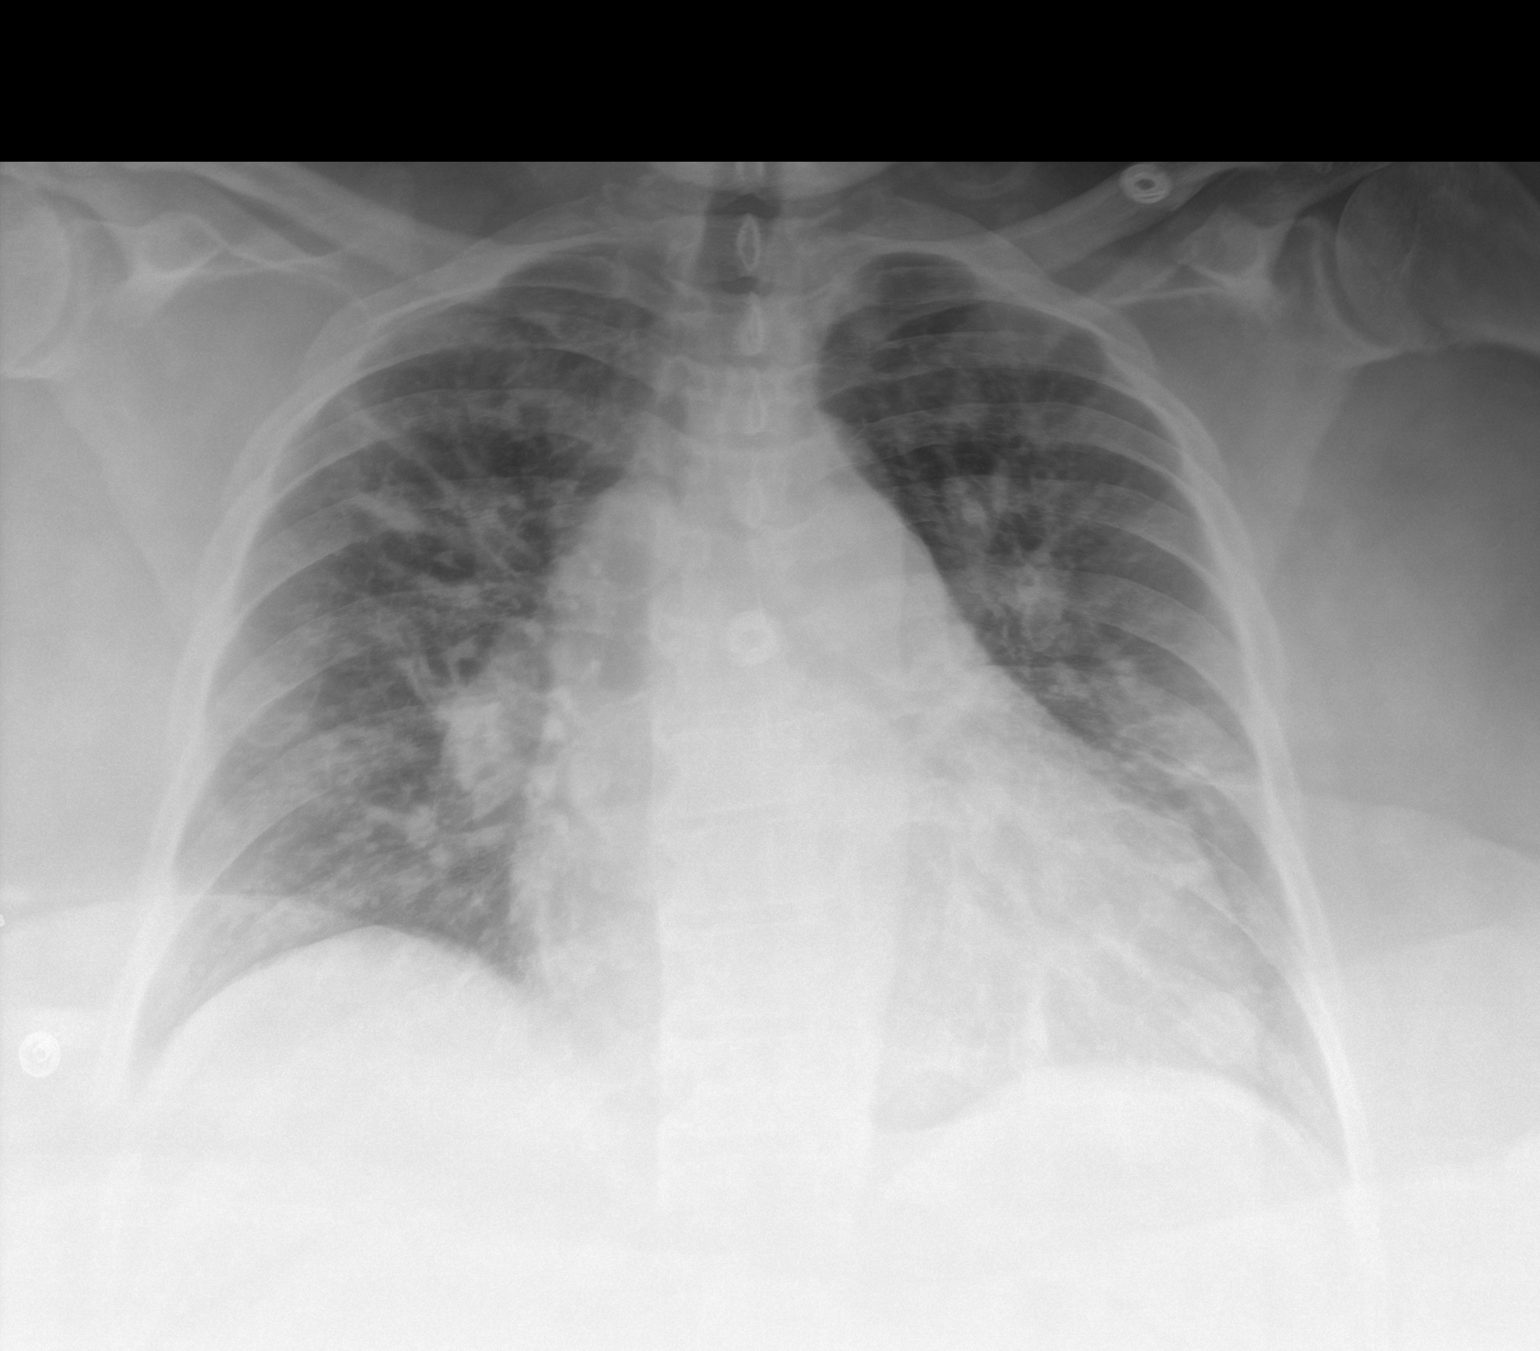

[1 of 1 positions shown; findings below may reference images not displayed]

FINDINGS: Cardiac silhouette is again mildly to moderately enlarged.
Mediastinal contours within normal limits. Mild bilateral
interstitial thickening. There patchy heterogeneous bilateral
airspace opacities that are persistent but likely slightly improved
from 09/17/2021 frontal scout view for prior chest CT. No pleural
effusion or pneumothorax.
IMPRESSION: 1. Stable mild cardiomegaly.
2. Bilateral patchy heterogeneous airspace opacities again
suspicious for multifocal infection. These may be slightly improved
from prior 09/17/2021 CT, however differences in modality limit
comparison.
